# Patient Record
Sex: Female | Born: 1965
Health system: Southern US, Community
[De-identification: ages and names within clinical notes are randomized; demographics above are authoritative.]

## PROBLEM LIST (undated history)

## (undated) DIAGNOSIS — F419 Anxiety disorder, unspecified: Secondary | ICD-10-CM

## (undated) DIAGNOSIS — F329 Major depressive disorder, single episode, unspecified: Secondary | ICD-10-CM

## (undated) DIAGNOSIS — M1990 Unspecified osteoarthritis, unspecified site: Secondary | ICD-10-CM

## (undated) DIAGNOSIS — D649 Anemia, unspecified: Secondary | ICD-10-CM

## (undated) DIAGNOSIS — T4145XA Adverse effect of unspecified anesthetic, initial encounter: Secondary | ICD-10-CM

## (undated) DIAGNOSIS — J3089 Other allergic rhinitis: Secondary | ICD-10-CM

## (undated) DIAGNOSIS — T8859XA Other complications of anesthesia, initial encounter: Secondary | ICD-10-CM

## (undated) DIAGNOSIS — G43909 Migraine, unspecified, not intractable, without status migrainosus: Secondary | ICD-10-CM

## (undated) DIAGNOSIS — D259 Leiomyoma of uterus, unspecified: Secondary | ICD-10-CM

## (undated) DIAGNOSIS — I7300 Raynaud's syndrome without gangrene: Secondary | ICD-10-CM

## (undated) DIAGNOSIS — R112 Nausea with vomiting, unspecified: Secondary | ICD-10-CM

## (undated) DIAGNOSIS — I83223 Varicose veins of left lower extremity with both ulcer of ankle and inflammation: Secondary | ICD-10-CM

## (undated) DIAGNOSIS — R42 Dizziness and giddiness: Secondary | ICD-10-CM

## (undated) DIAGNOSIS — L97329 Non-pressure chronic ulcer of left ankle with unspecified severity: Secondary | ICD-10-CM

## (undated) DIAGNOSIS — L508 Other urticaria: Secondary | ICD-10-CM

## (undated) DIAGNOSIS — Z9889 Other specified postprocedural states: Secondary | ICD-10-CM

## (undated) DIAGNOSIS — M810 Age-related osteoporosis without current pathological fracture: Secondary | ICD-10-CM

## (undated) DIAGNOSIS — K219 Gastro-esophageal reflux disease without esophagitis: Secondary | ICD-10-CM

## (undated) HISTORY — DX: Varicose veins of left lower extremity with both ulcer of ankle and inflammation: I83.223

## (undated) HISTORY — DX: Anemia, unspecified: D64.9

## (undated) HISTORY — DX: Other urticaria: L50.8

## (undated) HISTORY — DX: Leiomyoma of uterus, unspecified: D25.9

## (undated) HISTORY — PX: DENTAL SURGERY: SHX609

## (undated) HISTORY — DX: Major depressive disorder, single episode, unspecified: F32.9

## (undated) HISTORY — DX: Non-pressure chronic ulcer of left ankle with unspecified severity: L97.329

## (undated) HISTORY — PX: TUBAL LIGATION: SHX77

## (undated) HISTORY — PX: TONSILLECTOMY: SUR1361

## (undated) HISTORY — DX: Age-related osteoporosis without current pathological fracture: M81.0

## (undated) HISTORY — PX: HYSTEROSCOPY: SHX211

## (undated) HISTORY — DX: Raynaud's syndrome without gangrene: I73.00

## (undated) HISTORY — DX: Migraine, unspecified, not intractable, without status migrainosus: G43.909

## (undated) HISTORY — DX: Anxiety disorder, unspecified: F41.9

## (undated) HISTORY — PX: ADENOIDECTOMY: SUR15

---
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
## 2010-11-01 ENCOUNTER — Ambulatory Visit: Payer: Self-pay | Admitting: Internal Medicine

## 2010-11-18 ENCOUNTER — Ambulatory Visit: Payer: Self-pay | Admitting: Family Medicine

## 2010-11-18 DIAGNOSIS — J069 Acute upper respiratory infection, unspecified: Secondary | ICD-10-CM | POA: Insufficient documentation

## 2010-12-02 ENCOUNTER — Encounter (INDEPENDENT_AMBULATORY_CARE_PROVIDER_SITE_OTHER): Payer: Self-pay | Admitting: *Deleted

## 2010-12-08 ENCOUNTER — Ambulatory Visit: Payer: Self-pay | Admitting: Gastroenterology

## 2010-12-30 ENCOUNTER — Other Ambulatory Visit: Payer: Self-pay | Admitting: Internal Medicine

## 2010-12-30 ENCOUNTER — Ambulatory Visit
Admission: RE | Admit: 2010-12-30 | Discharge: 2010-12-30 | Payer: Self-pay | Source: Home / Self Care | Attending: Internal Medicine | Admitting: Internal Medicine

## 2010-12-30 LAB — BASIC METABOLIC PANEL
BUN: 18 mg/dL (ref 6–23)
CO2: 21 mEq/L (ref 19–32)
Calcium: 9 mg/dL (ref 8.4–10.5)
Chloride: 108 mEq/L (ref 96–112)
Creatinine, Ser: 0.7 mg/dL (ref 0.4–1.2)
GFR: 92.35 mL/min (ref >60.00)
Glucose, Bld: 79 mg/dL (ref 70–99)
Potassium: 4.7 mEq/L (ref 3.5–5.1)
Sodium: 136 mEq/L (ref 135–145)

## 2010-12-30 LAB — CONVERTED CEMR LAB
Bilirubin Urine: NEGATIVE
Blood in Urine, dipstick: NEGATIVE
Glucose, Urine, Semiquant: NEGATIVE
Ketones, urine, test strip: NEGATIVE
Nitrite: NEGATIVE
Protein, U semiquant: NEGATIVE
Specific Gravity, Urine: 1.02
Urobilinogen, UA: 0.2
WBC Urine, dipstick: NEGATIVE
pH: 6

## 2010-12-30 LAB — LIPID PANEL
Cholesterol: 162 mg/dL (ref 0–200)
HDL: 53.7 mg/dL (ref >39.00)
LDL Cholesterol: 99 mg/dL (ref 0–99)
Total CHOL/HDL Ratio: 3
Triglycerides: 48 mg/dL (ref 0.0–149.0)
VLDL: 9.6 mg/dL (ref 0.0–40.0)

## 2010-12-30 LAB — HEPATIC FUNCTION PANEL
ALT: 15 U/L (ref 0–35)
AST: 16 U/L (ref 0–37)
Albumin: 4.1 g/dL (ref 3.5–5.2)
Alkaline Phosphatase: 82 U/L (ref 39–117)
Bilirubin, Direct: 0.1 mg/dL (ref 0.0–0.3)
Total Bilirubin: 0.5 mg/dL (ref 0.3–1.2)
Total Protein: 7.1 g/dL (ref 6.0–8.3)

## 2010-12-30 LAB — CBC WITH DIFFERENTIAL/PLATELET
Basophils Absolute: 0 10*3/uL (ref 0.0–0.1)
Basophils Relative: 0.6 % (ref 0.0–3.0)
Eosinophils Absolute: 0 10*3/uL (ref 0.0–0.7)
Eosinophils Relative: 0.6 % (ref 0.0–5.0)
HCT: 36.3 % (ref 36.0–46.0)
Hemoglobin: 12.7 g/dL (ref 12.0–15.0)
Lymphocytes Relative: 36.2 % (ref 12.0–46.0)
Lymphs Abs: 1.6 10*3/uL (ref 0.7–4.0)
MCHC: 34.9 g/dL (ref 30.0–36.0)
MCV: 89.1 fl (ref 78.0–100.0)
Monocytes Absolute: 0.3 10*3/uL (ref 0.1–1.0)
Monocytes Relative: 7.1 % (ref 3.0–12.0)
Neutro Abs: 2.4 10*3/uL (ref 1.4–7.7)
Neutrophils Relative %: 55.5 % (ref 43.0–77.0)
Platelets: 258 10*3/uL (ref 150.0–400.0)
RBC: 4.08 Mil/uL (ref 3.87–5.11)
RDW: 14.1 % (ref 11.5–14.6)
WBC: 4.3 10*3/uL — ABNORMAL LOW (ref 4.5–10.5)

## 2010-12-30 LAB — TSH: TSH: 1.48 u[IU]/mL (ref 0.35–5.50)

## 2011-01-06 ENCOUNTER — Encounter: Payer: Self-pay | Admitting: Internal Medicine

## 2011-01-06 ENCOUNTER — Ambulatory Visit
Admission: RE | Admit: 2011-01-06 | Discharge: 2011-01-06 | Payer: Self-pay | Source: Home / Self Care | Attending: Internal Medicine | Admitting: Internal Medicine

## 2011-01-06 DIAGNOSIS — I7300 Raynaud's syndrome without gangrene: Secondary | ICD-10-CM

## 2011-01-06 HISTORY — DX: Raynaud's syndrome without gangrene: I73.00

## 2011-01-18 NOTE — Assessment & Plan Note (Signed)
Summary: TO BE EST/OK PER DOC/NJR   Vital Signs:  Patient profile:   45 year old female Height:      61 inches Weight:      108 pounds BMI:     20.48 Temp:     98.7 degrees F oral Pulse rate:   52 / minute Pulse rhythm:   regular Resp:     12 per minute BP sitting:   106 / 70  Vitals Entered By: Lynann Beaver CMA AAMA (November 01, 2010 2:36 PM) CC: To be established .......main complaint is migraines, Headache Is Patient Diabetic? No Pain Assessment Patient in pain? no       Does patient need assistance? Functional Status Self care Ambulation Normal Comments Pt complains of episodes of anemia.   Primary Care Provider:  Darryll Capers  CC:  To be established .......main complaint is migraines and Headache.  History of Present Illness: to establish transfering from [facility] New pt Hx of migraines, hx of anemia ( in the past) last diagnosed three years ago The pt has a hysteroscopy ( Dr Deatra Canter GYN) last pap was three year ago wil plan a pap at [facility] migraine pattern is perimentrual    Headache HPI:      The patient comes in for chronic management of headaches which have been unstable.  Since the last visit, the frequency of headaches have increased, and the intensity of the headaches have increased.  She has approximately 3 headaches per month.        The location of the headaches are unilateral-right.  Headache quality is throbbing or pulsating.  Precipitating factors consist of periovulatory.        Additional history: The week before mentraul cycle.     Preventive Screening-Counseling & Management  Alcohol-Tobacco     Smoking Status: never     Tobacco Counseling: not indicated; no tobacco use  Caffeine-Diet-Exercise     Does Patient Exercise: no      Drug Use:  no.    Problems Prior to Update: 1)  Family History of Colon Ca 1st Degree Relative <60  (ICD-V16.0) 2)  Anemia-nos  (ICD-285.9) 3)  Headache  (ICD-784.0)  Medications Prior to  Update: 1)  None  Current Medications (verified): 1)  Zomig 5 Mg Soln (Zolmitriptan) .... As Directed 2)  Topamax 50 Mg Tabs (Topiramate) .... One By Mouth Bid 3)  Skelaxin 800 Mg Tabs (Metaxalone) .... Prn 4)  Fluoxetine Hcl 10 Mg Caps (Fluoxetine Hcl) .... One By Mouth  10 Days Pre Menstraul Cycle For 14 Days 5)  Relpax 40 Mg Tabs (Eletriptan Hydrobromide) .... One By Mouth As Needed Head Aches  Allergies (verified): No Known Drug Allergies  Past History:  Family History: Last updated: 11/01/2010 Family History of Colon CA 1st degree relative <60 Family History of Cardiovascular disorder Family History Hypertension Family History of Arthritis  Social History: Last updated: 11/01/2010 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 Smoked Alcohol use-no Drug use-no Regular exercise-no  Risk Factors: Exercise: no (11/01/2010)  Risk Factors: Smoking Status: never (11/01/2010)  Past medical, surgical, family and social histories (including risk factors) reviewed, and no changes noted (except as noted below).  Past Medical History: Headache Anemia-NOS family hx of colon cancer in Maternal Grandmother  Past Surgical History: Tonsillectomy Caesarean section x 2 Hysteroscopy Tubal ligation  Family History: Reviewed history and no changes required. Family History of Colon CA 1st degree relative <60 Family History of Cardiovascular disorder Family History Hypertension Family History of  Arthritis  Social History: Reviewed history and no changes required. 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 Smoked Alcohol use-no Drug use-no Regular exercise-no Smoking Status:  never Drug Use:  no Does Patient Exercise:  no  Review of Systems  The patient denies anorexia, fever, weight loss, weight gain, vision loss, decreased hearing, hoarseness, chest pain, syncope, dyspnea on exertion, peripheral edema, prolonged cough, headaches, hemoptysis, abdominal pain, melena,  hematochezia, severe indigestion/heartburn, hematuria, incontinence, genital sores, muscle weakness, suspicious skin lesions, transient blindness, difficulty walking, depression, unusual weight change, abnormal bleeding, enlarged lymph nodes, angioedema, and breast masses.    Physical Exam  General:  alert, well-developed, and well-nourished.   Head:  normocephalic and atraumatic.   Eyes:  pupils equal and pupils round.   Ears:  R ear normal and L ear normal.   Nose:  no external deformity and no nasal discharge.   Neck:  No deformities, masses, or tenderness noted. Lungs:  normal respiratory effort and no crackles.   Heart:  normal rate and regular rhythm.   Abdomen:  Bowel sounds positive,abdomen soft and non-tender without masses, organomegaly or hernias noted. Msk:  No deformity or scoliosis noted of thoracic or lumbar spine.   Extremities:  No clubbing, cyanosis, edema, or deformity noted with normal full range of motion of all joints.   Neurologic:  alert & oriented X3.     Impression & Recommendations:  Problem # 1:  HEADACHE (ICD-784.0) has failed imitrex in the  past classic migrane with possible periouvlatory migraines on topamax 50 my two times a day but as a prain fog with this Her updated medication list for this problem includes:    Zomig 5 Mg Soln (Zolmitriptan) .Marland Kitchen... As directed    Relpax 40 Mg Tabs (Eletriptan hydrobromide) ..... One by mouth as needed head aches  Headache diary reviewed.  Problem # 2:  ANEMIA-NOS (ICD-285.9) hx of anemia ( iron) and family hx of colon cancer in grandmother Orders: Gastroenterology Referral (GI)  Complete Medication List: 1)  Zomig 5 Mg Soln (Zolmitriptan) .... As directed 2)  Topamax 50 Mg Tabs (Topiramate) .... One by mouth bid 3)  Skelaxin 800 Mg Tabs (Metaxalone) .... Prn 4)  Fluoxetine Hcl 10 Mg Caps (Fluoxetine hcl) .... One by mouth  10 days pre menstraul cycle for 14 days 5)  Relpax 40 Mg Tabs (Eletriptan hydrobromide)  .... One by mouth as needed head aches  Patient Instructions: 1)  [facility] Prescriptions: RELPAX 40 MG TABS (ELETRIPTAN HYDROBROMIDE) one by mouth as needed Head aches  #9 x 6   Entered and Authorized by:   Stacie Glaze MD   Signed by:   Stacie Glaze MD on 11/01/2010   Method used:   Electronically to        [facility] [phone]* (retail)       [address]       [place], [place]  [address]       Ph: [phone]       Fax: [phone]   RxID:   [id number] FLUOXETINE HCL 10 MG CAPS (FLUOXETINE HCL) one by mouth  10 days pre menstraul cycle for 14 days  #14 x 11   Entered and Authorized by:   Stacie Glaze MD   Signed by:   Stacie Glaze MD on 11/01/2010   Method used:   Electronically to        [facility] [phone]* (retail)       [address]  [place], [place]  [address]       Ph: [phone]       Fax: [phone]   RxID:   [id number]    Orders Added: 1)  Gastroenterology Referral [GI] 2)  New Patient Level II [84132]

## 2011-01-18 NOTE — Assessment & Plan Note (Signed)
Summary: FOR SINUS--DR.JENKINS PT//PH   Vital Signs:  Patient profile:   45 year old female Weight:      108.2 pounds O2 Sat:      97 % on Room air Temp:     98.9 degrees F oral Pulse rate:   85 / minute Pulse rhythm:   regular BP sitting:   110 / 70  (right arm) Cuff size:   regular  Vitals Entered By: Almeta Monas CMA Duncan Dull) (November 18, 2010 11:55 AM)  O2 Flow:  Room air CC: Since yesterday pt c/o sneezing, runny nose, post nasal drip, sinus pressure, headache and dry cough, URI symptoms   History of Present Illness:       This is a 45 year old woman who presents with URI symptoms.  The symptoms began 2 weeks ago.  Pt c/o runny nose and sneezing for 2 weeks--progressively worsening and now has sinus headache and dry cough that is worse at night.  The patient complains of nasal congestion, clear nasal discharge, sore throat, dry cough, and sick contacts.  The patient denies fever, low-grade fever (<100.5 degrees), fever of 100.5-103 degrees, fever of 103.1-104 degrees, fever to >104 degrees, stiff neck, dyspnea, wheezing, rash, vomiting, diarrhea, use of an antipyretic, and response to antipyretic.  The patient also reports sneezing.  The patient denies itchy watery eyes, itchy throat, seasonal symptoms, response to antihistamine, headache, muscle aches, and severe fatigue.  The patient denies the following risk factors for Strep sinusitis: unilateral facial pain, unilateral nasal discharge, poor response to decongestant, double sickening, tooth pain, Strep exposure, tender adenopathy, and absence of cough.    Current Medications (verified): 1)  Zomig 5 Mg Soln (Zolmitriptan) .... As Directed 2)  Topamax 50 Mg Tabs (Topiramate) .... One By Mouth Bid 3)  Skelaxin 800 Mg Tabs (Metaxalone) .... Prn 4)  Fluoxetine Hcl 10 Mg Caps (Fluoxetine Hcl) .... One By Mouth  10 Days Pre Menstraul Cycle For 14 Days 5)  Relpax 40 Mg Tabs (Eletriptan Hydrobromide) .... One By Mouth As Needed Head  Aches 6)  Promethazine Hcl 25 Mg Tabs (Promethazine Hcl) .... By Mouth As Needed 7)  Advil Cold/sinus 30-200 Mg Tabs (Pseudoephedrine-Ibuprofen) .Marland Kitchen.. 1 By Mouth Q12hours As Needed 8)  Augmentin 875-125 Mg Tabs (Amoxicillin-Pot Clavulanate) .Marland Kitchen.. 1 By Mouth Two Times A Day 9)  Cheratussin Ac 100-10 Mg/49ml Syrp (Guaifenesin-Codeine) .Marland Kitchen.. 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 Family History of Colon CA 1st degree relative <60 Family History of Cardiovascular disorder Family History Hypertension Family History of Arthritis  Social History: Reviewed history from 11/01/2010 and no changes required. 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  Otoscopic examination reveals clear canals, tympanic membranes are intact bilaterally without bulging, retraction, inflammation or discharge. Hearing is  grossly normal bilaterally. Nose:  no external deformity, no nasal discharge, mucosal erythema, and mucosal edema.   Mouth:  pharyngeal erythema and postnasal drip.   Neck:  No deformities, masses, or tenderness noted. Lungs:  Normal respiratory effort, chest expands symmetrically. Lungs are clear to auscultation, no crackles or wheezes. Heart:  normal rate and no murmur.   Cervical Nodes:  No lymphadenopathy noted Psych:  Cognition and judgment appear intact. Alert and cooperative with normal attention span and concentration. No apparent delusions, illusions, hallucinations   Impression & Recommendations:  Problem # 1:  URI (ICD-465.9)  Her updated medication list for this problem includes:    Promethazine Hcl 25 Mg Tabs (Promethazine hcl) ..... By mouth as needed    Advil Cold/sinus 30-200 Mg Tabs (Pseudoephedrine-ibuprofen) .Marland Kitchen... 1 by mouth q12hours as needed    Cheratussin Ac 100-10 Mg/29ml Syrp (Guaifenesin-codeine) .Marland Kitchen... 1-2 tsp by mouth at bedtime as needed cough  Instructed on symptomatic treatment. Call if symptoms persist or worsen.   Complete Medication List: 1)  Zomig 5 Mg Soln (Zolmitriptan) .... As directed 2)  Topamax 50 Mg Tabs (Topiramate) .... One by mouth bid 3)  Skelaxin 800 Mg Tabs (Metaxalone) .... Prn 4)  Fluoxetine Hcl 10 Mg Caps (Fluoxetine hcl) .... One by mouth  10 days pre menstraul cycle for 14 days 5)  Relpax 40 Mg Tabs (Eletriptan hydrobromide) .... One by mouth as needed head aches 6)  Promethazine Hcl 25 Mg Tabs (Promethazine hcl) .... By mouth as needed 7)  Advil Cold/sinus 30-200 Mg Tabs (Pseudoephedrine-ibuprofen) .Marland Kitchen.. 1 by mouth q12hours as needed 8)  Augmentin 875-125 Mg Tabs (Amoxicillin-pot clavulanate) .Marland Kitchen.. 1 by mouth two times a day 9)  Cheratussin Ac 100-10 Mg/84ml Syrp (Guaifenesin-codeine) .Marland Kitchen.. 1-2 tsp by mouth at bedtime as needed cough  Patient Instructions: 1)  Get plenty of rest, drink lots of clear liquids, and use Tylenol or Ibuprofen  for fever and comfort. Return in 7-10 days if you're not better: sooner if you'er feeling worse. ------ use claritin or allegra etc for drainage and mucinex for cough Prescriptions: CHERATUSSIN AC 100-10 MG/5ML SYRP (GUAIFENESIN-CODEINE) 1-2 tsp by mouth at bedtime as needed cough  #6 oz x 0   Entered and Authorized by:   Loreen Freud DO   Signed by:   Loreen Freud DO on 11/18/2010   Method used:   Print then Give to Patient   RxID:   [id number] AUGMENTIN 875-125 MG TABS (AMOXICILLIN-POT CLAVULANATE) 1 by mouth two times a day  #20 x 0   Entered and Authorized by:   Loreen Freud DO   Signed by:   Loreen Freud DO on 11/18/2010   Method used:   Print then Give to Patient   RxID:   [id number]    Orders Added: 1)  Est. Patient Level III [47425]

## 2011-01-20 NOTE — Assessment & Plan Note (Signed)
Summary: [facility]-? PAP//CCM   Vital Signs:  Patient profile:   45 year old female Height:      61 inches Weight:      110 pounds BMI:     20.86 Temp:     98.3 degrees F oral Pulse rate:   59 / minute Resp:     14 per minute BP sitting:   120 / 70  (left arm)  Vitals Entered By: Willy Eddy, LPN (January 06, 2011 10:27 AM) CC: cpx-no pap or mammogram and several years- referral sent to dr Henderson Cloud-- Is Patient Diabetic? No   Primary Care Provider:  Darryll Capers  CC:  cpx-no pap or mammogram and several years- referral sent to dr Henderson Cloud--.  History of Present Illness: The pt was asked about all immunizations, health maint. services that are appropriate to their age and was given guidance on diet exercize  and weight management  cold feet with cramping in toes non smoker no worse wth use of relpax no wosening ( chronic)  Preventive Screening-Counseling & Management  Alcohol-Tobacco     Smoking Status: never     Tobacco Counseling: not indicated; no tobacco use  Problems Prior to Update: 1)  Health Maintenance Exam  (ICD-V70.0) 2)  Uri  (ICD-465.9) 3)  Family History of Colon Ca 1st Degree Relative <60  (ICD-V16.0) 4)  Anemia-nos  (ICD-285.9) 5)  Headache  (ICD-784.0)  Current Problems (verified): 1)  Uri  (ICD-465.9) 2)  Family History of Colon Ca 1st Degree Relative <60  (ICD-V16.0) 3)  Anemia-nos  (ICD-285.9) 4)  Headache  (ICD-784.0)  Medications Prior to Update: 1)  Zomig 5 Mg Soln (Zolmitriptan) .... As Directed 2)  Topamax 50 Mg Tabs (Topiramate) .... One By Mouth Bid 3)  Skelaxin 800 Mg Tabs (Metaxalone) .... Prn 4)  Fluoxetine Hcl 10 Mg Caps (Fluoxetine Hcl) .... One By Mouth  10 Days Pre Menstraul Cycle For 14 Days 5)  Relpax 40 Mg Tabs (Eletriptan Hydrobromide) .... One By Mouth As Needed Head Aches 6)  Promethazine Hcl 25 Mg Tabs (Promethazine Hcl) .... By Mouth As Needed 7)  Advil Cold/sinus 30-200 Mg Tabs (Pseudoephedrine-Ibuprofen) .Marland Kitchen.. 1 By Mouth  Q12hours As Needed 8)  Cheratussin Ac 100-10 Mg/83ml Syrp (Guaifenesin-Codeine) .Marland Kitchen.. 1-2 Tsp By Mouth At Bedtime As Needed Cough  Current Medications (verified): 1)  Topamax 50 Mg Tabs (Topiramate) .... One By Mouth Bid 2)  Fluoxetine Hcl 10 Mg Caps (Fluoxetine Hcl) .... One By Mouth  10 Days Pre Menstraul Cycle For 14 Days 3)  Relpax 40 Mg Tabs (Eletriptan Hydrobromide) .... One By Mouth As Needed Head Aches 4)  Promethazine Hcl 25 Mg Tabs (Promethazine Hcl) .... By Mouth As Needed 5)  Advil Cold/sinus 30-200 Mg Tabs (Pseudoephedrine-Ibuprofen) .Marland Kitchen.. 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  Past Medical History: Reviewed history from 11/01/2010 and no changes required. Headache Anemia-NOS family hx of colon cancer in Maternal Grandmother  Past Surgical History: Reviewed history from 11/01/2010 and no changes required.  Tonsillectomy Caesarean section x 2 Hysteroscopy Tubal ligation  Family History: Reviewed history from 11/01/2010 and no changes required. Family History of Colon CA 1st degree relative <60 Family History of Cardiovascular disorder Family History Hypertension Family History of Arthritis  Social History: Reviewed history from 11/01/2010 and no changes required. 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         needs referral to GYN  Physical Exam  General:  Well-developed,well-nourished,in no acute distress; alert,appropriate and cooperative throughout examination Head:  normocephalic and atraumatic.   Eyes:  pupils equal and pupils round.   Ears:  R ear normal and L ear normal.   Nose:  no external deformity, no nasal discharge, mucosal erythema, and mucosal edema.   Mouth:  pharyngeal erythema and postnasal drip.   Neck:  No deformities, masses, or tenderness noted. Lungs:  Normal respiratory effort, chest expands symmetrically. Lungs are clear to auscultation, no crackles or wheezes. Heart:  normal rate and no murmur.   Abdomen:  Bowel sounds positive,abdomen soft and non-tender without masses, organomegaly or hernias noted. Msk:  No deformity or scoliosis noted of thoracic or lumbar spine.   Pulses:  R and L carotid,radial,femoral,dorsalis pedis and posterior tibial pulses are full and equal bilaterally Extremities:  cool feet with normal cap refill Neurologic:  alert & oriented X3, gait normal, and DTRs symmetrical and normal.   Skin:  Intact without suspicious lesions or rashes Cervical Nodes:  No lymphadenopathy noted Axillary Nodes:  No palpable lymphadenopathy Psych:  Cognition and judgment appear intact. Alert and cooperative with normal attention span and concentration. No apparent delusions, illusions, hallucinations   Impression &  Recommendations:  Problem # 1:  HEALTH MAINTENANCE EXAM (ICD-V70.0) Assessment Improved  The pt was asked about all immunizations, health maint. services that are appropriate to their age and was given guidance on diet exercize  and weight management  Orders: Gynecologic Referral (Gyn) EKG w/ Interpretation (93000)  Td Booster: Historical (09/20/2005)   Chol: 162 (12/30/2010)   HDL: 53.70 (12/30/2010)   LDL: 99 (12/30/2010)   TG: 48.0 (12/30/2010) TSH: 1.48 (12/30/2010)    Discussed using sunscreen, use of alcohol, drug use, self breast exam, routine dental care, routine eye care, schedule for GYN exam, routine physical exam, seat belts, multiple vitamins, osteoporosis prevention, adequate calcium intake in diet, recommendations for immunizations, mammograms and Pap smears.  Discussed exercise and checking cholesterol.  Discussed gun safety, safe sex, and contraception.  Problem # 2:  RAYNAUDS SYNDROME (ICD-443.0) Assessment: New lower ext prominant add ASA 325 by mouth daily  Problem # 3:  HEADACHE (ICD-784.0) Assessment: Unchanged  The following medications were removed from the medication list:    Zomig 5 Mg Soln (Zolmitriptan) .Marland Kitchen... As directed Her updated medication list for this problem includes:    Relpax 40 Mg Tabs (Eletriptan hydrobromide) ..... One by mouth as needed head aches  Headache diary reviewed.  Complete Medication List: 1)  Topamax 50 Mg Tabs (Topiramate) .... One by mouth bid 2)  Fluoxetine Hcl 10 Mg Caps (Fluoxetine hcl) .... One by mouth  10 days pre menstraul  cycle for 14 days 3)  Relpax 40 Mg Tabs (Eletriptan hydrobromide) .... One by mouth as needed head aches 4)  Promethazine Hcl 25 Mg Tabs (Promethazine hcl) .... By mouth as needed 5)  Advil Cold/sinus 30-200 Mg Tabs (Pseudoephedrine-ibuprofen) .Marland Kitchen.. 1 by mouth q12hours as needed  Patient Instructions: 1)  Please schedule a follow-up appointment in 6 months. 2)  The condition in your feet is probably  Raynauds condition 3)  take a full coated aspirin daily 4)  Keep up with weight and exercized... you are doing well Prescriptions: TOPAMAX 50 MG TABS (TOPIRAMATE) one by mouth bid  #60 x 3   Entered by:   Willy Eddy, LPN   Authorized by:   Stacie Glaze MD   Signed by:   Willy Eddy, LPN on 16/09/9603   Method used:   Electronically to        [facility] [phone] (retail)       [address]       [place], [place]  [address]       Ph: [phone]       Fax: [phone]   RxID:   [id number]    Orders Added: 1)  Gynecologic Referral [Gyn] 2)  EKG w/ Interpretation [93000] 3)  Est. Patient 40-64 years [99396] 4)  Est. Patient Level II [25366]   Immunization History:  Tetanus/Td Immunization History:    Tetanus/Td:  historical (09/20/2005)   Immunization History:  Tetanus/Td Immunization History:    Tetanus/Td:  Historical (09/20/2005)

## 2011-01-20 NOTE — Letter (Signed)
Summary: Pre Visit Letter Revised  [facility]  [address] [place], [place] [address]   Phone: [phone]  Fax: [phone]        12/02/2010 MRN: [id number] [facility] [address] [place], [place]  [address]             Procedure Date:  12/22/2010   Welcome to the Gastroenterology Division at [facility].    You are scheduled to see a nurse for your pre-procedure visit on 12/08/2010 at 4:30 PM on the 3rd floor at [facility], [address]  We ask that you try to arrive at our office 15 minutes prior to your appointment time to allow for check-in.  Please take a minute to review the attached form.  If you answer "Yes" to one or more of the questions on the first page, we ask that you call the person listed at your earliest opportunity.  If you answer "No" to all of the questions, please complete the rest of the form and bring it to your appointment.    Your nurse visit will consist of discussing your medical and surgical history, your immediate family medical history, and your medications.   If you are unable to list all of your medications on the form, please bring the medication bottles to your appointment and we will list them.  We will need to be aware of both prescribed and over the counter drugs.  We will need to know exact dosage information as well.    Please be prepared to read and sign documents such as consent forms, a financial agreement, and acknowledgement forms.  If necessary, and with your consent, a friend or relative is welcome to sit-in on the nurse visit with you.  Please bring your insurance card so that we may make a copy of it.  If your insurance requires a referral to see a specialist, please bring your referral form from your primary care physician.  No co-pay is required for this nurse visit.     If you cannot keep your appointment, please call [phone] to cancel or reschedule prior to your appointment date.  This  allows [place] the opportunity to schedule an appointment for another patient in need of care.    Thank you for choosing [facility] for your medical needs.  We appreciate the opportunity to care for you.  Please visit [place] at our website  to learn more about our practice.  Sincerely, The Gastroenterology Division

## 2011-01-24 ENCOUNTER — Telehealth: Payer: Self-pay | Admitting: *Deleted

## 2011-01-24 NOTE — Telephone Encounter (Signed)
Concerned about allergic reaction to one of her meds?   LMTCB with more information.

## 2011-01-25 ENCOUNTER — Telehealth: Payer: Self-pay | Admitting: *Deleted

## 2011-01-25 NOTE — Telephone Encounter (Signed)
Left another message to call back with update on condition.

## 2011-01-26 ENCOUNTER — Telehealth: Payer: Self-pay | Admitting: *Deleted

## 2011-01-26 NOTE — Telephone Encounter (Signed)
Pt is complaining of rash and severe itching all over and has been on Paroxitine x one month.

## 2011-01-26 NOTE — Telephone Encounter (Signed)
Pt thinks she is having a reaction to Fluoxetine????  Her phone is not working and cannot hear her.

## 2011-01-26 NOTE — Telephone Encounter (Signed)
benadry tonight and add on in AM

## 2011-01-27 NOTE — Telephone Encounter (Signed)
Talked with pt and she is going to take benadryl and hold med she thinks is causing break and will let us know- refused ov

## 2011-03-21 ENCOUNTER — Other Ambulatory Visit: Payer: Self-pay | Admitting: *Deleted

## 2011-03-21 DIAGNOSIS — R51 Headache: Secondary | ICD-10-CM

## 2011-03-21 MED ORDER — ELETRIPTAN HYDROBROMIDE 40 MG PO TABS: ORAL_TABLET | ORAL | Status: DC

## 2011-03-21 MED ORDER — ELETRIPTAN HYDROBROMIDE 40 MG PO TABS: 40.0000 mg | ORAL_TABLET | ORAL | Status: DC | PRN

## 2011-04-12 ENCOUNTER — Ambulatory Visit (INDEPENDENT_AMBULATORY_CARE_PROVIDER_SITE_OTHER): Payer: 59 | Admitting: Internal Medicine

## 2011-04-12 ENCOUNTER — Encounter: Payer: Self-pay | Admitting: Internal Medicine

## 2011-04-12 DIAGNOSIS — R05 Cough: Secondary | ICD-10-CM

## 2011-04-12 DIAGNOSIS — D649 Anemia, unspecified: Secondary | ICD-10-CM

## 2011-04-12 DIAGNOSIS — R059 Cough, unspecified: Secondary | ICD-10-CM | POA: Insufficient documentation

## 2011-04-12 DIAGNOSIS — R51 Headache: Secondary | ICD-10-CM

## 2011-04-12 LAB — CBC WITH DIFFERENTIAL/PLATELET
Basophils Relative: 0.4 % (ref 0.0–3.0)
Eosinophils Relative: 0.9 % (ref 0.0–5.0)
Hemoglobin: 13.2 g/dL (ref 12.0–15.0)
Lymphocytes Relative: 44.9 % (ref 12.0–46.0)
MCV: 89.4 fl (ref 78.0–100.0)
Neutro Abs: 2.1 10*3/uL (ref 1.4–7.7)
Neutrophils Relative %: 47.2 % (ref 43.0–77.0)
RBC: 4.27 Mil/uL (ref 3.87–5.11)
WBC: 4.5 10*3/uL (ref 4.5–10.5)

## 2011-04-12 MED ORDER — BENZONATATE 100 MG PO CAPS: 100.0000 mg | ORAL_CAPSULE | Freq: Three times a day (TID) | ORAL | Status: AC | PRN

## 2011-04-12 MED ORDER — LEVOFLOXACIN 500 MG PO TABS: 500.0000 mg | ORAL_TABLET | Freq: Every day | ORAL | Status: AC

## 2011-04-12 NOTE — Progress Notes (Signed)
  Subjective:    Patient ID: Crystal Dillon, female    DOB: [birth year], 45 y.o.   MRN: [id number]  HPI Patient is a 45 year old white female presents for an acute visit with chronic cough she states has been going on for several weeks not associated with any production of sputum fever or chills she does have sinus congestion but no sore throat she has a history of migraine headaches she is using more than before a lighted Relpax a month but this has been her chronic pattern is a history of anemia and is due for CBC   Review of Systems     Objective:   Physical Exam        Assessment & Plan:

## 2011-04-12 NOTE — Assessment & Plan Note (Signed)
Headache pattern is still ovulatory or hormonally mediated they occur the week before what would be her period although her periods are intermittent and rare

## 2011-04-12 NOTE — Progress Notes (Signed)
  Subjective:    Patient ID: Crystal Dillon, female    DOB: November 24, 1966, 45 y.o.   MRN: [id number]  HPI    Review of Systems  Constitutional: Negative for activity change, appetite change and fatigue.  HENT: Positive for congestion, rhinorrhea and postnasal drip. Negative for ear pain, neck pain and sinus pressure.   Eyes: Negative for redness and visual disturbance.  Respiratory: Negative for cough, choking, chest tightness, shortness of breath and wheezing.   Gastrointestinal: Negative for abdominal pain and abdominal distention.  Genitourinary: Negative for dysuria, frequency and menstrual problem.  Musculoskeletal: Negative for myalgias, joint swelling and arthralgias.  Skin: Negative for rash and wound.  Neurological: Negative for dizziness, weakness and headaches.  Hematological: Negative for adenopathy. Does not bruise/bleed easily.  Psychiatric/Behavioral: Negative for sleep disturbance and decreased concentration.       Past Medical History  Diagnosis Date  . Headache   . Anemia    Past Surgical History  Procedure Date  . Tonsillectomy   . Cesarean section   . Abdominal hysterectomy   . Tubal ligation     reports that she has never smoked. She does not have any smokeless tobacco history on file. She reports that she does not drink alcohol or use illicit drugs. family history includes COPD in her mother; Cancer in her father; Colon cancer in an unspecified family member; Diabetes in her father and mother; and Hyperlipidemia in her mother. Allergies  Allergen Reactions  . Prozac (Fluoxetine Hcl) Rash    Objective:   Physical Exam  Constitutional: She is oriented to person, place, and time. She appears well-developed and well-nourished. No distress.  HENT:  Head: Normocephalic and atraumatic.  Right Ear: External ear normal.  Left Ear: External ear normal.  Nose: Nose normal.  Mouth/Throat: Oropharynx is clear and moist.  Eyes: Conjunctivae and EOM are normal.  Pupils are equal, round, and reactive to light.  Neck: Normal range of motion. Neck supple. No JVD present. No tracheal deviation present. No thyromegaly present.  Cardiovascular: Normal rate, regular rhythm, normal heart sounds and intact distal pulses.   No murmur heard. Pulmonary/Chest: Effort normal and breath sounds normal. She has no wheezes. She has no rales. She exhibits no tenderness.  Abdominal: Soft. Bowel sounds are normal.  Musculoskeletal: Normal range of motion. She exhibits no edema and no tenderness.  Lymphadenopathy:    She has no cervical adenopathy.  Neurological: She is alert and oriented to person, place, and time. She has normal reflexes. No cranial nerve deficit.  Skin: Skin is warm and dry. She is not diaphoretic.  Psychiatric: She has a normal mood and affect. Her behavior is normal.          Assessment & Plan:

## 2011-04-12 NOTE — Assessment & Plan Note (Signed)
The patient has a history of anemia we will obtain a white count today do to her chronic cough we will also obtain a hemoglobin and hematocrit to monitor her anemia

## 2011-04-12 NOTE — Assessment & Plan Note (Signed)
On physical examination she has symptoms of right maxillary frontal sinusitis with eustachian tube dysfunction on the right and marked posterior nasal drip we will treat her for acute sinusitis and gave her cough medicine to control cough

## 2011-07-05 ENCOUNTER — Encounter: Payer: Self-pay | Admitting: Internal Medicine

## 2011-07-07 ENCOUNTER — Encounter: Payer: Self-pay | Admitting: Internal Medicine

## 2011-07-07 ENCOUNTER — Ambulatory Visit (INDEPENDENT_AMBULATORY_CARE_PROVIDER_SITE_OTHER): Payer: 59 | Admitting: Internal Medicine

## 2011-07-07 VITALS — BP 100/60 | HR 72 | Temp 98.2°F | Resp 16 | Ht 62.0 in | Wt 108.0 lb

## 2011-07-07 DIAGNOSIS — L97329 Non-pressure chronic ulcer of left ankle with unspecified severity: Secondary | ICD-10-CM

## 2011-07-07 DIAGNOSIS — I83223 Varicose veins of left lower extremity with both ulcer of ankle and inflammation: Secondary | ICD-10-CM

## 2011-07-07 DIAGNOSIS — I83219 Varicose veins of right lower extremity with both ulcer of unspecified site and inflammation: Secondary | ICD-10-CM

## 2011-07-07 DIAGNOSIS — I7300 Raynaud's syndrome without gangrene: Secondary | ICD-10-CM

## 2011-07-07 DIAGNOSIS — R51 Headache: Secondary | ICD-10-CM

## 2011-07-07 DIAGNOSIS — L97929 Non-pressure chronic ulcer of unspecified part of left lower leg with unspecified severity: Secondary | ICD-10-CM

## 2011-07-07 DIAGNOSIS — D649 Anemia, unspecified: Secondary | ICD-10-CM

## 2011-07-07 HISTORY — DX: Varicose veins of left lower extremity with both ulcer of ankle and inflammation: I83.223

## 2011-07-07 HISTORY — DX: Varicose veins of left lower extremity with both ulcer of ankle and inflammation: L97.329

## 2011-07-07 LAB — CBC WITH DIFFERENTIAL/PLATELET
Basophils Absolute: 0 10*3/uL (ref 0.0–0.1)
Basophils Relative: 0.5 % (ref 0.0–3.0)
Eosinophils Absolute: 0 10*3/uL (ref 0.0–0.7)
Hemoglobin: 12 g/dL (ref 12.0–15.0)
Lymphs Abs: 1.5 10*3/uL (ref 0.7–4.0)
MCHC: 34.1 g/dL (ref 30.0–36.0)
MCV: 91.5 fl (ref 78.0–100.0)
Monocytes Absolute: 0.3 10*3/uL (ref 0.1–1.0)
Neutro Abs: 1.9 10*3/uL (ref 1.4–7.7)
RBC: 3.86 Mil/uL — ABNORMAL LOW (ref 3.87–5.11)
RDW: 14.5 % (ref 11.5–14.6)

## 2011-07-07 MED ORDER — ASPIRIN EC 81 MG PO TBEC: 81.0000 mg | DELAYED_RELEASE_TABLET | Freq: Every day | ORAL | Status: AC

## 2011-07-07 NOTE — Assessment & Plan Note (Signed)
Monitor CBC and Fe

## 2011-07-07 NOTE — Assessment & Plan Note (Signed)
Encouraged to use a low-dose aspirin daily

## 2011-07-07 NOTE — Progress Notes (Signed)
  Subjective:    Patient ID: Crystal Dillon, female    DOB: 1966-10-20, 45 y.o.   MRN: [id number]  HPI  Patient is a 45 year old white female who presents for followup of chronic migraine headaches some increased fatigue with a history of anemia in a painful area on her left ankle.  She has a history of iron deficiency anemia and she has some progressive weakness we'll monitor her iron levels and CBC today shows a history of migraine headaches but stable pattern of headaches these are primarily related to menstrual cycle.  Otherwise she is doing well blood pressure is stable range and is improved with the summer months  Review of Systems  Constitutional: Negative for activity change, appetite change and fatigue.  HENT: Negative for ear pain, congestion, neck pain, postnasal drip and sinus pressure.   Eyes: Negative for redness and visual disturbance.  Respiratory: Negative for cough, shortness of breath and wheezing.   Gastrointestinal: Negative for abdominal pain and abdominal distention.  Genitourinary: Negative for dysuria, frequency and menstrual problem.  Musculoskeletal: Negative for myalgias, joint swelling and arthralgias.  Skin: Negative for rash and wound.  Neurological: Positive for headaches. Negative for dizziness and weakness.  Hematological: Negative for adenopathy. Does not bruise/bleed easily.  Psychiatric/Behavioral: Negative for sleep disturbance and decreased concentration.       Objective:   Physical Exam  Constitutional: She is oriented to person, place, and time. She appears well-developed and well-nourished. No distress.  HENT:  Head: Normocephalic and atraumatic.  Right Ear: External ear normal.  Left Ear: External ear normal.  Nose: Nose normal.  Mouth/Throat: Oropharynx is clear and moist.  Eyes: Conjunctivae and EOM are normal. Pupils are equal, round, and reactive to light.  Neck: Normal range of motion. Neck supple. No JVD present. No tracheal  deviation present. No thyromegaly present.  Cardiovascular: Normal rate, regular rhythm, normal heart sounds and intact distal pulses.   No murmur heard. Pulmonary/Chest: Effort normal and breath sounds normal. She has no wheezes. She exhibits no tenderness.  Abdominal: Soft. Bowel sounds are normal.  Musculoskeletal: Normal range of motion. She exhibits no edema and no tenderness.  Lymphadenopathy:    She has no cervical adenopathy.  Neurological: She is alert and oriented to person, place, and time. She has normal reflexes. No cranial nerve deficit.  Skin: Skin is warm and dry. She is not diaphoretic.       Thrombophlebitis over the left ankle with soft tissue swelling varicosity palpable of the lower branch the saphenous vein  Psychiatric: She has a normal mood and affect. Her behavior is normal.          Assessment & Plan:  Thrombophlebitis with varicosity referred to interventional radiology for potential intervention stable migraine pattern history of iron deficiency anemia with weakness monitor CBC and iron levels treat as appropriate.  Return for complete physical examination January

## 2011-07-07 NOTE — Assessment & Plan Note (Signed)
Referral to interventional radiology for vascular studies. Once the heat of summer is over would encourage 3 months of compression stockings and consider peripheral intervention for the varicosity

## 2011-07-07 NOTE — Assessment & Plan Note (Signed)
The pt has a  a stable frequency of headaches they're between 7 headaches per month they still resolve with the use of her migraine medication. The pattern appears to be worse during ovulation prior to menses.  Samples of her medication given her chin updated as needed

## 2011-07-08 ENCOUNTER — Telehealth: Payer: Self-pay | Admitting: Internal Medicine

## 2011-07-08 DIAGNOSIS — I83219 Varicose veins of right lower extremity with both ulcer of unspecified site and inflammation: Secondary | ICD-10-CM

## 2011-07-08 NOTE — Telephone Encounter (Signed)
Order has been entered

## 2011-07-08 NOTE — Telephone Encounter (Signed)
Per Tammy @  [facility] this referral needs to be an order

## 2011-07-13 ENCOUNTER — Other Ambulatory Visit: Payer: Self-pay | Admitting: Internal Medicine

## 2011-07-13 ENCOUNTER — Ambulatory Visit
Admission: RE | Admit: 2011-07-13 | Discharge: 2011-07-13 | Disposition: A | Discharge status: Home / Self Care | Payer: 59 | Source: Ambulatory Visit | Attending: Internal Medicine | Admitting: Internal Medicine

## 2011-07-13 VITALS — BP 126/67 | HR 70 | Temp 98.2°F | Resp 14 | Ht 62.0 in | Wt 106.0 lb

## 2011-07-13 DIAGNOSIS — L97919 Non-pressure chronic ulcer of unspecified part of right lower leg with unspecified severity: Secondary | ICD-10-CM

## 2011-07-13 DIAGNOSIS — I83219 Varicose veins of right lower extremity with both ulcer of unspecified site and inflammation: Secondary | ICD-10-CM

## 2011-07-13 DIAGNOSIS — L97929 Non-pressure chronic ulcer of unspecified part of left lower leg with unspecified severity: Secondary | ICD-10-CM

## 2011-07-13 DIAGNOSIS — I83229 Varicose veins of left lower extremity with both ulcer of unspecified site and inflammation: Secondary | ICD-10-CM

## 2011-09-22 ENCOUNTER — Other Ambulatory Visit: Payer: Self-pay | Admitting: Internal Medicine

## 2012-03-26 ENCOUNTER — Other Ambulatory Visit: Payer: Self-pay | Admitting: Internal Medicine

## 2012-04-23 ENCOUNTER — Other Ambulatory Visit: Payer: Self-pay | Admitting: *Deleted

## 2012-04-23 ENCOUNTER — Ambulatory Visit (INDEPENDENT_AMBULATORY_CARE_PROVIDER_SITE_OTHER): Payer: 59 | Admitting: Internal Medicine

## 2012-04-23 ENCOUNTER — Encounter: Payer: Self-pay | Admitting: Internal Medicine

## 2012-04-23 VITALS — BP 110/70 | HR 72 | Temp 98.2°F | Resp 16 | Ht 64.0 in | Wt 108.0 lb

## 2012-04-23 DIAGNOSIS — D649 Anemia, unspecified: Secondary | ICD-10-CM

## 2012-04-23 DIAGNOSIS — R51 Headache: Secondary | ICD-10-CM

## 2012-04-23 DIAGNOSIS — F419 Anxiety disorder, unspecified: Secondary | ICD-10-CM

## 2012-04-23 DIAGNOSIS — F411 Generalized anxiety disorder: Secondary | ICD-10-CM

## 2012-04-23 MED ORDER — ELETRIPTAN HYDROBROMIDE 40 MG PO TABS: 40.0000 mg | ORAL_TABLET | ORAL | Status: DC | PRN

## 2012-04-23 MED ORDER — ALPRAZOLAM 0.25 MG PO TABS: 0.2500 mg | ORAL_TABLET | Freq: Two times a day (BID) | ORAL | Status: AC | PRN

## 2012-04-23 NOTE — Progress Notes (Signed)
Subjective:    Patient ID: Crystal Dillon, female    DOB: Jun 19, 1966, 46 y.o.   MRN: [id number]  Headache  Pertinent negatives include no abdominal pain, coughing, dizziness, ear pain, eye redness, neck pain, sinus pressure or weakness.    Patient is a 46 year old white female who is followed for history of migraine headaches history of anemia and history of Raynaud's.  She also has history of varicose veins and a history of an ulcer on her ankle.  Patient was on prophylaxis with Topamax and she carefully weaned herself from this medication has noticed no change in the frequency of her headaches.  Her weight and blood pressure stable off this medication and she continues to have a stable migraine pattern for which she uses Relpax is her drug of choice.  She uses approximately 8 tablets a month She has noticed some irregularity of her menstrual cycle and has an appointment with a gynecologist. .  Review of Systems  Constitutional: Negative for activity change, appetite change and fatigue.  HENT: Negative for ear pain, congestion, neck pain, postnasal drip and sinus pressure.   Eyes: Negative for redness and visual disturbance.  Respiratory: Negative for cough, shortness of breath and wheezing.   Gastrointestinal: Negative for abdominal pain and abdominal distention.  Genitourinary: Negative for dysuria, frequency and menstrual problem.  Musculoskeletal: Negative for myalgias, joint swelling and arthralgias.  Skin: Negative for rash and wound.  Neurological: Positive for headaches. Negative for dizziness and weakness.  Hematological: Negative for adenopathy. Does not bruise/bleed easily.  Psychiatric/Behavioral: Negative for sleep disturbance and decreased concentration.   Past Medical History  Diagnosis Date  . Headache   . Anemia   . Headache   . Depression     History   Social History  . Marital Status: Married    Spouse Name: N/A    Number of Children: N/A  . Years of  Education: N/A   Occupational History  . [employer]   . Training and development officer    Social History Main Topics  . Smoking status: Never Smoker   . Smokeless tobacco: Not on file  . Alcohol Use: No  . Drug Use: No  . Sexually Active: Not on file   Other Topics Concern  . Not on file   Social History Narrative  . No narrative on file    Past Surgical History  Procedure Date  . Tonsillectomy   . Cesarean section   . Abdominal hysterectomy   . Tubal ligation   . Tonsillectomy     Family History  Problem Relation Age of Onset  . Colon cancer    . COPD Mother   . Hyperlipidemia Mother   . Diabetes Mother   . Cancer Father     lung  . Diabetes Father     Allergies  Allergen Reactions  . Prozac (Fluoxetine Hcl) Rash    Current Outpatient Prescriptions on File Prior to Visit  Medication Sig Dispense Refill  . promethazine (PHENERGAN) 25 MG tablet Take 25 mg by mouth every 6 (six) hours as needed.          BP 110/70  Pulse 72  Temp 98.2 F (36.8 C)  Resp 16  Ht 5\' 4"  (1.626 m)  Wt 108 lb (48.988 kg)  BMI 18.54 kg/m2       Objective:   Physical Exam  Nursing note and vitals reviewed. Constitutional: She is oriented to person, place, and time. She appears well-developed and well-nourished. No distress.  HENT:  Head:  Normocephalic and atraumatic.  Right Ear: External ear normal.  Left Ear: External ear normal.  Nose: Nose normal.  Mouth/Throat: Oropharynx is clear and moist.  Eyes: Conjunctivae and EOM are normal. Pupils are equal, round, and reactive to light.  Neck: Normal range of motion. Neck supple. No JVD present. No tracheal deviation present. No thyromegaly present.  Cardiovascular: Normal rate, regular rhythm, normal heart sounds and intact distal pulses.   No murmur heard. Pulmonary/Chest: Effort normal and breath sounds normal. She has no wheezes. She exhibits no tenderness.  Abdominal: Soft. Bowel sounds are normal.  Musculoskeletal: Normal range of  motion. She exhibits no edema and no tenderness.  Lymphadenopathy:    She has no cervical adenopathy.  Neurological: She is alert and oriented to person, place, and time. She has normal reflexes. No cranial nerve deficit.  Skin: Skin is warm and dry. She is not diaphoretic.  Psychiatric: She has a normal mood and affect. Her behavior is normal.          Assessment & Plan:     I would recommend we stop that medication at this time she is a perimenopausal. In discussing with her gynecologist with her hormone replacement is appropriate for her irregular menstrual cycles and patient is followed for stable pattern patient   Patient has followed for stable migraine pattern after she's been herself off of the prophylactic medication.  She's had no problems during the weaning process and is now only on the Relpax as needed for migraines she takes approximately 8 month.  I believe she stable and this will not change the protocol with heparin filled the prescription she is in a perimenopausal. Now with irregular and extended menses and is going to talk with her pathologist about appropriate therapy for this if she starts hormone therapy I am recommending that she take a daily hormone replacement rather than a cyclic hormone replacement given her history of migraines

## 2012-04-23 NOTE — Patient Instructions (Signed)
The patient is instructed to continue all medications as prescribed. Schedule followup with check out clerk upon leaving the clinic  

## 2012-10-18 ENCOUNTER — Other Ambulatory Visit (INDEPENDENT_AMBULATORY_CARE_PROVIDER_SITE_OTHER): Payer: 59

## 2012-10-18 DIAGNOSIS — Z0000 Encounter for general adult medical examination without abnormal findings: Secondary | ICD-10-CM

## 2012-10-18 LAB — CBC WITH DIFFERENTIAL/PLATELET
Eosinophils Relative: 1.4 % (ref 0.0–5.0)
MCV: 92.2 fl (ref 78.0–100.0)
Monocytes Absolute: 0.3 10*3/uL (ref 0.1–1.0)
Neutrophils Relative %: 55.2 % (ref 43.0–77.0)
Platelets: 249 10*3/uL (ref 150.0–400.0)
WBC: 4 10*3/uL — ABNORMAL LOW (ref 4.5–10.5)

## 2012-10-18 LAB — LIPID PANEL
Cholesterol: 162 mg/dL (ref 0–200)
LDL Cholesterol: 94 mg/dL (ref 0–99)
Total CHOL/HDL Ratio: 3
Triglycerides: 55 mg/dL (ref 0.0–149.0)

## 2012-10-18 LAB — BASIC METABOLIC PANEL
BUN: 15 mg/dL (ref 6–23)
Creatinine, Ser: 0.7 mg/dL (ref 0.4–1.2)
GFR: 94.67 mL/min (ref >60.00)

## 2012-10-18 LAB — POCT URINALYSIS DIPSTICK
Glucose, UA: NEGATIVE
Nitrite, UA: NEGATIVE
Protein, UA: NEGATIVE
Urobilinogen, UA: 0.2

## 2012-10-18 LAB — HEPATIC FUNCTION PANEL
ALT: 19 U/L (ref 0–35)
Bilirubin, Direct: 0 mg/dL (ref 0.0–0.3)
Total Bilirubin: 0.5 mg/dL (ref 0.3–1.2)

## 2012-10-24 ENCOUNTER — Ambulatory Visit (INDEPENDENT_AMBULATORY_CARE_PROVIDER_SITE_OTHER): Payer: 59 | Admitting: Internal Medicine

## 2012-10-24 ENCOUNTER — Encounter: Payer: Self-pay | Admitting: Internal Medicine

## 2012-10-24 VITALS — BP 112/76 | HR 72 | Temp 98.0°F | Resp 16 | Ht 64.0 in | Wt 108.0 lb

## 2012-10-24 DIAGNOSIS — Z0000 Encounter for general adult medical examination without abnormal findings: Secondary | ICD-10-CM

## 2012-10-24 NOTE — Progress Notes (Signed)
Subjective:    Patient ID: Crystal Dillon, female    DOB: 11-06-1966, 46 y.o.   MRN: [id number]  HPI  Slight change in headache pattern with episodic cluster headaches.  She did have an episode of a daily headache for approximately a week.   Review of Systems  Constitutional: Negative for activity change, appetite change and fatigue.  HENT: Negative for ear pain, congestion, neck pain, postnasal drip and sinus pressure.   Eyes: Negative for redness and visual disturbance.  Respiratory: Negative for cough, shortness of breath and wheezing.   Gastrointestinal: Negative for abdominal pain and abdominal distention.  Genitourinary: Negative for dysuria, frequency and menstrual problem.  Musculoskeletal: Negative for myalgias, joint swelling and arthralgias.  Skin: Negative for rash and wound.  Neurological: Negative for dizziness, weakness and headaches.  Hematological: Negative for adenopathy. Does not bruise/bleed easily.  Psychiatric/Behavioral: Negative for sleep disturbance and decreased concentration.   Past Medical History  Diagnosis Date  . Headache   . Anemia   . Headache   . Depression     History   Social History  . Marital Status: Married    Spouse Name: N/A    Number of Children: N/A  . Years of Education: N/A   Occupational History  . [employer]   . Training and development officer    Social History Main Topics  . Smoking status: Never Smoker   . Smokeless tobacco: Not on file  . Alcohol Use: No  . Drug Use: No  . Sexually Active: Not on file   Other Topics Concern  . Not on file   Social History Narrative  . No narrative on file    Past Surgical History  Procedure Date  . Tonsillectomy   . Cesarean section   . Abdominal hysterectomy   . Tubal ligation   . Tonsillectomy     Family History  Problem Relation Age of Onset  . Colon cancer    . COPD Mother   . Hyperlipidemia Mother   . Diabetes Mother   . Cancer Father     lung  . Diabetes Father      Allergies  Allergen Reactions  . Prozac (Fluoxetine Hcl) Rash    Current Outpatient Prescriptions on File Prior to Visit  Medication Sig Dispense Refill  . eletriptan (RELPAX) 40 MG tablet One tablet by mouth at onset of headache. May repeat in 2 hours if headache persists or recurs. may repeat in 2 hours if necessary  11 tablet  5    BP 112/76  Pulse 72  Temp 98 F (36.7 C)  Resp 16  Ht 5\' 4"  (1.626 m)  Wt 108 lb (48.988 kg)  BMI 18.54 kg/m2       Objective:   Physical Exam  Nursing note and vitals reviewed. Constitutional: She is oriented to person, place, and time. She appears well-developed and well-nourished. No distress.  HENT:  Head: Normocephalic and atraumatic.  Right Ear: External ear normal.  Left Ear: External ear normal.  Nose: Nose normal.  Mouth/Throat: Oropharynx is clear and moist.  Eyes: Conjunctivae normal and EOM are normal. Pupils are equal, round, and reactive to light.  Neck: Normal range of motion. Neck supple. No JVD present. No tracheal deviation present. No thyromegaly present.  Cardiovascular: Normal rate, regular rhythm, normal heart sounds and intact distal pulses.   No murmur heard. Pulmonary/Chest: Effort normal and breath sounds normal. She has no wheezes. She exhibits no tenderness.  Abdominal: Soft. Bowel sounds are normal.  Musculoskeletal: Normal  range of motion. She exhibits no edema and no tenderness.  Lymphadenopathy:    She has no cervical adenopathy.  Neurological: She is alert and oriented to person, place, and time. She has normal reflexes. No cranial nerve deficit.  Skin: Skin is warm and dry. She is not diaphoretic.  Psychiatric: She has a normal mood and affect. Her behavior is normal.          Assessment & Plan:   This is a routine physical examination for this healthy  Female. Reviewed all health maintenance protocols including mammography colonoscopy bone density and reviewed appropriate screening labs. Her  immunization history was reviewed as well as her current medications and allergies refills of her chronic medications were given and the plan for yearly health maintenance was discussed all orders and referrals were made as appropriate.   Patient apparently is perimenopausal with increased headache frequency around menstrual cycles.  If these become unbearable in severity we would consider hormone manipulation.  She does have minimal risks for this in that she is not hypertensive has no cholesterol issues does not smoke nor never has smoked

## 2012-11-08 ENCOUNTER — Encounter: Payer: Self-pay | Admitting: Internal Medicine

## 2013-02-14 ENCOUNTER — Telehealth: Payer: Self-pay | Admitting: Internal Medicine

## 2013-02-14 NOTE — Telephone Encounter (Signed)
Caller: Crystal Dillon/Patient; Phone: [phone]; Reason for Call: She has appt coming up to see about being put on some medication.  She was thinking since she has been on Prozac before if she could just be put back on without an appt.  She had taken herself off d/t rash but now she thinks may have been her eczema I told her she would need to keep her appt because Prozac is actually listed now as a drug allergy and they won't call that in.

## 2013-02-19 ENCOUNTER — Encounter: Payer: Self-pay | Admitting: Family

## 2013-02-19 ENCOUNTER — Ambulatory Visit (INDEPENDENT_AMBULATORY_CARE_PROVIDER_SITE_OTHER): Payer: 59 | Admitting: Family

## 2013-02-19 VITALS — BP 110/80 | HR 88 | Wt 111.0 lb

## 2013-02-19 DIAGNOSIS — F411 Generalized anxiety disorder: Secondary | ICD-10-CM

## 2013-02-19 DIAGNOSIS — M1710 Unilateral primary osteoarthritis, unspecified knee: Secondary | ICD-10-CM

## 2013-02-19 DIAGNOSIS — IMO0002 Reserved for concepts with insufficient information to code with codable children: Secondary | ICD-10-CM

## 2013-02-19 MED ORDER — MELOXICAM 7.5 MG PO TABS: 7.5000 mg | ORAL_TABLET | Freq: Every day | ORAL | Status: DC

## 2013-02-19 MED ORDER — FLUOXETINE HCL 10 MG PO CAPS: 10.0000 mg | ORAL_CAPSULE | Freq: Every day | ORAL | Status: DC

## 2013-02-19 NOTE — Patient Instructions (Addendum)
Osteoarthritis Osteoarthritis is the most common form of arthritis. It is redness, soreness, and swelling (inflammation) affecting the cartilage. Cartilage acts as a cushion, covering the ends of bones where they meet to form a joint. CAUSES  Over time, the cartilage begins to wear away. This causes bone to rub on bone. This produces pain and stiffness in the affected joints. Factors that contribute to this problem are:  Excessive body weight.  Age.  Overuse of joints. SYMPTOMS   People with osteoarthritis usually experience joint pain, swelling, or stiffness.  Over time, the joint may lose its normal shape.  Small deposits of bone (osteophytes) may grow on the edges of the joint.  Bits of bone or cartilage can break off and float inside the joint space. This may cause more pain and damage.  Osteoarthritis can lead to depression, anxiety, feelings of helplessness, and limitations on daily activities. The most commonly affected joints are in the:  Ends of the fingers.  Thumbs.  Neck.  Lower back.  Knees.  Hips. DIAGNOSIS  Diagnosis is mostly based on your symptoms and exam. Tests may be helpful, including:  X-rays of the affected joint.  A computerized magnetic scan (MRI).  Blood tests to rule out other types of arthritis.  Joint fluid tests. This involves using a needle to draw fluid from the joint and examining the fluid under a microscope. TREATMENT  Goals of treatment are to control pain, improve joint function, maintain a normal body weight, and maintain a healthy lifestyle. Treatment approaches may include:  A prescribed exercise program with rest and joint relief.  Weight control with nutritional education.  Pain relief techniques such as:  Properly applied heat and cold.  Electric pulses delivered to nerve endings under the skin (transcutaneous electrical nerve stimulation, TENS).  Massage.  Certain supplements. Ask your caregiver before using any  supplements, especially in combination with prescribed drugs.  Medicines to control pain, such as:  Acetaminophen.  Nonsteroidal anti-inflammatory drugs (NSAIDs), such as naproxen.  Narcotic or central-acting agents, such as tramadol. This drug carries a risk of addiction and is generally prescribed for short-term use.  Corticosteroids. These can be given orally or as injection. This is a short-term treatment, not recommended for routine use.  Surgery to reposition the bones and relieve pain (osteotomy) or to remove loose pieces of bone and cartilage. Joint replacement may be needed in advanced states of osteoarthritis. HOME CARE INSTRUCTIONS  Your caregiver can recommend specific types of exercise. These may include:  Strengthening exercises. These are done to strengthen the muscles that support joints affected by arthritis. They can be performed with weights or with exercise bands to add resistance.  Aerobic activities. These are exercises, such as brisk walking or low-impact aerobics, that get your heart pumping. They can help keep your lungs and circulatory system in shape.  Range-of-motion activities. These keep your joints limber.  Balance and agility exercises. These help you maintain daily living skills. Learning about your condition and being actively involved in your care will help improve the course of your osteoarthritis. SEEK MEDICAL CARE IF:   You feel hot or your skin turns red.  You develop a rash in addition to your joint pain.  You have an oral temperature above 102 F (38.9 C). FOR MORE INFORMATION  National Institute of Arthritis and Musculoskeletal and Skin Diseases: www.niams.nih.gov National Institute on Aging: www.nia.nih.gov American College of Rheumatology: www.rheumatology.org Document Released: 12/05/2005 Document Revised: 02/27/2012 Document Reviewed: 03/18/2010 ExitCare Patient Information 2013 [employer].  

## 2013-02-19 NOTE — Progress Notes (Signed)
Subjective:    Patient ID: Crystal Dillon, female    DOB: [birth year], 47 y.o.   MRN: [id number]  HPI 47 year old white female, nonsmoker, patient of Dr. Lovell Sheehan is in today requesting to resume Prozac 10 mg once daily. She originally DC'd the medication because she felt it was causing a rash. She now notes that the rash was not caused by the medication. She would like to resume Prozac 10 mg once daily. Continues to have symptoms of anxiety and panic. Denies any feelings of helplessness, hopelessness, thoughts of death or dying.  Also has complaints of right shoulder pain, neck pain, and knee pain that tends to be worse first thing in the morning and better as the day progresses. She's been taken Advil but does help. Is concerned that the frequency is a common more often. She rates the pain a 8-9 out of 10.   Review of Systems  Constitutional: Negative.   HENT: Negative.   Respiratory: Negative.   Cardiovascular: Negative.   Gastrointestinal: Negative.   Musculoskeletal: Positive for arthralgias.  Skin: Negative.   Neurological: Negative.   Hematological: Negative.   Psychiatric/Behavioral: Negative for sleep disturbance and self-injury. The patient is nervous/anxious.    Past Medical History  Diagnosis Date  . Headache   . Anemia   . Headache   . Depression     History   Social History  . Marital Status: Married    Spouse Name: N/A    Number of Children: N/A  . Years of Education: N/A   Occupational History  . [employer]   . Training and development officer    Social History Main Topics  . Smoking status: Never Smoker   . Smokeless tobacco: Not on file  . Alcohol Use: No  . Drug Use: No  . Sexually Active: Not on file   Other Topics Concern  . Not on file   Social History Narrative  . No narrative on file    Past Surgical History  Procedure Laterality Date  . Tonsillectomy    . Cesarean section    . Abdominal hysterectomy    . Tubal ligation    . Tonsillectomy      Family  History  Problem Relation Age of Onset  . Colon cancer    . COPD Mother   . Hyperlipidemia Mother   . Diabetes Mother   . Cancer Father     lung  . Diabetes Father     No Active Allergies  Current Outpatient Prescriptions on File Prior to Visit  Medication Sig Dispense Refill  . eletriptan (RELPAX) 40 MG tablet One tablet by mouth at onset of headache. May repeat in 2 hours if headache persists or recurs. may repeat in 2 hours if necessary  11 tablet  5   No current facility-administered medications on file prior to visit.    BP 110/80  Pulse 88  Wt 111 lb (50.349 kg)  BMI 19.04 kg/m2  SpO2 98%chart    Objective:   Physical Exam  Constitutional: She is oriented to person, place, and time. She appears well-developed and well-nourished.  Neck: Normal range of motion.  Cardiovascular: Normal rate, regular rhythm and normal heart sounds.   Pulmonary/Chest: Effort normal and breath sounds normal.  Abdominal: Soft. Bowel sounds are normal.  Musculoskeletal: Normal range of motion. She exhibits no edema and no tenderness.  Neurological: She is alert and oriented to person, place, and time.  Skin: Skin is warm and dry.  Psychiatric: She has a normal mood  and affect.          Assessment & Plan:  Assessment:  1. Anxiety 2. Osteoarthritis  Plan: Resume Prozac 10 mg once daily. For arthritis, Mobic 7-1/2 mg once daily with food. Recheck patient in 3 weeks and sooner as needed.

## 2013-02-21 ENCOUNTER — Telehealth: Payer: Self-pay | Admitting: Internal Medicine

## 2013-02-21 NOTE — Telephone Encounter (Signed)
Prior auth in progress with [facility]. Encounter closed.

## 2013-02-21 NOTE — Telephone Encounter (Signed)
Pt has [employer] id : [id number]  Phone no. on card [phone]  or     [phone]   Pharm claim no. [phone] Pharm: [facility]/ [address]/ [address], [place] [facility] bin no. [id number] [facility]   [facility] - Need 8 for 30 days    eletriptan (RELPAX) 40 MG tablet -Pt only has one pill left.

## 2013-02-26 ENCOUNTER — Telehealth: Payer: Self-pay | Admitting: Internal Medicine

## 2013-02-26 MED ORDER — ELETRIPTAN HYDROBROMIDE 40 MG PO TABS: 40.0000 mg | ORAL_TABLET | ORAL | Status: DC | PRN

## 2013-02-26 NOTE — Telephone Encounter (Signed)
Pt informed and he states he doesn't mind paying for the extra

## 2013-02-26 NOTE — Telephone Encounter (Signed)
Patient's prior auth on Relpax denied. The med does not requires prior auth - she can get the med. However, insurance only covers 4 tabs/month - they will not cover more, regardless of any authorization. If pt requires more (she stated she needs 8/month) - she will have to pay cash, per insurance. Thanks.

## 2013-04-26 ENCOUNTER — Encounter: Payer: Self-pay | Admitting: Internal Medicine

## 2013-04-26 ENCOUNTER — Ambulatory Visit (INDEPENDENT_AMBULATORY_CARE_PROVIDER_SITE_OTHER): Payer: 59 | Admitting: Internal Medicine

## 2013-04-26 VITALS — BP 110/78 | HR 72 | Temp 98.2°F | Resp 16 | Ht <= 58 in | Wt 110.0 lb

## 2013-04-26 DIAGNOSIS — R21 Rash and other nonspecific skin eruption: Secondary | ICD-10-CM

## 2013-04-26 DIAGNOSIS — I83219 Varicose veins of right lower extremity with both ulcer of unspecified site and inflammation: Secondary | ICD-10-CM

## 2013-04-26 DIAGNOSIS — F411 Generalized anxiety disorder: Secondary | ICD-10-CM

## 2013-04-26 DIAGNOSIS — I7300 Raynaud's syndrome without gangrene: Secondary | ICD-10-CM

## 2013-04-26 DIAGNOSIS — L97929 Non-pressure chronic ulcer of unspecified part of left lower leg with unspecified severity: Secondary | ICD-10-CM

## 2013-04-26 DIAGNOSIS — I83223 Varicose veins of left lower extremity with both ulcer of ankle and inflammation: Secondary | ICD-10-CM

## 2013-04-26 DIAGNOSIS — R51 Headache: Secondary | ICD-10-CM

## 2013-04-26 LAB — SEDIMENTATION RATE: Sed Rate: 19 mm/hr (ref 0–22)

## 2013-04-26 LAB — BASIC METABOLIC PANEL
BUN: 11 mg/dL (ref 6–23)
CO2: 28 mEq/L (ref 19–32)
Chloride: 102 mEq/L (ref 96–112)
Creatinine, Ser: 0.7 mg/dL (ref 0.4–1.2)
Potassium: 3.8 mEq/L (ref 3.5–5.1)

## 2013-04-26 LAB — TSH: TSH: 0.99 u[IU]/mL (ref 0.35–5.50)

## 2013-04-26 MED ORDER — FLUOXETINE HCL 20 MG PO CAPS: 20.0000 mg | ORAL_CAPSULE | Freq: Every day | ORAL | Status: DC

## 2013-04-26 MED ORDER — VALACYCLOVIR HCL 1 G PO TABS: 1000.0000 mg | ORAL_TABLET | Freq: Two times a day (BID) | ORAL | Status: DC

## 2013-04-26 NOTE — Progress Notes (Signed)
  Subjective:    Patient ID: Crystal Dillon, female    DOB: [birth year], 47 y.o.   MRN: [id number]  Headache  Pertinent negatives include no abdominal pain, coughing, dizziness, ear pain, eye redness, neck pain, sinus pressure or weakness.   Old female who presents for followup of migraine headaches and some increased stress.  She started Prozac 10 mg by mouth day and has noticed some difference in her overall anxiety level intention.  She still has the cluster migraines on a weekly basis.  No other associated symptoms such as unplanned weight loss, fever chills or symptoms of infection Primary change in her migraine pattern has been intensity and frequency   Review of Systems  Constitutional: Negative for activity change, appetite change and fatigue.  HENT: Negative for ear pain, congestion, neck pain, postnasal drip and sinus pressure.   Eyes: Negative for redness and visual disturbance.  Respiratory: Negative for cough, shortness of breath and wheezing.   Gastrointestinal: Negative for abdominal pain and abdominal distention.  Genitourinary: Negative for dysuria, frequency and menstrual problem.  Musculoskeletal: Negative for myalgias, joint swelling and arthralgias.  Skin: Negative for rash and wound.  Neurological: Positive for headaches. Negative for dizziness and weakness.  Hematological: Negative for adenopathy. Does not bruise/bleed easily.  Psychiatric/Behavioral: Negative for sleep disturbance and decreased concentration.  All other systems reviewed and are negative.       Objective:   Physical Exam  Nursing note and vitals reviewed. Constitutional: She is oriented to person, place, and time. She appears well-developed and well-nourished. No distress.  HENT:  Head: Normocephalic and atraumatic.  Right Ear: External ear normal.  Left Ear: External ear normal.  Nose: Nose normal.  Mouth/Throat: Oropharynx is clear and moist.  Eyes: Conjunctivae and EOM are normal.  Pupils are equal, round, and reactive to light.  Neck: Normal range of motion. Neck supple. No JVD present. No tracheal deviation present. No thyromegaly present.  Cardiovascular: Normal rate, regular rhythm, normal heart sounds and intact distal pulses.   No murmur heard. Pulmonary/Chest: Effort normal and breath sounds normal. She has no wheezes. She exhibits no tenderness.  Abdominal: Soft. Bowel sounds are normal.  Musculoskeletal: Normal range of motion. She exhibits no edema and no tenderness.  Lymphadenopathy:    She has no cervical adenopathy.  Neurological: She is alert and oriented to person, place, and time. She has normal reflexes. No cranial nerve deficit.  Skin: Skin is warm and dry. She is not diaphoretic.  Psychiatric: She has a normal mood and affect. Her behavior is normal.          Assessment & Plan:  We'll increase the Prozac to 20 mg by mouth daily and monitor her for the next 3-4 weeks if the headache pattern does not change the next 2 steps would be one an MRI to to add a different medication for headache prophylaxis if the MRI is normal

## 2013-04-26 NOTE — Addendum Note (Signed)
Addended by: Stacie Glaze on: 04/26/2013 03:46 PM   Modules accepted: Orders

## 2013-04-26 NOTE — Assessment & Plan Note (Signed)
Migraine headaches have taken a new pattern she gets them and cluster patterns now that may last several days.  The pattern weekly  Occasionally has a prodrome Respond to the Relpax but the frequency of need exceeds the availability of the prescription

## 2013-04-26 NOTE — Patient Instructions (Signed)
Please call overuse my chart to contact me in 2-3 weeks to tell me if the increased dose of the Prozac has affected the intensity and frequency of her migraines if there has been no change and we will order an MRI

## 2013-05-08 ENCOUNTER — Telehealth: Payer: Self-pay | Admitting: Internal Medicine

## 2013-05-08 NOTE — Telephone Encounter (Signed)
Pt informed - cbg slightly low

## 2013-05-08 NOTE — Telephone Encounter (Signed)
PT calling to inquire about her lab results from 04/26/13. Please assist.

## 2013-12-06 LAB — HM PAP SMEAR: HM PAP: NORMAL

## 2014-01-10 ENCOUNTER — Encounter: Payer: Self-pay | Admitting: Internal Medicine

## 2014-03-05 ENCOUNTER — Other Ambulatory Visit: Payer: Self-pay | Admitting: Internal Medicine

## 2014-03-21 ENCOUNTER — Other Ambulatory Visit: Payer: Self-pay | Admitting: Internal Medicine

## 2014-04-22 ENCOUNTER — Other Ambulatory Visit: Payer: Self-pay | Admitting: Internal Medicine

## 2014-04-22 ENCOUNTER — Telehealth: Payer: Self-pay | Admitting: Internal Medicine

## 2014-04-22 NOTE — Telephone Encounter (Signed)
yes

## 2014-04-22 NOTE — Telephone Encounter (Signed)
Pt is a long time patient of dr Arnoldo Morale. Has seen you before and would like to know if you will accept her as a patient?

## 2014-04-22 NOTE — Telephone Encounter (Signed)
Pt aware and will call and schedule own appt. Thanks!

## 2014-05-17 ENCOUNTER — Other Ambulatory Visit: Payer: Self-pay | Admitting: Internal Medicine

## 2014-06-02 ENCOUNTER — Ambulatory Visit: Payer: 59 | Admitting: Family Medicine

## 2014-06-09 ENCOUNTER — Telehealth: Payer: Self-pay

## 2014-06-09 NOTE — Telephone Encounter (Signed)
New patient Previous PCP:  Dr. Mignon Pine   Medication and allergies:  Reviewed and updated  90 day supply/mail order: n/a Local pharmacy:  [id number] - [place], [place] - [address]   Immunizations due: UTD   A/P: No changes to personal, family history or past surgical hx PAP- 11/2013-normal--per pt--[facility]- has never had one before- DUE [facility]- 12/06/13-negative--[facility]--marked as DUE Tdap- 09/20/05   To Discuss with Kosisochukwu Burningham: Nasal congestion and nonproductive cough x 2 months.  Pt was prescribed antibiotics for 2 weeks--Amoxicillin 875 mg PO BID, Flonase, and Tessalon 100 capsules PO TID and symptoms are not better.

## 2014-06-10 ENCOUNTER — Ambulatory Visit (HOSPITAL_BASED_OUTPATIENT_CLINIC_OR_DEPARTMENT_OTHER)
Admission: RE | Admit: 2014-06-10 | Discharge: 2014-06-10 | Disposition: A | Discharge status: Home / Self Care | Payer: 59 | Source: Ambulatory Visit | Attending: Family Medicine | Admitting: Family Medicine

## 2014-06-10 ENCOUNTER — Ambulatory Visit (INDEPENDENT_AMBULATORY_CARE_PROVIDER_SITE_OTHER): Payer: 59 | Admitting: Family Medicine

## 2014-06-10 ENCOUNTER — Encounter: Payer: Self-pay | Admitting: Family Medicine

## 2014-06-10 VITALS — BP 110/70 | HR 71 | Temp 98.2°F | Ht 62.0 in | Wt 112.4 lb

## 2014-06-10 DIAGNOSIS — F411 Generalized anxiety disorder: Secondary | ICD-10-CM

## 2014-06-10 DIAGNOSIS — R059 Cough, unspecified: Secondary | ICD-10-CM

## 2014-06-10 DIAGNOSIS — R05 Cough: Secondary | ICD-10-CM

## 2014-06-10 MED ORDER — FLUOXETINE HCL 20 MG PO CAPS: ORAL_CAPSULE | ORAL | Status: DC

## 2014-06-10 MED ORDER — PREDNISONE 10 MG PO TABS: ORAL_TABLET | ORAL | Status: DC

## 2014-06-10 MED ORDER — ELETRIPTAN HYDROBROMIDE 40 MG PO TABS: ORAL_TABLET | ORAL | Status: DC

## 2014-06-10 MED ORDER — GUAIFENESIN-CODEINE 100-10 MG/5ML PO SYRP: ORAL_SOLUTION | ORAL | Status: DC

## 2014-06-10 NOTE — Progress Notes (Signed)
Pre visit review using our clinic review tool, if applicable. No additional management support is needed unless otherwise documented below in the visit note. 

## 2014-06-10 NOTE — Patient Instructions (Signed)
Cough, Adult  A cough is a reflex that helps clear your throat and airways. It can help heal the body or may be a reaction to an irritated airway. A cough may only last 2 or 3 weeks (acute) or may last more than 8 weeks (chronic).  CAUSES Acute cough:  Viral or bacterial infections. Chronic cough:  Infections.  Allergies.  Asthma.  Post-nasal drip.  Smoking.  Heartburn or acid reflux.  Some medicines.  Chronic lung problems (COPD).  Cancer. SYMPTOMS   Cough.  Fever.  Chest pain.  Increased breathing rate.  High-pitched whistling sound when breathing (wheezing).  Colored mucus that you cough up (sputum). TREATMENT   A bacterial cough may be treated with antibiotic medicine.  A viral cough must run its course and will not respond to antibiotics.  Your caregiver may recommend other treatments if you have a chronic cough. HOME CARE INSTRUCTIONS   Only take over-the-counter or prescription medicines for pain, discomfort, or fever as directed by your caregiver. Use cough suppressants only as directed by your caregiver.  Use a cold steam vaporizer or humidifier in your bedroom or home to help loosen secretions.  Sleep in a semi-upright position if your cough is worse at night.  Rest as needed.  Stop smoking if you smoke. SEEK IMMEDIATE MEDICAL CARE IF:   You have pus in your sputum.  Your cough starts to worsen.  You cannot control your cough with suppressants and are losing sleep.  You begin coughing up blood.  You have difficulty breathing.  You develop pain which is getting worse or is uncontrolled with medicine.  You have a fever. MAKE SURE YOU:   Understand these instructions.  Will watch your condition.  Will get help right away if you are not doing well or get worse. Document Released: 06/03/2011 Document Revised: 02/27/2012 Document Reviewed: 06/03/2011 ExitCare Patient Information 2015 [employer]. This information is not intended  to replace advice given to you by your health care provider. Make sure you discuss any questions you have with your health care provider.  

## 2014-06-10 NOTE — Progress Notes (Signed)
  Subjective:     Crystal Dillon is a 48 y.o. female here for evaluation of a cough. Onset of symptoms was 8 weeks ago. Symptoms have been unchanged since that time. The cough is dry and raspy and is aggravated by  laying down and in humid weather. Associated symptoms include: chills and wheezing. Patient does not have a history of asthma. Patient does have a history of environmental allergens. Patient has not traveled recently. Patient does not have a history of smoking. Patient has not had a previous chest x-ray. Patient has not had a PPD done.  Pt also c/o pain between shoulder blades from coughing and working .  Pt is a cpa.  The following portions of the patient's history were reviewed and updated as appropriate: allergies, current medications, past family history, past medical history, past social history, past surgical history and problem list.  Review of Systems Pertinent items are noted in HPI.    Objective:    Oxygen saturation 97% on room air BP 110/70  Pulse 71  Temp(Src) 98.2 F (36.8 C) (Oral)  Ht 5\' 2"  (1.575 m)  Wt 112 lb 6.4 oz (50.984 kg)  BMI 20.55 kg/m2  SpO2 97% General appearance: alert, cooperative, appears stated age and no distress Ears: normal TM's and external ear canals both ears Nose: Nares normal. Septum midline. Mucosa normal. No drainage or sinus tenderness. Throat: lips, mucosa, and tongue normal; teeth and gums normal Neck: mild anterior cervical adenopathy, supple, symmetrical, trachea midline and thyroid not enlarged, symmetric, no tenderness/mass/nodules Lungs: clear to auscultation bilaterally Heart: S1, S2 normal   + muscle spasm T4-7 L > R-- HLVA used with success.  Pt was feeling much better after adjustment. Assessment:    Acute Bronchitis    Plan:    Antibiotics per medication orders. Antitussives per medication orders. Avoid exposure to tobacco smoke and fumes. Call if shortness of breath worsens, blood in sputum, change in character  of cough, development of fever or chills, inability to maintain nutrition and hydration. Avoid exposure to tobacco smoke and fumes. Chest x-ray. Trial of antihistamines. Trial of steroid nasal spray.   1. Cough  - DG Chest 2 View; Future - guaiFENesin-codeine (ROBITUSSIN AC) 100-10 MG/5ML syrup; 1-2 tsp po qhs prn cough  Dispense: 120 mL; Refill: 0 - predniSONE (DELTASONE) 10 MG tablet; 3 po qd for 3 days then 2 po qd for 3 days the 1 po qd for 3 days  Dispense: 18 tablet; Refill: 0  2. Generalized anxiety disorder   - FLUoxetine (PROZAC) 20 MG capsule; TAKE ONE CAPSULE BY MOUTH ONCE DAILY  Dispense: 180 capsule; Refill: 3 3. Migraines-- refill relpax

## 2014-06-12 ENCOUNTER — Other Ambulatory Visit: Payer: Self-pay

## 2014-06-12 DIAGNOSIS — F411 Generalized anxiety disorder: Secondary | ICD-10-CM

## 2014-06-12 MED ORDER — FLUOXETINE HCL 20 MG PO CAPS: ORAL_CAPSULE | ORAL | Status: DC

## 2014-06-21 ENCOUNTER — Emergency Department (HOSPITAL_BASED_OUTPATIENT_CLINIC_OR_DEPARTMENT_OTHER)
Admission: EM | Admit: 2014-06-21 | Discharge: 2014-06-21 | Disposition: A | Discharge status: Home / Self Care | Payer: 59 | Attending: Emergency Medicine | Admitting: Emergency Medicine

## 2014-06-21 ENCOUNTER — Emergency Department (HOSPITAL_BASED_OUTPATIENT_CLINIC_OR_DEPARTMENT_OTHER): Payer: 59

## 2014-06-21 ENCOUNTER — Encounter (HOSPITAL_BASED_OUTPATIENT_CLINIC_OR_DEPARTMENT_OTHER): Payer: Self-pay | Admitting: Emergency Medicine

## 2014-06-21 DIAGNOSIS — Z79899 Other long term (current) drug therapy: Secondary | ICD-10-CM | POA: Insufficient documentation

## 2014-06-21 DIAGNOSIS — J3489 Other specified disorders of nose and nasal sinuses: Secondary | ICD-10-CM | POA: Insufficient documentation

## 2014-06-21 DIAGNOSIS — R0789 Other chest pain: Secondary | ICD-10-CM

## 2014-06-21 DIAGNOSIS — R0602 Shortness of breath: Secondary | ICD-10-CM | POA: Insufficient documentation

## 2014-06-21 DIAGNOSIS — R05 Cough: Secondary | ICD-10-CM | POA: Insufficient documentation

## 2014-06-21 DIAGNOSIS — Z8659 Personal history of other mental and behavioral disorders: Secondary | ICD-10-CM | POA: Insufficient documentation

## 2014-06-21 DIAGNOSIS — R059 Cough, unspecified: Secondary | ICD-10-CM

## 2014-06-21 DIAGNOSIS — Z862 Personal history of diseases of the blood and blood-forming organs and certain disorders involving the immune mechanism: Secondary | ICD-10-CM | POA: Insufficient documentation

## 2014-06-21 DIAGNOSIS — G43909 Migraine, unspecified, not intractable, without status migrainosus: Secondary | ICD-10-CM | POA: Insufficient documentation

## 2014-06-21 DIAGNOSIS — R072 Precordial pain: Secondary | ICD-10-CM | POA: Insufficient documentation

## 2014-06-21 DIAGNOSIS — R51 Headache: Secondary | ICD-10-CM | POA: Insufficient documentation

## 2014-06-21 MED ORDER — GUAIFENESIN-CODEINE 100-10 MG/5ML PO SYRP: ORAL_SOLUTION | ORAL | Status: DC

## 2014-06-21 NOTE — Discharge Instructions (Signed)

## 2014-06-21 NOTE — ED Provider Notes (Signed)
CSN: [id number]     Arrival date & time 06/21/14  1415 History  This chart was scribed for Crystal Hamburger, MD by Jeanell Sparrow, ED Scribe. This patient was seen in room MH01/MH01 and the patient's care was started at 4:10 PM.  Chief Complaint  Patient presents with  . Cough    The history is provided by the patient and the spouse. No language interpreter was used.   HPI Comments: Crystal Dillon is a 48 y.o. female with a hx of headaches who presents to the Emergency Department complaining of a cough that started three months ago. She states that her cough has worsened in the last few days along with pain that radiates from her chest down to her upper extremities and fingers while she coughs. She describes the pain as an intense, achy, and sharp. She reports that her cough is dry and she has SOB while coughing. She states that her coughing has been keeping her up at night. She reports rhinorrhea and headache. She states that she has been taking Tylenol and IBU for the pain with some relief. She also states that she has been taking prednisone and codeine. She denies any history of smoking. She denies any fever, sore throat, stuffy nose, leg swelling, emesis, diaphoresis, or chest pain while walking. No weight loss.  Past Medical History  Diagnosis Date  . Headache(784.0)   . Anemia   . Depression   . Migraine    Past Surgical History  Procedure Laterality Date  . Tonsillectomy    . Cesarean section    . Tubal ligation    . Tonsillectomy    . Hysteroscopy     Family History  Problem Relation Age of Onset  . Colon cancer    . COPD Mother   . Hyperlipidemia Mother   . Diabetes Mother   . Cancer Father     lung  . Diabetes Father    History  Substance Use Topics  . Smoking status: Never Smoker   . Smokeless tobacco: Not on file  . Alcohol Use: No   OB History   Grav Para Term Preterm Abortions TAB SAB Ect Mult Living                 Review of Systems  Constitutional:  Negative for fever and diaphoresis.  HENT: Positive for rhinorrhea. Negative for congestion and sore throat.   Respiratory: Positive for cough and shortness of breath (when coughing).   Cardiovascular: Positive for chest pain (during coughing). Negative for leg swelling.  Gastrointestinal: Negative for vomiting.  Neurological: Positive for headaches.  All other systems reviewed and are negative.     Allergies  Review of patient's allergies indicates no known allergies.  Home Medications   Prior to Admission medications   Medication Sig Start Date End Date Taking? Authorizing Provider  benzonatate (TESSALON) 100 MG capsule Take 100 mg by mouth 3 (three) times daily as needed for cough.   Yes Historical Provider, MD  eletriptan (RELPAX) 40 MG tablet TAKE 1 TABLET BY MOUTH AT ONSET OF HEADACHE MAY REPEAT IN 2 HOURS IF PERSISTS OR REOCCURS 06/10/14   Rosalita Chessman, DO  FLUoxetine (PROZAC) 20 MG capsule TAKE ONE CAPSULE BY MOUTH ONCE DAILY 06/12/14   Rosalita Chessman, DO  guaiFENesin-codeine [facility]) 100-10 MG/5ML syrup 1-2 tsp po qhs prn cough 06/10/14   Alferd Apa Lowne, DO  predniSONE (DELTASONE) 10 MG tablet 3 po qd for 3 days then 2 po qd  for 3 days the 1 po qd for 3 days 06/10/14   Rosalita Chessman, DO  promethazine (PHENERGAN) 25 MG tablet Take 25 mg by mouth every 6 (six) hours as needed for nausea or vomiting.    Historical Provider, MD   BP 131/75  Pulse 64  Temp(Src) 98.4 F (36.9 C) (Oral)  Resp 18  Ht 5\' 1"  (1.549 m)  Wt 105 lb (47.628 kg)  BMI 19.85 kg/m2  SpO2 100%  LMP 07/06/2011 Physical Exam  Nursing note and vitals reviewed. Constitutional: She is oriented to person, place, and time. She appears well-developed and well-nourished. No distress.  HENT:  Head: Normocephalic and atraumatic.  Right Ear: External ear normal.  Left Ear: External ear normal.  Nose: Nose normal.  Eyes: Right eye exhibits no discharge. Left eye exhibits no discharge.  Neck: Neck supple.  No tracheal deviation present.  Cardiovascular: Normal rate, regular rhythm and normal heart sounds.   Pulmonary/Chest: Effort normal and breath sounds normal. No respiratory distress. She has no wheezes. She has no rales.  Abdominal: She exhibits no distension.  Neurological: She is alert and oriented to person, place, and time.  Skin: Skin is warm and dry.  Psychiatric: She has a normal mood and affect. Her behavior is normal.    ED Course  Procedures (including critical care time) DIAGNOSTIC STUDIES: Oxygen Saturation is 100% on RA, normal by my interpretation.    COORDINATION OF CARE: 4:14 PM- Pt advised of plan for treatment which includes radiology and pt agrees.  Labs Review Labs Reviewed - No data to display  Imaging Review Dg Chest 2 View  06/21/2014   CLINICAL DATA:  Cough.  EXAM: CHEST  2 VIEW  COMPARISON:  06/10/2014.  FINDINGS: Knee cardiac silhouette, mediastinal and hilar contours are normal. The lungs demonstrate mild hyperinflation but no infiltrates, edema or effusions. The bony thorax is intact.  IMPRESSION: No acute cardiopulmonary findings.   Electronically Signed   By: Kalman Jewels M.D.   On: 06/21/2014 15:11     EKG Interpretation   Date/Time:  Saturday June 21 2014 16:25:50 EDT Ventricular Rate:  60 PR Interval:  194 QRS Duration: 72 QT Interval:  424 QTC Calculation: 424 R Axis:   65 Text Interpretation:  Normal sinus rhythm with sinus arrhythmia Normal ECG  No old tracing to compare Confirmed by Jerret Mcbane  MD, Terryn Redner ([id number]) on  06/21/2014 4:30:17 PM      MDM   Final diagnoses:  Cough  Chest wall pain    Lung exam is normal, no hypoxia, distress or focal lung abnormalities. No PNA on CXR. Not c/w PE. Chest pain appears musculoskeletal as it only happens after coughing. Benign EKG. She is almost out of her guaifenesin-codeine, will refill and recommend increasing dose slightly. She has been on multiple other meds and home remedies before this.  Will refer back to PCP, may need pulmonology f/u as outpatient.   I personally performed the services described in this documentation, which was scribed in my presence. The recorded information has been reviewed and is accurate.    Crystal Hamburger, MD 06/21/14 2053

## 2014-06-21 NOTE — ED Notes (Signed)
Patient states she has had a chronic cough for several months, but the last few days the cough has worsened. Now when she coughs she has pain across her chest and down both arms.

## 2014-06-22 ENCOUNTER — Encounter: Payer: Self-pay | Admitting: Family Medicine

## 2014-06-24 ENCOUNTER — Telehealth: Payer: Self-pay | Admitting: *Deleted

## 2014-06-24 NOTE — Telephone Encounter (Signed)
[facility] Triage Call Report Triage Record Num: [id number] Operator: Janit Pagan Patient Name: Crystal Dillon Call Date & Time: 06/21/2014 1:25:08PM Patient Phone: [phone] PCP: Patient Gender: Female PCP Fax : Patient DOB: 01-23-1966 [facility] Name[facility] Reason for Call: Caller: [name]/Patient; PCP: Other; CB#: [phone]; Call regarding Cough/Congestion; Has had this cough for 3 months, seen in office twice - has been on antibiotic, Flonase, Prednisone, Tessalon pills, cough Rx with codeine. Cough worse since Thursday 7/2, coughing spasms, coughing unitl can't catch breath. Coughing results in chest pain that radiates down both arms but only when coughing. Now back and chest sore. Triaged in Cough Adult Guideline - See ED Immediately due to continuoius cough causing difficulty breathing. Protocol(s) Used: Cough - Adult Recommended Outcome per Protocol: See ED Immediately Reason for Outcome: Continuous cough causing difficulty breathing Care Advice: ~ Another adult should drive. ~ Do not give the patient anything to eat or drink. Call EMS 911 if loss of consciousness, struggling to breathe, experiences new confusion or extreme drowsiness, change in skin color, or has chest pain or discomfort lasting 5 minutes or more. ~ ~ Place person in a position of comfort and loosen tight clothing. 07/

## 2014-10-03 ENCOUNTER — Other Ambulatory Visit: Payer: Self-pay | Admitting: Family Medicine

## 2015-06-14 ENCOUNTER — Other Ambulatory Visit: Payer: Self-pay | Admitting: Family Medicine

## 2015-06-25 ENCOUNTER — Ambulatory Visit: Payer: Self-pay | Admitting: Family Medicine

## 2015-06-26 ENCOUNTER — Telehealth: Payer: Self-pay | Admitting: Family Medicine

## 2015-06-26 MED ORDER — FLUOXETINE HCL 20 MG PO CAPS
20.0000 mg | ORAL_CAPSULE | Freq: Every day | ORAL | Status: DC
Start: 2015-06-26 — End: 2016-09-20

## 2015-06-26 NOTE — Telephone Encounter (Signed)
Caller name: Aziya Arena  Relation to pt: self Call back number: [phone] Pharmacy: [id number] - [place], [place]  Reason for call:  Pt requesting a refill FLUoxetine (PROZAC) 20 MG capsule. Pt scheduled physical for 07/27/2015

## 2015-06-26 NOTE — Telephone Encounter (Signed)
Rx faxed.    KP 

## 2015-07-07 ENCOUNTER — Telehealth: Payer: Self-pay | Admitting: Family Medicine

## 2015-07-07 NOTE — Telephone Encounter (Signed)
pre visit letter mailed 07/06/15

## 2015-07-21 ENCOUNTER — Ambulatory Visit: Payer: Self-pay | Admitting: Family Medicine

## 2015-07-24 ENCOUNTER — Telehealth: Payer: Self-pay | Admitting: *Deleted

## 2015-07-24 ENCOUNTER — Encounter: Payer: Self-pay | Admitting: *Deleted

## 2015-07-24 NOTE — Telephone Encounter (Signed)
Pre-Visit Call completed with patient and chart updated.   Pre-Visit Info documented in Specialty Comments under [employer].    

## 2015-07-27 ENCOUNTER — Encounter: Payer: Self-pay | Admitting: Family Medicine

## 2016-09-20 ENCOUNTER — Encounter: Payer: Self-pay | Admitting: Family Medicine

## 2016-09-20 ENCOUNTER — Ambulatory Visit (INDEPENDENT_AMBULATORY_CARE_PROVIDER_SITE_OTHER): Payer: Commercial Managed Care - HMO | Admitting: Family Medicine

## 2016-09-20 VITALS — BP 100/72 | HR 64 | Temp 98.2°F | Ht 61.0 in | Wt 119.8 lb

## 2016-09-20 DIAGNOSIS — G8929 Other chronic pain: Secondary | ICD-10-CM

## 2016-09-20 DIAGNOSIS — M25561 Pain in right knee: Secondary | ICD-10-CM

## 2016-09-20 DIAGNOSIS — D259 Leiomyoma of uterus, unspecified: Secondary | ICD-10-CM

## 2016-09-20 DIAGNOSIS — Z7689 Persons encountering health services in other specified circumstances: Secondary | ICD-10-CM

## 2016-09-20 DIAGNOSIS — R739 Hyperglycemia, unspecified: Secondary | ICD-10-CM

## 2016-09-20 DIAGNOSIS — F411 Generalized anxiety disorder: Secondary | ICD-10-CM | POA: Insufficient documentation

## 2016-09-20 DIAGNOSIS — Z23 Encounter for immunization: Secondary | ICD-10-CM | POA: Diagnosis not present

## 2016-09-20 DIAGNOSIS — F419 Anxiety disorder, unspecified: Secondary | ICD-10-CM

## 2016-09-20 DIAGNOSIS — M51369 Other intervertebral disc degeneration, lumbar region without mention of lumbar back pain or lower extremity pain: Secondary | ICD-10-CM | POA: Insufficient documentation

## 2016-09-20 DIAGNOSIS — M25552 Pain in left hip: Secondary | ICD-10-CM

## 2016-09-20 DIAGNOSIS — M5136 Other intervertebral disc degeneration, lumbar region: Secondary | ICD-10-CM

## 2016-09-20 DIAGNOSIS — M25562 Pain in left knee: Secondary | ICD-10-CM

## 2016-09-20 DIAGNOSIS — G43909 Migraine, unspecified, not intractable, without status migrainosus: Secondary | ICD-10-CM | POA: Insufficient documentation

## 2016-09-20 DIAGNOSIS — D219 Benign neoplasm of connective and other soft tissue, unspecified: Secondary | ICD-10-CM | POA: Insufficient documentation

## 2016-09-20 DIAGNOSIS — G43809 Other migraine, not intractable, without status migrainosus: Secondary | ICD-10-CM | POA: Diagnosis not present

## 2016-09-20 MED ORDER — FLUOXETINE HCL 20 MG PO CAPS: 20.0000 mg | ORAL_CAPSULE | Freq: Every day | ORAL | 3 refills | Status: DC

## 2016-09-20 MED ORDER — ELETRIPTAN HYDROBROMIDE 40 MG PO TABS: ORAL_TABLET | ORAL | 5 refills | Status: DC

## 2016-09-20 NOTE — Progress Notes (Signed)
Pre visit review using our clinic review tool, if applicable. No additional management support is needed unless otherwise documented below in the visit note. 

## 2016-09-20 NOTE — Patient Instructions (Signed)
BEFORE YOU LEAVE: -follow up: physical in 3 months, come fasting  Call to set up evaluation with a gynecologist.  We recommend the following healthy lifestyle for LIFE: 1) Small portions.   Tip: eat off of a salad plate instead of a dinner plate.  Tip: It is ok to feel hungry after a meal - that likely means you ate an appropriate portion.  Tip: if you need more or a snack choose fruits, veggies and/or a handful of nuts or seeds.  2) Eat a healthy clean diet.  * Tip: Avoid (less then 1 serving per week): processed foods, sweets, sweetened drinks, white starches (rice, flour, bread, potatoes, pasta, etc), red meat, fast foods, butter  *Tip: CHOOSE instead   * 5-9 servings per day of fresh or frozen fruits and vegetables (but not corn, potatoes, bananas, canned or dried fruit)   *nuts and seeds, beans   *olives and olive oil   *small portions of lean meats such as fish and white chicken    *small portions of whole grains  3)Get at least 150 minutes of sweaty aerobic exercise per week.  4)Reduce stress - consider counseling, meditation and relaxation to balance other aspects of your life.

## 2016-09-20 NOTE — Progress Notes (Signed)
HPI:  Crystal Dillon is a pleasant 50 yo F here to establish care. She showed up 20 minutes late for this visit, unfortunately because of a road closure. Used to see Dr. Etter Sjogren and then had PCP in [place], now back in [place]. Saw PCP in [place] for physical about 1 year ago.   Has the following chronic problems that require follow up and concerns today:  Migraines: -her whole life -relpax prn and ibuprfen -has tried imitrex, maxalt, topamax and amitryptyline and these do not work or have side effects -unchanged -requests refill on relpax  Reporte hx OA/DDD: -back, knees, L hip -occ flares of pain in these areas -not getting any exercise recently  Hx anxiety: -prozac 20mg  daily -stable for a long time, also helps the headaches, would like to continue this medication - requests refill -no hx depression or SI  Fibroids/pain: -postmenopausal -see Dr. Lanny Cramp in [place] and placed on camila for the fibroid related pain per her report -she wants to see a gynecologist in [place] -she had declined hysterectomy, wants to get off OCPs -anemia prior to menopause  Hx borderline hyperglycemia: -borderline prediabetes -eating healthier and trying to exercise - no regular exercise now  ROS negative for unless reported above: fevers, unintentional weight loss, hearing or vision loss, chest pain, palpitations, struggling to breath, hemoptysis, melena, hematochezia, hematuria, falls, loc, si, thoughts of self harm  Past Medical History:  Diagnosis Date  . Anemia   . Anxiety   . Depression    -she denies hx depression, but she had stress during mother's illness  . Migraine   . RAYNAUDS SYNDROME 01/06/2011   Qualifier: Diagnosis of  By: Arnoldo Morale MD, Balinda Quails   . Uterine fibroid    diagnosed by OB/GYN Dr Mayra Neer  . Varicose veins of left lower extremity with both ulcer of ankle and inflammation ([place]) 07/07/2011   Referral to interventional radiology for vascular study and potential  intervention     Past Surgical History:  Procedure Laterality Date  . CESAREAN SECTION    . HYSTEROSCOPY    . TONSILLECTOMY    . TONSILLECTOMY    . TUBAL LIGATION      Family History  Problem Relation Age of Onset  . COPD Mother   . Hyperlipidemia Mother   . Diabetes Mother   . Cancer Father     lung, smoker  . Diabetes Father   . Stroke Father   . Colon cancer Maternal Grandmother     Social History   Social History  . Marital status: Married    Spouse name: N/A  . Number of children: N/A  . Years of education: N/A   Occupational History  . [employer]  . Occupational psychologist    Social History Main Topics  . Smoking status: Never Smoker  . Smokeless tobacco: None  . Alcohol use No  . Drug use: No  . Sexual activity: Not Asked   Other Topics Concern  . None   Social History Narrative   Work or School: acounting       Home Situation: lives with husband and son 29 yo, and niece whom is 35 yo      Spiritual Beliefs: Christian      Lifestyle:trying to eat healthy; no regular exercise        Current Outpatient Prescriptions:  .  eletriptan (RELPAX) 40 MG tablet, TAKE 1 TABLET BY MOUTH AT ONSET OF HEADACHE MAY REPEAT IN 2 HOURS IF PERSISTS OR REOCCURS, Disp:  9 tablet, Rfl: 5 .  FLUoxetine (PROZAC) 20 MG capsule, Take 1 capsule (20 mg total) by mouth daily., Disp: 90 capsule, Rfl: 3 .  norethindrone ([name]) 0.35 MG tablet, Take 1 tablet by mouth daily., Disp: , Rfl:  .  ondansetron (ZOFRAN) 8 MG tablet, Take by mouth as needed for nausea or vomiting (migraines)., Disp: , Rfl:   EXAM:  Vitals:   09/20/16 1144  BP: 100/72  Pulse: 64  Temp: 98.2 F (36.8 C)    Body mass index is 22.64 kg/m.  GENERAL: vitals reviewed and listed above, alert, oriented, appears well hydrated and in no acute distress  HEENT: atraumatic, conjunttiva clear, no obvious abnormalities on inspection of external nose and ears  NECK: no obvious masses on  inspection  LUNGS: clear to auscultation bilaterally, no wheezes, rales or rhonchi, good air movement  CV: HRRR, no peripheral edema  MS: moves all extremities without noticeable abnormality  PSYCH: pleasant and cooperative, no obvious depression or anxiety  ASSESSMENT AND PLAN:  Discussed the following assessment and plan: More than 50% of 40 minutes spent in total in caring for this patient was spent face-to-face with the patient, counseling and/or coordinating care.   Other migraine without status migrainosus, not intractable - -most of her life-reports failed or did not tolerate many meds in the past including imitrex, maxalt, topamax, elavil in the past -refilled med, discussed options for potential cheaper rx  DDD (degenerative disc disease), lumbar Left hip pain Chronic pain of both knees -discussed implications, normal progression, treatment options -advised regular jt gentle exercise -prn anagesics after discussion risks, top capsaicin -f/u if worsening or persists  Anxiety -refilled medication after assessment status and need to continue  Uterine leiomyoma, unspecified location -discussed tx options - she apparently was having pain prior to OCPs -she plans to establish with gyn here, various options provided  Hyperglycemia -will plan labs at physical -lifestyle recs for reversal/tx prediabetes discussed  Establishing care with new doctor, encounter for -We reviewed the PMH, PSH, FH, SH, Meds and Allergies. -We provided refills for any medications we will prescribe as needed. -We addressed current concerns per orders and patient instructions. -We have asked for records for pertinent exams, studies, vaccines and notes from previous providers. -We have advised patient to follow up per instructions below.   Flu shot given  -Patient advised to return or notify a doctor immediately if symptoms worsen or persist or new concerns arise.  Patient Instructions  BEFORE  YOU LEAVE: -follow up: physical in 3 months, come fasting  Call to set up evaluation with a gynecologist.  We recommend the following healthy lifestyle for LIFE: 1) Small portions.   Tip: eat off of a salad plate instead of a dinner plate.  Tip: It is ok to feel hungry after a meal - that likely means you ate an appropriate portion.  Tip: if you need more or a snack choose fruits, veggies and/or a handful of nuts or seeds.  2) Eat a healthy clean diet.  * Tip: Avoid (less then 1 serving per week): processed foods, sweets, sweetened drinks, white starches (rice, flour, bread, potatoes, pasta, etc), red meat, fast foods, butter  *Tip: CHOOSE instead   * 5-9 servings per day of fresh or frozen fruits and vegetables (but not corn, potatoes, bananas, canned or dried fruit)   *nuts and seeds, beans   *olives and olive oil   *small portions of lean meats such as fish and white chicken    *  small portions of whole grains  3)Get at least 150 minutes of sweaty aerobic exercise per week.  4)Reduce stress - consider counseling, meditation and relaxation to balance other aspects of your life.      Colin Benton R.

## 2016-10-14 ENCOUNTER — Encounter: Payer: Self-pay | Admitting: Family Medicine

## 2016-12-14 NOTE — Progress Notes (Signed)
HPI:  Here for CPE:  -Concerns and/or follow up today:  REcently establish care here. PMH significant for migraines, OA, anxiety, fibroids - sees gyn (Dr.Law in [place]) and prediabetes. She has new new issues today. First of all she has had an itchy skin rash on her left hand and a patch on her right arm for a few weeks. This started out as one itchy papule, then she developed some other itchy papules. She has not been able to stop scratching. She does have several new pad, 2 puppies. She has wondered if something has been biting her. Also a coworker had an infestation of fleas. She feels like she hasn't had any new papules appear in the last week or so. She has some left lateral hip discomfort. This started a month or 2 ago. This is tall usually and sometimes sharp. Patient only has some clicking here. This is most painful when she lays on his side. No weakness, numbness, radiation of pain or known injury. Due for hep c and hiv screen, CCC, Tdap, ,mammo.  -Diet: variety of foods, balance and well rounded, larger portion sizes  -Exercise: no regular exercise  -Taking folic acid, vitamin D or calcium: no  -Diabetes and Dyslipidemia Screening: Fasting for labs today  -Hx of HTN: no  -Vaccines: UTD on tetanus booster per her report  -pap history: She will be seeing Dr. Julien Girt for her gynecology exam - already scheduled  -sexual activity: yes, female partner, no new partners  -wants STI testing (Hep C if born [birth year]): no did agree hepatitis C screening   -FH breast, colon or ovarian ca: see FH Last mammogram: will be seeing gynecology for breast exam, women's health and mammogram  Last colon cancer screening:After discussion of various options, she would like to do the Cologuard test for colon cancer screening   -Alcohol, Tobacco, drug use: see social history  Review of Systems - no fevers, unintentional weight loss, vision loss, hearing loss, chest pain, sob, hemoptysis, melena,  hematochezia, hematuria, genital discharge, changing or concerning skin lesions, bleeding, bruising, loc, thoughts of self harm or SI  Past Medical History:  Diagnosis Date  . Anemia   . Anxiety   . Depression    -she denies hx depression, but she had stress during mother's illness  . Migraine   . RAYNAUDS SYNDROME 01/06/2011   Qualifier: Diagnosis of  By: Arnoldo Morale MD, Balinda Quails   . Uterine fibroid    diagnosed by OB/GYN Dr Mayra Neer  . Varicose veins of left lower extremity with both ulcer of ankle and inflammation ([place]) 07/07/2011   Referral to interventional radiology for vascular study and potential intervention     Past Surgical History:  Procedure Laterality Date  . CESAREAN SECTION    . HYSTEROSCOPY    . TONSILLECTOMY    . TONSILLECTOMY    . TUBAL LIGATION      Family History  Problem Relation Age of Onset  . COPD Mother   . Hyperlipidemia Mother   . Diabetes Mother   . Cancer Father     lung, smoker  . Diabetes Father   . Stroke Father   . Colon cancer Maternal Grandmother     Social History   Social History  . Marital status: Married    Spouse name: N/A  . Number of children: N/A  . Years of education: N/A   Occupational History  . [employer]  . Occupational psychologist    Social History Main Topics  .  Smoking status: Never Smoker  . Smokeless tobacco: None  . Alcohol use No  . Drug use: No  . Sexual activity: Not Asked   Other Topics Concern  . None   Social History Narrative   Work or School: acounting       Home Situation: lives with husband and son 54 yo, and niece whom is 73 yo      Spiritual Beliefs: Christian      Lifestyle:trying to eat healthy; no regular exercise        Current Outpatient Prescriptions:  .  eletriptan (RELPAX) 40 MG tablet, TAKE 1 TABLET BY MOUTH AT ONSET OF HEADACHE MAY REPEAT IN 2 HOURS IF PERSISTS OR REOCCURS, Disp: 9 tablet, Rfl: 5 .  FLUoxetine (PROZAC) 20 MG capsule, Take 1 capsule (20 mg total) by  mouth daily., Disp: 90 capsule, Rfl: 3 .  norethindrone ([name]) 0.35 MG tablet, Take 1 tablet by mouth daily., Disp: , Rfl:  .  ondansetron (ZOFRAN) 8 MG tablet, Take by mouth as needed for nausea or vomiting (migraines)., Disp: , Rfl:   EXAM:  Vitals:   12/15/16 0827  Pulse: 70  Temp: 97.9 F (36.6 C)    GENERAL: vitals reviewed and listed below, alert, oriented, appears well hydrated and in no acute distress  HEENT: head atraumatic, PERRLA, normal appearance of eyes, ears, nose and mouth. moist mucus membranes.  NECK: supple, no masses or lymphadenopathy  LUNGS: clear to auscultation bilaterally, no rales, rhonchi or wheeze  CV: HRRR, no peripheral edema or cyanosis, normal pedal pulses  ABDOMEN: bowel sounds normal, soft, non tender to palpation, no masses, no rebound or guarding  GU/BREAST: Declined, seeing gynecologist  SKIN: no rash or abnormal lesions; several erythematous excoriated papules in the healing stage on the left hand and forearm; a few such papules on the right upper arm - she declined full skin exam    MS: normal gait, moves all extremities normally, gait is normal, normal inspection of the hips and low back and lower extremities, she has some tenderness to palpation over the greater trochanteric bursa on the left, normal hip exam otherwise with negative compression test normal external and internal rotation of the hip and normal functioning of the lower extremities bilaterally   NEURO: normal gait, speech and thought processing grossly intact, muscle tone grossly intact throughout  PSYCH: normal affect, pleasant and cooperative  ASSESSMENT AND PLAN:  Discussed the following assessment and plan:  Visit for preventive health examination - Plan: Lipid Panel, Hepatitis C antibody -Discussed and advised all [place] preventive services health task force level A and B recommendations for age, sex and risks. -Advised at least 150 minutes of exercise per week and a  healthy diet with avoidance of (less then 1 serving per week) processed foods, white starches, red meat, fast foods and sweets and consisting of: * 5-9 servings of fresh fruits and vegetables (not corn or potatoes) *nuts and seeds, beans *olives and olive oil *lean meats such as fish and white chicken  *whole grains -seeing gyn for women's health exam  -labs, studies and vaccines per orders this encounter  Left hip pain -suspect tight IT band with mild bursitis -we discussed possible serious and likely etiologies, workup and treatment, treatment risks and return precautions -after this discussion, Adelai opted for HEP, ice -follow up advised 1 month -of course, we advised Debbera  to return or notify a doctor immediately if symptoms worsen or persist or new concerns arise.  Rash and nonspecific  skin eruption -suspect insect bites -seems not recent new lesions, she is having exterminator come to home -topical hydrocortisone cream -follow up 1 month  Elevated blood pressure reading without diagnosis of hypertension - Plan: Basic metabolic panel -recheck in 1 month -lifestyle recs  Hyperglycemia - Plan: Hemoglobin A1c -lifestyle recs  Orders Placed This Encounter  Procedures  . Lipid Panel  . Hemoglobin A1c  . Basic metabolic panel  . Hepatitis C antibody    Patient advised to return to clinic immediately if symptoms worsen or persist or new concerns.  Patient Instructions  BEFORE YOU LEAVE: -ID band exercises/greater trochanteric bursitis -labs -order cologuard -follow up: 1 month for HTN, hip pain, rash  We ordered the Cologuard test for colon cancer screening. Please complete this test promptly once the kit arrives. Please contact us if you have not received your kit in the next few weeks.  Vit D3 [phone] IU daily  Do the exercises provided 4 days per week for the hip. Avoid sleeping on side. Ice as needed if sore.  Hydrocortisone cream (available over the  counter) for itch and do not scratch rash.   Please let us know the date of your last tetanus shot.  We have ordered labs or studies at this visit. It can take up to 1-2 weeks for results and processing. IF results require follow up or explanation, we will call you with instructions. Clinically stable results will be released to your [facility]. If you have not heard from [place] or cannot find your results in [facility] in 2 weeks please contact our office at [phone].  If you are not yet signed up for [facility], please consider signing up.   We recommend the following healthy lifestyle for LIFE: 1) Small portions.   Tip: eat off of a salad plate instead of a dinner plate.  Tip: It is ok to feel hungry after a meal - that likely means you ate an appropriate portion.  Tip: if you need more or a snack choose fruits, veggies and/or a handful of nuts or seeds.  2) Eat a healthy clean diet.  * Tip: Avoid (less then 1 serving per week): processed foods, sweets, sweetened drinks, white starches (rice, flour, bread, potatoes, pasta, etc), red meat, fast foods, butter  *Tip: CHOOSE instead   * 5-9 servings per day of fresh or frozen fruits and vegetables (but not corn, potatoes, bananas, canned or dried fruit)   *nuts and seeds, beans   *olives and olive oil   *small portions of lean meats such as fish and white chicken    *small portions of whole grains  3)Get at least 150 minutes of sweaty aerobic exercise per week.  4)Reduce stress - consider counseling, meditation and relaxation to balance other aspects of your life.            No Follow-up on file.  Colin Benton R., DO

## 2016-12-15 ENCOUNTER — Ambulatory Visit (INDEPENDENT_AMBULATORY_CARE_PROVIDER_SITE_OTHER): Payer: Commercial Managed Care - HMO | Admitting: Family Medicine

## 2016-12-15 ENCOUNTER — Encounter: Payer: Self-pay | Admitting: Family Medicine

## 2016-12-15 VITALS — HR 70 | Temp 97.9°F | Ht 61.75 in | Wt 124.2 lb

## 2016-12-15 DIAGNOSIS — R21 Rash and other nonspecific skin eruption: Secondary | ICD-10-CM | POA: Diagnosis not present

## 2016-12-15 DIAGNOSIS — Z0000 Encounter for general adult medical examination without abnormal findings: Secondary | ICD-10-CM | POA: Diagnosis not present

## 2016-12-15 DIAGNOSIS — R030 Elevated blood-pressure reading, without diagnosis of hypertension: Secondary | ICD-10-CM

## 2016-12-15 DIAGNOSIS — R739 Hyperglycemia, unspecified: Secondary | ICD-10-CM

## 2016-12-15 DIAGNOSIS — M25552 Pain in left hip: Secondary | ICD-10-CM | POA: Diagnosis not present

## 2016-12-15 LAB — BASIC METABOLIC PANEL
BUN: 14 mg/dL (ref 6–23)
CALCIUM: 9 mg/dL (ref 8.4–10.5)
CHLORIDE: 105 meq/L (ref 96–112)
CO2: 29 mEq/L (ref 19–32)
CREATININE: 0.65 mg/dL (ref 0.40–1.20)
GFR: 101.42 mL/min (ref >60.00)
Glucose, Bld: 84 mg/dL (ref 70–99)
Potassium: 4.1 mEq/L (ref 3.5–5.1)
Sodium: 139 mEq/L (ref 135–145)

## 2016-12-15 LAB — LIPID PANEL
Cholesterol: 152 mg/dL (ref 0–200)
HDL: 57.6 mg/dL (ref >39.00)
LDL CALC: 80 mg/dL (ref 0–99)
NONHDL: 93.98
Total CHOL/HDL Ratio: 3
Triglycerides: 70 mg/dL (ref 0.0–149.0)
VLDL: 14 mg/dL (ref 0.0–40.0)

## 2016-12-15 LAB — HEMOGLOBIN A1C: Hgb A1c MFr Bld: 5.4 % (ref 4.6–6.5)

## 2016-12-15 NOTE — Progress Notes (Signed)
Pre visit review using our clinic review tool, if applicable. No additional management support is needed unless otherwise documented below in the visit note. 

## 2016-12-15 NOTE — Patient Instructions (Signed)
BEFORE YOU LEAVE: -ID band exercises/greater trochanteric bursitis -labs -order cologuard -follow up: 1 month for HTN, hip pain, rash  We ordered the Cologuard test for colon cancer screening. Please complete this test promptly once the kit arrives. Please contact us if you have not received your kit in the next few weeks.  Vit D3 [phone] IU daily  Do the exercises provided 4 days per week for the hip. Avoid sleeping on side. Ice as needed if sore.  Hydrocortisone cream (available over the counter) for itch and do not scratch rash.   Please let us know the date of your last tetanus shot.  We have ordered labs or studies at this visit. It can take up to 1-2 weeks for results and processing. IF results require follow up or explanation, we will call you with instructions. Clinically stable results will be released to your [facility]. If you have not heard from [place] or cannot find your results in [facility] in 2 weeks please contact our office at [phone].  If you are not yet signed up for [facility], please consider signing up.   We recommend the following healthy lifestyle for LIFE: 1) Small portions.   Tip: eat off of a salad plate instead of a dinner plate.  Tip: It is ok to feel hungry after a meal - that likely means you ate an appropriate portion.  Tip: if you need more or a snack choose fruits, veggies and/or a handful of nuts or seeds.  2) Eat a healthy clean diet.  * Tip: Avoid (less then 1 serving per week): processed foods, sweets, sweetened drinks, white starches (rice, flour, bread, potatoes, pasta, etc), red meat, fast foods, butter  *Tip: CHOOSE instead   * 5-9 servings per day of fresh or frozen fruits and vegetables (but not corn, potatoes, bananas, canned or dried fruit)   *nuts and seeds, beans   *olives and olive oil   *small portions of lean meats such as fish and white chicken    *small portions of whole grains  3)Get at least 150 minutes of sweaty aerobic exercise per  week.  4)Reduce stress - consider counseling, meditation and relaxation to balance other aspects of your life.

## 2016-12-16 LAB — HEPATITIS C ANTIBODY: HCV Ab: NEGATIVE

## 2016-12-25 DIAGNOSIS — L309 Dermatitis, unspecified: Secondary | ICD-10-CM | POA: Diagnosis not present

## 2017-01-09 DIAGNOSIS — Z1211 Encounter for screening for malignant neoplasm of colon: Secondary | ICD-10-CM | POA: Diagnosis not present

## 2017-01-09 DIAGNOSIS — Z1212 Encounter for screening for malignant neoplasm of rectum: Secondary | ICD-10-CM | POA: Diagnosis not present

## 2017-01-09 LAB — COLOGUARD: COLOGUARD: NEGATIVE

## 2017-01-12 ENCOUNTER — Ambulatory Visit: Payer: Commercial Managed Care - HMO | Admitting: Family Medicine

## 2017-01-17 ENCOUNTER — Encounter: Payer: Self-pay | Admitting: Family Medicine

## 2017-02-28 ENCOUNTER — Encounter: Payer: Self-pay | Admitting: Family Medicine

## 2017-02-28 ENCOUNTER — Ambulatory Visit (INDEPENDENT_AMBULATORY_CARE_PROVIDER_SITE_OTHER): Payer: Commercial Managed Care - HMO | Admitting: Family Medicine

## 2017-02-28 VITALS — BP 100/70 | HR 62 | Temp 98.1°F | Resp 16 | Ht 61.0 in | Wt 123.0 lb

## 2017-02-28 DIAGNOSIS — J069 Acute upper respiratory infection, unspecified: Secondary | ICD-10-CM | POA: Diagnosis not present

## 2017-02-28 DIAGNOSIS — B9789 Other viral agents as the cause of diseases classified elsewhere: Secondary | ICD-10-CM | POA: Diagnosis not present

## 2017-02-28 MED ORDER — FLUTICASONE PROPIONATE 50 MCG/ACT NA SUSP: 2.0000 | Freq: Every day | NASAL | 1 refills | Status: DC

## 2017-02-28 NOTE — Progress Notes (Signed)
Pre visit review using our clinic review tool, if applicable. No additional management support is needed unless otherwise documented below in the visit note. 

## 2017-02-28 NOTE — Patient Instructions (Addendum)
Try Allegra over the counter and we will send in nose spray.  If not improving by Friday please let us know.   Upper Respiratory Infection, Adult Most upper respiratory infections (URIs) are a viral infection of the air passages leading to the lungs. A URI affects the nose, throat, and upper air passages. The most common type of URI is nasopharyngitis and is typically referred to as "the common cold." URIs run their course and usually go away on their own. Most of the time, a URI does not require medical attention, but sometimes a bacterial infection in the upper airways can follow a viral infection. This is called a secondary infection. Sinus and middle ear infections are common types of secondary upper respiratory infections. Bacterial pneumonia can also complicate a URI. A URI can worsen asthma and chronic obstructive pulmonary disease (COPD). Sometimes, these complications can require emergency medical care and may be life threatening. What are the causes? Almost all URIs are caused by viruses. A virus is a type of germ and can spread from one person to another. What increases the risk? You may be at risk for a URI if:  You smoke.  You have chronic heart or lung disease.  You have a weakened defense (immune) system.  You are very young or very old.  You have nasal allergies or asthma.  You work in crowded or poorly ventilated areas.  You work in health care facilities or schools. What are the signs or symptoms? Symptoms typically develop 2-3 days after you come in contact with a cold virus. Most viral URIs last 7-10 days. However, viral URIs from the influenza virus (flu virus) can last 14-18 days and are typically more severe. Symptoms may include:  Runny or stuffy (congested) nose.  Sneezing.  Cough.  Sore throat.  Headache.  Fatigue.  Fever.  Loss of appetite.  Pain in your forehead, behind your eyes, and over your cheekbones (sinus pain).  Muscle aches. How is  this diagnosed? Your health care provider may diagnose a URI by:  Physical exam.  Tests to check that your symptoms are not due to another condition such as:  Strep throat.  Sinusitis.  Pneumonia.  Asthma. How is this treated? A URI goes away on its own with time. It cannot be cured with medicines, but medicines may be prescribed or recommended to relieve symptoms. Medicines may help:  Reduce your fever.  Reduce your cough.  Relieve nasal congestion. Follow these instructions at home:  Take medicines only as directed by your health care provider.  Gargle warm saltwater or take cough drops to comfort your throat as directed by your health care provider.  Use a warm mist humidifier or inhale steam from a shower to increase air moisture. This may make it easier to breathe.  Drink enough fluid to keep your urine clear or pale yellow.  Eat soups and other clear broths and maintain good nutrition.  Rest as needed.  Return to work when your temperature has returned to normal or as your health care provider advises. You may need to stay home longer to avoid infecting others. You can also use a face mask and careful hand washing to prevent spread of the virus.  Increase the usage of your inhaler if you have asthma.  Do not use any tobacco products, including cigarettes, chewing tobacco, or electronic cigarettes. If you need help quitting, ask your health care provider. How is this prevented? The best way to protect yourself from getting a  cold is to practice good hygiene.  Avoid oral or hand contact with people with cold symptoms.  Wash your hands often if contact occurs. There is no clear evidence that vitamin C, vitamin E, echinacea, or exercise reduces the chance of developing a cold. However, it is always recommended to get plenty of rest, exercise, and practice good nutrition. Contact a health care provider if:  You are getting worse rather than better.  Your symptoms  are not controlled by medicine.  You have chills.  You have worsening shortness of breath.  You have brown or red mucus.  You have yellow or brown nasal discharge.  You have pain in your face, especially when you bend forward.  You have a fever.  You have swollen neck glands.  You have pain while swallowing.  You have white areas in the back of your throat. Get help right away if:  You have severe or persistent:  Headache.  Ear pain.  Sinus pain.  Chest pain.  You have chronic lung disease and any of the following:  Wheezing.  Prolonged cough.  Coughing up blood.  A change in your usual mucus.  You have a stiff neck.  You have changes in your:  Vision.  Hearing.  Thinking.  Mood. This information is not intended to replace advice given to you by your health care provider. Make sure you discuss any questions you have with your health care provider. Document Released: 05/31/2001 Document Revised: 08/07/2016 Document Reviewed: 03/12/2014 Elsevier Interactive Patient Education  2017 [employer].

## 2017-02-28 NOTE — Progress Notes (Signed)
Patient ID: Crystal Dillon, female   DOB: June 13, 1966, 51 y.o.   MRN: [id number]     Subjective:    Patient ID: Crystal Dillon, female    DOB: Mar 24, 1966, 51 y.o.   MRN: [id number]  Chief Complaint  Patient presents with  . Otalgia    right side    Otalgia   There is pain in the left ear. This is a new problem. The current episode started yesterday. There has been no fever. The pain is mild. Associated symptoms include a sore throat. Associated symptoms comments: Swelling . She has tried acetaminophen (mucinex.  alka seltzer cold and sinus) for the symptoms. The treatment provided mild relief.   the other side started hurting last week and then improved and the Left side started yesterday.   Patient is in today for ear pain on right side.  Patient Care Team: Ann Held, DO as PCP - General (Family Medicine)   Past Medical History:  Diagnosis Date  . Anemia   . Anxiety   . Depression    -she denies hx depression, but she had stress during mother's illness  . Migraine   . RAYNAUDS SYNDROME 01/06/2011   Qualifier: Diagnosis of  By: Arnoldo Morale MD, Balinda Quails   . Uterine fibroid    diagnosed by OB/GYN Dr Mayra Neer  . Varicose veins of left lower extremity with both ulcer of ankle and inflammation ([place]) 07/07/2011   Referral to interventional radiology for vascular study and potential intervention     Past Surgical History:  Procedure Laterality Date  . CESAREAN SECTION    . HYSTEROSCOPY    . TONSILLECTOMY    . TONSILLECTOMY    . TUBAL LIGATION      Family History  Problem Relation Age of Onset  . COPD Mother   . Hyperlipidemia Mother   . Diabetes Mother   . Cancer Father     lung, smoker  . Diabetes Father   . Stroke Father   . Colon cancer Maternal Grandmother     Social History   Social History  . Marital status: Married    Spouse name: N/A  . Number of children: N/A  . Years of education: N/A   Occupational History  . [employer]  .  Occupational psychologist    Social History Main Topics  . Smoking status: Never Smoker  . Smokeless tobacco: Never Used  . Alcohol use No  . Drug use: No  . Sexual activity: Not on file   Other Topics Concern  . Not on file   Social History Narrative   Work or School: acounting       Home Situation: lives with husband and son 12 yo, and niece whom is 85 yo      Spiritual Beliefs: Christian      Lifestyle:trying to eat healthy; no regular exercise       Outpatient Medications Prior to Visit  Medication Sig Dispense Refill  . eletriptan (RELPAX) 40 MG tablet TAKE 1 TABLET BY MOUTH AT ONSET OF HEADACHE MAY REPEAT IN 2 HOURS IF PERSISTS OR REOCCURS 9 tablet 5  . FLUoxetine (PROZAC) 20 MG capsule Take 1 capsule (20 mg total) by mouth daily. 90 capsule 3  . norethindrone ([name]) 0.35 MG tablet Take 1 tablet by mouth daily.    . ondansetron (ZOFRAN) 8 MG tablet Take by mouth as needed for nausea or vomiting (migraines).     No facility-administered medications prior to visit.  No Known Allergies  Review of Systems  HENT: Positive for ear pain and sore throat.        Objective:    Physical Exam  Constitutional: She is oriented to person, place, and time. She appears well-developed and well-nourished.  HENT:  Head: Normocephalic and atraumatic.  Right Ear: Hearing, external ear and ear canal normal. Tympanic membrane is not injected and not scarred. Tympanic membrane mobility is abnormal.  Left Ear: Hearing, external ear and ear canal normal. Tympanic membrane is not injected and not scarred. Tympanic membrane mobility is abnormal.  Ears:  Eyes: Conjunctivae and EOM are normal.  Neck: Normal range of motion. Neck supple. No JVD present. Carotid bruit is not present. No thyromegaly present.  Cardiovascular: Normal rate, regular rhythm and normal heart sounds.   No murmur heard. Pulmonary/Chest: Effort normal and breath sounds normal. No respiratory distress. She has no wheezes.  She has no rales. She exhibits no tenderness.  Musculoskeletal: She exhibits no edema.  Lymphadenopathy:    She has cervical adenopathy.  Neurological: She is alert and oriented to person, place, and time.  Psychiatric: She has a normal mood and affect.  Nursing note and vitals reviewed.   BP 100/70 (BP Location: Left Arm, Cuff Size: Normal)   Pulse 62   Temp 98.1 F (36.7 C) (Oral)   Resp 16   Ht 5\' 1"  (1.549 m)   Wt 123 lb (55.8 kg)   LMP 07/06/2011   SpO2 98%   BMI 23.24 kg/m  Wt Readings from Last 3 Encounters:  02/28/17 123 lb (55.8 kg)  12/15/16 124 lb 3.2 oz (56.3 kg)  09/20/16 119 lb 12.8 oz (54.3 kg)     Lab Results  Component Value Date   WBC 4.0 (L) 10/18/2012   HGB 12.3 10/18/2012   HCT 37.6 10/18/2012   PLT 249.0 10/18/2012   GLUCOSE 84 12/15/2016   CHOL 152 12/15/2016   TRIG 70.0 12/15/2016   HDL 57.60 12/15/2016   LDLCALC 80 12/15/2016   ALT 19 10/18/2012   AST 17 10/18/2012   NA 139 12/15/2016   K 4.1 12/15/2016   CL 105 12/15/2016   CREATININE 0.65 12/15/2016   BUN 14 12/15/2016   CO2 29 12/15/2016   TSH 0.99 04/26/2013   HGBA1C 5.4 12/15/2016    Lab Results  Component Value Date   TSH 0.99 04/26/2013   Lab Results  Component Value Date   WBC 4.0 (L) 10/18/2012   HGB 12.3 10/18/2012   HCT 37.6 10/18/2012   MCV 92.2 10/18/2012   PLT 249.0 10/18/2012   Lab Results  Component Value Date   NA 139 12/15/2016   K 4.1 12/15/2016   CO2 29 12/15/2016   GLUCOSE 84 12/15/2016   BUN 14 12/15/2016   CREATININE 0.65 12/15/2016   BILITOT 0.5 10/18/2012   ALKPHOS 75 10/18/2012   AST 17 10/18/2012   ALT 19 10/18/2012   PROT 7.2 10/18/2012   ALBUMIN 3.8 10/18/2012   CALCIUM 9.0 12/15/2016   GFR 101.42 12/15/2016   Lab Results  Component Value Date   CHOL 152 12/15/2016   Lab Results  Component Value Date   HDL 57.60 12/15/2016   Lab Results  Component Value Date   LDLCALC 80 12/15/2016   Lab Results  Component Value Date    TRIG 70.0 12/15/2016   Lab Results  Component Value Date   CHOLHDL 3 12/15/2016   Lab Results  Component Value Date   HGBA1C 5.4 12/15/2016  Assessment & Plan:   Problem List Items Addressed This Visit      Unprioritized   Upper respiratory infection - Primary    flonase and allegra Check cbcd Call if symptoms do not improve      Relevant Orders   CBC with Differential/Platelet      I am having Ms. Xin start on fluticasone. I am also having her maintain her ondansetron, norethindrone, FLUoxetine, eletriptan, and cyclobenzaprine.  Meds ordered this encounter  Medications  . cyclobenzaprine (FLEXERIL) 10 MG tablet  . fluticasone (FLONASE) 50 MCG/ACT nasal spray    Sig: Place 2 sprays into both nostrils daily.    Dispense:  16 g    Refill:  1    CMA served as scribe during this visit. History, Physical and Plan performed by medical provider. Documentation and orders reviewed and attested to.  Ann Held, DO

## 2017-02-28 NOTE — Assessment & Plan Note (Signed)
flonase and allegra Check cbcd Call if symptoms do not improve

## 2017-03-01 LAB — CBC WITH DIFFERENTIAL/PLATELET
BASOS PCT: 0.6 % (ref 0.0–3.0)
Basophils Absolute: 0 10*3/uL (ref 0.0–0.1)
EOS ABS: 0 10*3/uL (ref 0.0–0.7)
Eosinophils Relative: 0.6 % (ref 0.0–5.0)
HEMATOCRIT: 36.4 % (ref 36.0–46.0)
Hemoglobin: 12.3 g/dL (ref 12.0–15.0)
LYMPHS PCT: 33.5 % (ref 12.0–46.0)
Lymphs Abs: 2.2 10*3/uL (ref 0.7–4.0)
MCHC: 33.8 g/dL (ref 30.0–36.0)
MCV: 89.5 fl (ref 78.0–100.0)
Monocytes Absolute: 0.4 10*3/uL (ref 0.1–1.0)
Monocytes Relative: 6.2 % (ref 3.0–12.0)
Neutro Abs: 3.8 10*3/uL (ref 1.4–7.7)
Neutrophils Relative %: 59.1 % (ref 43.0–77.0)
PLATELETS: 282 10*3/uL (ref 150.0–400.0)
RBC: 4.07 Mil/uL (ref 3.87–5.11)
RDW: 14 % (ref 11.5–15.5)
WBC: 6.4 10*3/uL (ref 4.0–10.5)

## 2017-06-27 ENCOUNTER — Inpatient Hospital Stay: Admit: 2017-06-27 | Payer: Commercial Managed Care - HMO | Admitting: Obstetrics and Gynecology

## 2017-06-27 SURGERY — HYSTERECTOMY, TOTAL, ABDOMINAL, WITH SALPINGECTOMY
Anesthesia: Choice | Laterality: Bilateral

## 2017-09-11 ENCOUNTER — Other Ambulatory Visit: Payer: Self-pay | Admitting: Family Medicine

## 2017-09-22 ENCOUNTER — Other Ambulatory Visit: Payer: Self-pay | Admitting: Family Medicine

## 2017-09-22 NOTE — Telephone Encounter (Signed)
Chart says she established at [place]--- if that is true this should go to Dr Maudie Mercury

## 2017-09-22 NOTE — Telephone Encounter (Signed)
I left a detailed message at the pts cell number and asked that she call the office for an appt.  Rx denial sent to the pharmacy.

## 2017-09-22 NOTE — Telephone Encounter (Signed)
Sent to [facility]/thx dmf

## 2017-09-22 NOTE — Telephone Encounter (Signed)
Please call patient. We received her request regarding her migraine medication.Needs appointment.We need to recheck her blood pressure and have not seen her in quite some time.

## 2017-10-03 ENCOUNTER — Ambulatory Visit (INDEPENDENT_AMBULATORY_CARE_PROVIDER_SITE_OTHER): Payer: 59 | Admitting: Family Medicine

## 2017-10-03 ENCOUNTER — Encounter: Payer: Self-pay | Admitting: Family Medicine

## 2017-10-03 VITALS — BP 116/80 | HR 64 | Temp 98.4°F | Ht 61.0 in | Wt 127.0 lb

## 2017-10-03 DIAGNOSIS — G43809 Other migraine, not intractable, without status migrainosus: Secondary | ICD-10-CM | POA: Diagnosis not present

## 2017-10-03 DIAGNOSIS — R21 Rash and other nonspecific skin eruption: Secondary | ICD-10-CM | POA: Diagnosis not present

## 2017-10-03 DIAGNOSIS — Z23 Encounter for immunization: Secondary | ICD-10-CM | POA: Diagnosis not present

## 2017-10-03 DIAGNOSIS — F411 Generalized anxiety disorder: Secondary | ICD-10-CM | POA: Diagnosis not present

## 2017-10-03 DIAGNOSIS — Z7185 Encounter for immunization safety counseling: Secondary | ICD-10-CM

## 2017-10-03 DIAGNOSIS — Z7189 Other specified counseling: Secondary | ICD-10-CM

## 2017-10-03 MED ORDER — TRIAMCINOLONE ACETONIDE 0.1 % EX CREA
1.0000 | TOPICAL_CREAM | Freq: Two times a day (BID) | CUTANEOUS | 0 refills | Status: DC
Start: 2017-10-03 — End: 2019-01-22

## 2017-10-03 MED ORDER — ELETRIPTAN HYDROBROMIDE 40 MG PO TABS: ORAL_TABLET | ORAL | 5 refills | Status: DC

## 2017-10-03 NOTE — Patient Instructions (Addendum)
BEFORE YOU LEAVE: -flu shot -tetanus booster -follow up: 6 months  Call the number provided to set up appointment with dermatology about the rash. Can use the new cream that I sent to your pharmacy 1-2 times daily for 1-2 weeks if needed.  Call to ensure your mammogram is set up.   WE NOW OFFER   [facility] FAST TRACK!!!  SAME DAY Appointments for ACUTE CARE  Such as: Sprains, Injuries, cuts, abrasions, rashes, muscle pain, joint pain, back pain Colds, flu, sore throats, headache, allergies, cough, fever  Ear pain, sinus and eye infections Abdominal pain, nausea, vomiting, diarrhea, upset stomach Animal/insect bites  3 Easy Ways to Schedule: Walk-In Scheduling Call in scheduling Mychart Sign-up: [email or web address]

## 2017-10-03 NOTE — Progress Notes (Signed)
HPI:  Crystal Dillon is a pleasant 51 y.o. here for follow up. Chronic medical problems summarized below were reviewed for changes. Reports she is doing well. Reports mood and migraines are stable. She does need a refill on her Relpax. She has a new complaint of a rash on her arms and leg. She reports she gets this usually once a year in the fall. It starts out as itchy blisters, which then become punched out itchy ulcers. These eventually heal with the steroid cream. She reports she saw a dermatologist remotely about this and had a biopsy, but that no diagnosis was revealed. She currently has a flare of these lesions and wants a refill on triamcinolone cream. Denies CP, SOB, DOE, treatment intolerance or new symptoms. Due for flu shot, mammo, tdap  Migraines: -her whole life -relpax prn and ibuprfen -has tried imitrex, maxalt, topamax and amitryptyline and these do not work or have side effects -unchanged -requests refill on relpax  Reporte hx OA/DDD: -back, knees, L hip -occ flares of pain in these areas -not getting any exercise recently  Hx anxiety: -prozac 20mg  daily -stable for a long time, also helps the headaches, would like to continue this medication -no hx depression or SI  Fibroids/pain: -postmenopausal -see Dr. Lanny Cramp in [place] and placed on camila for the fibroid related pain per her report -seeing gyn  Hx borderline hyperglycemia: -borderline prediabetes -eating healthier and trying to exercise - no regular exercise now  ROS: See pertinent positives and negatives per HPI.  Past Medical History:  Diagnosis Date  . Anemia   . Anxiety   . Depression    -she denies hx depression, but she had stress during mother's illness  . Migraine   . RAYNAUDS SYNDROME 01/06/2011   Qualifier: Diagnosis of  By: Arnoldo Morale MD, Balinda Quails   . Uterine fibroid    diagnosed by OB/GYN Dr Mayra Neer  . Varicose veins of left lower extremity with both ulcer of ankle and inflammation  ([place]) 07/07/2011   Referral to interventional radiology for vascular study and potential intervention     Past Surgical History:  Procedure Laterality Date  . CESAREAN SECTION    . HYSTEROSCOPY    . TONSILLECTOMY    . TONSILLECTOMY    . TUBAL LIGATION      Family History  Problem Relation Age of Onset  . COPD Mother   . Hyperlipidemia Mother   . Diabetes Mother   . Cancer Father        lung, smoker  . Diabetes Father   . Stroke Father   . Colon cancer Maternal Grandmother     Social History   Social History  . Marital status: Married    Spouse name: N/A  . Number of children: N/A  . Years of education: N/A   Occupational History  . [employer]  . Occupational psychologist    Social History Main Topics  . Smoking status: Never Smoker  . Smokeless tobacco: Never Used  . Alcohol use No  . Drug use: No  . Sexual activity: Not Asked   Other Topics Concern  . None   Social History Narrative   Work or School: acounting       Home Situation: lives with husband and son 6 yo, and niece whom is 38 yo      Spiritual Beliefs: Christian      Lifestyle:trying to eat healthy; no regular exercise        Current Outpatient Prescriptions:  .  cyclobenzaprine (FLEXERIL) 10 MG tablet, , Disp: , Rfl:  .  eletriptan (RELPAX) 40 MG tablet, TAKE 1 TABLET BY MOUTH AT ONSET OF HEADACHE MAY REPEAT IN 2 HOURS IF PERSISTS OR REOCCURS, Disp: 9 tablet, Rfl: 5 .  FLUoxetine (PROZAC) 20 MG capsule, TAKE 1 CAPSULE EVERY DAY, Disp: 90 capsule, Rfl: 3 .  fluticasone (FLONASE) 50 MCG/ACT nasal spray, Place 2 sprays into both nostrils daily., Disp: 16 g, Rfl: 1 .  norethindrone ([name]) 0.35 MG tablet, Take 1 tablet by mouth daily., Disp: , Rfl:  .  ondansetron (ZOFRAN) 8 MG tablet, Take by mouth as needed for nausea or vomiting (migraines)., Disp: , Rfl:  .  triamcinolone cream (KENALOG) 0.1 %, Apply 1 application topically 2 (two) times daily., Disp: 30 g, Rfl: 0  EXAM:  Vitals:    10/03/17 0924  BP: 116/80  Pulse: 64  Temp: 98.4 F (36.9 C)    Body mass index is 24 kg/m.  GENERAL: vitals reviewed and listed above, alert, oriented, appears well hydrated and in no acute distress  HEENT: atraumatic, conjunttiva clear, no obvious abnormalities on inspection of external nose and ears  NECK: no obvious masses on inspection  LUNGS: clear to auscultation bilaterally, no wheezes, rales or rhonchi, good air movement  CV: HRRR, no peripheral edema  MS: moves all extremities without noticeable abnormality  SKIN: several small healing superficial ulcer like lesions on the hands and wrists and left lower ankle  PSYCH: pleasant and cooperative, no obvious depression or anxiety  ASSESSMENT AND PLAN:  Discussed the following assessment and plan:  Skin rash -a 7 on a., Given they started out as a blister and only occur at a certain time of the year and wonder if this is a form of contact dermatitis. I do think another evaluation with the dermatologist as warranted. Did refill her triamcinolone cream. She plans to call the dermatologist for appointment.  Vaccine counseling -She wishes to get her flu shot and tetanus booster today  Other migraine without status migrainosus, not intractable -Stable, medication refilled  Anxiety state -Stable  -Patient advised to return or notify a doctor immediately if symptoms worsen or persist or new concerns arise.  Patient Instructions  BEFORE YOU LEAVE: -flu shot -tetanus booster -follow up: 6 months  Call the number provided to set up appointment with dermatology about the rash. Can use the new cream that I sent to your pharmacy 1-2 times daily for 1-2 weeks if needed.  Call to ensure your mammogram is set up.   WE NOW OFFER   [facility] FAST TRACK!!!  SAME DAY Appointments for ACUTE CARE  Such as: Sprains, Injuries, cuts, abrasions, rashes, muscle pain, joint pain, back pain Colds, flu, sore  throats, headache, allergies, cough, fever  Ear pain, sinus and eye infections Abdominal pain, nausea, vomiting, diarrhea, upset stomach Animal/insect bites  3 Easy Ways to Schedule: Walk-In Scheduling Call in scheduling Mychart Sign-up: [email or web address]           Colin Benton R., DO

## 2017-10-03 NOTE — Addendum Note (Signed)
Addended by: Agnes Lawrence on: 10/03/2017 10:03 AM   Modules accepted: Orders

## 2017-12-28 ENCOUNTER — Encounter: Payer: Self-pay | Admitting: Family Medicine

## 2018-01-11 DIAGNOSIS — Z01419 Encounter for gynecological examination (general) (routine) without abnormal findings: Secondary | ICD-10-CM | POA: Diagnosis not present

## 2018-01-11 DIAGNOSIS — Z6823 Body mass index (BMI) 23.0-23.9, adult: Secondary | ICD-10-CM | POA: Diagnosis not present

## 2018-01-22 DIAGNOSIS — Z1231 Encounter for screening mammogram for malignant neoplasm of breast: Secondary | ICD-10-CM | POA: Diagnosis not present

## 2018-01-29 ENCOUNTER — Other Ambulatory Visit: Payer: Self-pay

## 2018-03-02 ENCOUNTER — Other Ambulatory Visit: Payer: Self-pay | Admitting: Family Medicine

## 2018-03-02 NOTE — Telephone Encounter (Signed)
Please advise 

## 2018-03-13 DIAGNOSIS — L309 Dermatitis, unspecified: Secondary | ICD-10-CM | POA: Diagnosis not present

## 2018-03-27 ENCOUNTER — Other Ambulatory Visit: Payer: Self-pay | Admitting: Family Medicine

## 2018-04-01 ENCOUNTER — Other Ambulatory Visit: Payer: Self-pay | Admitting: Family Medicine

## 2018-05-18 ENCOUNTER — Other Ambulatory Visit: Payer: Self-pay | Admitting: Family Medicine

## 2018-06-13 ENCOUNTER — Other Ambulatory Visit: Payer: Self-pay | Admitting: Family Medicine

## 2018-06-14 MED ORDER — ELETRIPTAN HYDROBROMIDE 40 MG PO TABS: ORAL_TABLET | ORAL | 0 refills | Status: DC

## 2018-06-16 ENCOUNTER — Other Ambulatory Visit: Payer: Self-pay | Admitting: Family Medicine

## 2018-09-10 DIAGNOSIS — Z23 Encounter for immunization: Secondary | ICD-10-CM | POA: Diagnosis not present

## 2018-09-19 ENCOUNTER — Other Ambulatory Visit: Payer: Self-pay | Admitting: Family Medicine

## 2018-09-21 ENCOUNTER — Encounter: Payer: Self-pay | Admitting: Family Medicine

## 2018-09-21 MED ORDER — FLUOXETINE HCL 20 MG PO CAPS: 20.0000 mg | ORAL_CAPSULE | Freq: Every day | ORAL | 0 refills | Status: DC

## 2018-10-02 ENCOUNTER — Ambulatory Visit (INDEPENDENT_AMBULATORY_CARE_PROVIDER_SITE_OTHER): Payer: 59 | Admitting: Family Medicine

## 2018-10-02 ENCOUNTER — Encounter: Payer: Self-pay | Admitting: Family Medicine

## 2018-10-02 VITALS — BP 104/60 | HR 65 | Temp 98.6°F | Ht 61.0 in | Wt 126.5 lb

## 2018-10-02 DIAGNOSIS — F411 Generalized anxiety disorder: Secondary | ICD-10-CM

## 2018-10-02 DIAGNOSIS — R739 Hyperglycemia, unspecified: Secondary | ICD-10-CM

## 2018-10-02 DIAGNOSIS — Z1331 Encounter for screening for depression: Secondary | ICD-10-CM | POA: Diagnosis not present

## 2018-10-02 DIAGNOSIS — G43809 Other migraine, not intractable, without status migrainosus: Secondary | ICD-10-CM | POA: Diagnosis not present

## 2018-10-02 DIAGNOSIS — Z0000 Encounter for general adult medical examination without abnormal findings: Secondary | ICD-10-CM | POA: Diagnosis not present

## 2018-10-02 MED ORDER — ELETRIPTAN HYDROBROMIDE 40 MG PO TABS
ORAL_TABLET | ORAL | 5 refills | Status: DC
Start: 2018-10-02 — End: 2019-08-12

## 2018-10-02 MED ORDER — ONDANSETRON HCL 4 MG PO TABS: 4.0000 mg | ORAL_TABLET | Freq: Three times a day (TID) | ORAL | 3 refills | Status: DC | PRN

## 2018-10-02 MED ORDER — FLUOXETINE HCL 20 MG PO CAPS: 20.0000 mg | ORAL_CAPSULE | Freq: Every day | ORAL | 1 refills | Status: DC

## 2018-10-02 NOTE — Progress Notes (Signed)
HPI:  Using dictation device. Unfortunately this device frequently misinterprets words/phrases.  Here for CPE: Due for mammo and labs  -Concerns and/or follow up today:  Chronic medical problems summarized below were reviewed for changes.  Doing well for the most part.  Trying to eat healthy.  Not as much exercise as she should get.  Has gained some weight.  Plans to get back on track with healthy lifestyle.  Her only complaint is some skin issues and her chronic migraines.  She saw a dermatologist about the skin, but it was not a helpful visit and steroids did not help.  She plans to get an appointment with a different dermatologist for reevaluation.  Could be sand fleas.  She gets itchy bumps on her legs, goes to the beach every other week and does see sand fleas in the sand. She has chronic migraines several per week to several per month.  She has tried several prophylactic medications that she did not tolerate including verapamil and Topamax.  Fortunately, Relpax works great for her.  Is a refill.  She also uses Zofran when she has nausea.  She needs a refill on this as well.  She has had migraines since she was a child.  They are unchanged.   Migraines: -her whole life -relpax prn and ibuprfen -has tried imitrex, maxalt, topamax, verapmil and amitryptyline and these do not work or have side effects -unchanged -requests refill on relpax  Reported hx OA/DDD: -back, knees, L hip -occ flares of pain in these areas -not getting any exercise recently  Hx anxiety: -prozac 105m daily -stable for a long time, also helpsthe headaches, would like to continue this medication -no hx depression or SI  Fibroids/pain: -post[place] the fibroid related pain per her report -seeing gyn  Hx borderline hyperglycemia: -borderline prediabetes -eating healthier and trying to exercise - no regular exercise now  -Diet: variety of foods,  balance and well rounded, larger portion sizes -Exercise: no regular exercise -Taking folic acid, vitamin D or calcium: no -Diabetes and Dyslipidemia Screening: due for labs -Vaccines: see vaccine section EPIC -pap history: [place]yn[place]-FDLMP: see nursing notes -sexual activity: yes, female partner, no new partners -wants STI testing (Hep C if born [birth year]: no -FH breast, colon or ovarian ca: see FH Last mammogram: in the past reported doing with gyn Last colon cancer screening: did cologuard 12/2016 Breast Ca Risk Assessment: see family history and pt history DEXA (>/= 65): n/a  -Alcohol, Tobacco, drug use: see social history  Review of Systems - no fevers, unintentional weight loss, vision loss, hearing loss, chest pain, sob, hemoptysis, melena, hematochezia, hematuria, genital discharge, changing or concerning skin lesions, bleeding, bruising, loc, thoughts of self harm or SI  Past Medical History:  Diagnosis Date  . Anemia   . Anxiety   . Depression    -she denies hx depression, but she had stress during mother's illness  . Migraine   . RAYNAUDS SYNDROME 01/06/2011   Qualifier: Diagnosis of  By: JArnoldo MoraleMD, JBalinda Quails  . Uterine fibroid    diagnosed by OB/GYN Dr LMayra Neer . Varicose veins of left lower extremity with both ulcer of ankle and inflammation ([place] 07/07/2011   Referral to interventional radiology for vascular study and potential intervention     Past Surgical History:  Procedure Laterality Date  . CESAREAN SECTION    . HYSTEROSCOPY    . TONSILLECTOMY    .  TONSILLECTOMY    . TUBAL LIGATION      Family History  Problem Relation Age of Onset  . COPD Mother   . Hyperlipidemia Mother   . Diabetes Mother   . Cancer Father        lung, smoker  . Diabetes Father   . Stroke Father   . Colon cancer Maternal Grandmother     Social History   Socioeconomic History  . Marital status: Married    Spouse name: Not on file  . Number of  children: Not on file  . Years of education: Not on file  . Highest education level: Not on file  Occupational History  . Occupation: [employer]    Employer: [employer]  . Occupation: Occupational psychologist  Social Needs  . Financial resource strain: Not on file  . Food insecurity:    Worry: Not on file    Inability: Not on file  . Transportation needs:    Medical: Not on file    Non-medical: Not on file  Tobacco Use  . Smoking status: Never Smoker  . Smokeless tobacco: Never Used  Substance and Sexual Activity  . Alcohol use: No  . Drug use: No  . Sexual activity: Not on file  Lifestyle  . Physical activity:    Days per week: Not on file    Minutes per session: Not on file  . Stress: Not on file  Relationships  . Social connections:    Talks on phone: Not on file    Gets together: Not on file    Attends religious service: Not on file    Active member of club or organization: Not on file    Attends meetings of clubs or organizations: Not on file    Relationship status: Not on file  Other Topics Concern  . Not on file  Social History Narrative   Work or School: acounting       Home Situation: lives with husband and son 54 yo, and niece whom is 63 yo      Spiritual Beliefs: Christian      Lifestyle:trying to eat healthy; no regular exercise     Current Outpatient Medications:  .  cyclobenzaprine (FLEXERIL) 10 MG tablet, , Disp: , Rfl:  .  eletriptan (RELPAX) 40 MG tablet, TAKE 1 TABLET BY MOUTH AT ONSET OF HEADACHE MAY REPEAT IN 2 HOURS IF PERSISTS OR REOCCURS, Disp: 9 tablet, Rfl: 5 .  eletriptan (RELPAX) 40 MG tablet, May repeat in 2 hours if headache persists or recurs., Disp: 9 tablet, Rfl: 0 .  FLUoxetine (PROZAC) 20 MG capsule, Take 1 capsule (20 mg total) by mouth daily., Disp: 30 capsule, Rfl: 0 .  fluticasone (FLONASE) 50 MCG/ACT nasal spray, SPRAY 2 SPRAYS INTO EACH NOSTRIL EVERY DAY, Disp: 16 g, Rfl: 0 .  norethindrone ([name]) 0.35 MG tablet, Take 1 tablet  by mouth daily., Disp: , Rfl:  .  ondansetron (ZOFRAN) 8 MG tablet, Take by mouth as needed for nausea or vomiting (migraines)., Disp: , Rfl:  .  triamcinolone cream (KENALOG) 0.1 %, Apply 1 application topically 2 (two) times daily., Disp: 30 g, Rfl: 0  EXAM:  Vitals:   10/02/18 1635  BP: 104/60  Pulse: 65  Temp: 98.6 F (37 C)    GENERAL: vitals reviewed and listed below, alert, oriented, appears well hydrated and in no acute distress  HEENT: head atraumatic, PERRLA, normal appearance of eyes, ears, nose and mouth. moist mucus membranes.  NECK: supple, no  masses or lymphadenopathy  LUNGS: clear to auscultation bilaterally, no rales, rhonchi or wheeze  CV: HRRR, no peripheral edema or cyanosis, normal pedal pulses  ABDOMEN: bowel sounds normal, soft, non tender to palpation, no masses, no rebound or guarding  GU/BREAST: sees gyn, declined  SKIN: few excoriated papules on the ankles, feet  MS: normal gait, moves all extremities normally  NEURO: normal gait, speech and thought processing grossly intact, muscle tone grossly intact throughout  PSYCH: normal affect, pleasant and cooperative  ASSESSMENT AND PLAN:  Discussed the following assessment and plan:  PREVENTIVE EXAM: -Discussed and advised all [place] preventive services health task force level A and B recommendations for age, sex and risks. -Advised at least 150 minutes of exercise per week and a healthy diet with avoidance of (less then 1 serving per week) processed foods, white starches, red meat, fast foods and sweets and consisting of: * 5-9 servings of fresh fruits and vegetables (not corn or potatoes) *nuts and seeds, beans *olives and olive oil *lean meats such as fish and white chicken  *whole grains -labs, studies and vaccines per orders this encounter - Plan: HDL cholesterol, Cholesterol, total  Other migraine without status migrainosus, not intractable -using prn med as has tried a number of prophylactic  medications -advised her of new migraine medication options, she may consider and see neurology if decides to try -refilled medications  Screening for depression - see phq9  Hyperglycemia - Plan: Hemoglobin A1c  GAD (generalized anxiety disorder) - stable, cont current treatment    Patient advised to return to clinic immediately if symptoms worsen or persist or new concerns.  Patient Instructions  BEFORE YOU LEAVE: -labs -follow up: 6 months  See dermatology about the skin.  Let me know if you want to see the neurologist about the Migraines.  We have ordered labs or studies at this visit. It can take up to 1-2 weeks for results and processing. IF results require follow up or explanation, we will call you with instructions. Clinically stable results will be released to your [facility]. If you have not heard from [place] or cannot find your results in [facility] in 2 weeks please contact our office at [phone].  If you are not yet signed up for [facility], please consider signing up.   We recommend the following healthy lifestyle for LIFE: 1) Small portions. But, make sure to get regular (at least 3 per day), healthy meals and small healthy snacks if needed.  2) Eat a healthy clean diet.   TRY TO EAT: -at least 5-7 servings of low sugar, colorful, and nutrient rich vegetables per day (not corn, potatoes or bananas.) -berries are the best choice if you wish to eat fruit (only eat small amounts if trying to reduce weight)  -lean meets (fish, white meat of chicken or [place]) -vegan proteins for some meals - beans or tofu, whole grains, nuts and seeds -Replace bad fats with good fats - good fats include: fish, nuts and seeds, canola oil, olive oil -small amounts of low fat or non fat dairy -small amounts of100 % whole grains - check the lables -drink plenty of water  AVOID: -SUGAR, sweets, anything with added sugar, corn syrup or sweeteners - must read labels as even foods advertised as  "healthy" often are loaded with sugar -if you must have a sweetener, small amounts of stevia may be best -sweetened beverages and artificially sweetened beverages -simple starches (rice, bread, potatoes, pasta, chips, etc - small amounts of 100% whole  grains are ok) -red meat, pork, butter -fried foods, fast food, processed food, excessive dairy, eggs and coconut.  3)Get at least 150 minutes of sweaty aerobic exercise per week.  4)Reduce stress - consider counseling, meditation and relaxation to balance other aspects of your life.   Preventive Care 40-64 Years, Female Preventive care refers to lifestyle choices and visits with your health care provider that can promote health and wellness. What does preventive care include?  A yearly physical exam. This is also called an annual well check.  Dental exams once or twice a year.  Routine eye exams. Ask your health care provider how often you should have your eyes checked.  Personal lifestyle choices, including: ? Daily care of your teeth and gums. ? Regular physical activity. ? Eating a healthy diet. ? Avoiding tobacco and drug use. ? Limiting alcohol use. ? Practicing safe sex. ? Taking vitamin and mineral supplements as recommended by your health care provider. What happens during an annual well check? The services and screenings done by your health care provider during your annual well check will depend on your age, overall health, lifestyle risk factors, and family history of disease. Counseling Your health care provider may ask you questions about your:  Alcohol use.  Tobacco use.  Drug use.  Emotional well-being.  Home and relationship well-being.  Sexual activity.  Eating habits.  Work and work Statistician.  Method of birth control.  Menstrual cycle.  Pregnancy history.  Screening You may have the following tests or measurements:  Height, weight, and BMI.  Blood pressure.  Lipid and cholesterol  levels. These may be checked every 5 years, or more frequently if you are over 57 years old.  Skin check.  Lung cancer screening. You may have this screening every year starting at age 67 if you have a 30-pack-year history of smoking and currently smoke or have quit within the past 15 years.  Fecal occult blood test (FOBT) of the stool. You may have this test every year starting at age 40.  Flexible sigmoidoscopy or colonoscopy. You may have a sigmoidoscopy every 5 years or a colonoscopy every 10 years starting at age 15.  Hepatitis C blood test.  Hepatitis B blood test.  Sexually transmitted disease (STD) testing.  Diabetes screening. This is done by checking your blood sugar (glucose) after you have not eaten for a while (fasting). You may have this done every 1-3 years.  Mammogram. This may be done every 1-2 years. Talk to your health care provider about when you should start having regular mammograms. This may depend on whether you have a family history of breast cancer.  BRCA-related cancer screening. This may be done if you have a family history of breast, ovarian, tubal, or peritoneal cancers.  Pelvic exam and Pap test. This may be done every 3 years starting at age 21. Starting at age 46, this may be done every 5 years if you have a Pap test in combination with an HPV test.  Bone density scan. This is done to screen for osteoporosis. You may have this scan if you are at high risk for osteoporosis.  Discuss your test results, treatment options, and if necessary, the need for more tests with your health care provider. Vaccines Your health care provider may recommend certain vaccines, such as:  Influenza vaccine. This is recommended every year.  Tetanus, diphtheria, and acellular pertussis (Tdap, Td) vaccine. You may need a Td booster every 10 years.  Varicella vaccine. You may  need this if you have not been vaccinated.  Zoster vaccine. You may need this after age  38.  Measles, mumps, and rubella (MMR) vaccine. You may need at least one dose of MMR if you were born in 1957 or later. You may also need a second dose.  Pneumococcal 13-valent conjugate (PCV13) vaccine. You may need this if you have certain conditions and were not previously vaccinated.  Pneumococcal polysaccharide (PPSV23) vaccine. You may need one or two doses if you smoke cigarettes or if you have certain conditions.  Meningococcal vaccine. You may need this if you have certain conditions.  Hepatitis A vaccine. You may need this if you have certain conditions or if you travel or work in places where you may be exposed to hepatitis A.  Hepatitis B vaccine. You may need this if you have certain conditions or if you travel or work in places where you may be exposed to hepatitis B.  Haemophilus influenzae type b (Hib) vaccine. You may need this if you have certain conditions.  Talk to your health care provider about which screenings and vaccines you need and how often you need them. This information is not intended to replace advice given to you by your health care provider. Make sure you discuss any questions you have with your health care provider. Document Released: 01/01/2016 Document Revised: 08/24/2016 Document Reviewed: 10/06/2015 Elsevier Interactive Patient Education  2018 [employer].           No follow-ups on file.  Crystal Kern, DO

## 2018-10-02 NOTE — Patient Instructions (Signed)
BEFORE YOU LEAVE: -labs -follow up: 6 months  See dermatology about the skin.  Let me know if you want to see the neurologist about the Migraines.  We have ordered labs or studies at this visit. It can take up to 1-2 weeks for results and processing. IF results require follow up or explanation, we will call you with instructions. Clinically stable results will be released to your [facility]. If you have not heard from [place] or cannot find your results in [facility] in 2 weeks please contact our office at [phone].  If you are not yet signed up for [facility], please consider signing up.   We recommend the following healthy lifestyle for LIFE: 1) Small portions. But, make sure to get regular (at least 3 per day), healthy meals and small healthy snacks if needed.  2) Eat a healthy clean diet.   TRY TO EAT: -at least 5-7 servings of low sugar, colorful, and nutrient rich vegetables per day (not corn, potatoes or bananas.) -berries are the best choice if you wish to eat fruit (only eat small amounts if trying to reduce weight)  -lean meets (fish, white meat of chicken or [place]) -vegan proteins for some meals - beans or tofu, whole grains, nuts and seeds -Replace bad fats with good fats - good fats include: fish, nuts and seeds, canola oil, olive oil -small amounts of low fat or non fat dairy -small amounts of100 % whole grains - check the lables -drink plenty of water  AVOID: -SUGAR, sweets, anything with added sugar, corn syrup or sweeteners - must read labels as even foods advertised as "healthy" often are loaded with sugar -if you must have a sweetener, small amounts of stevia may be best -sweetened beverages and artificially sweetened beverages -simple starches (rice, bread, potatoes, pasta, chips, etc - small amounts of 100% whole grains are ok) -red meat, pork, butter -fried foods, fast food, processed food, excessive dairy, eggs and coconut.  3)Get at least 150 minutes of sweaty  aerobic exercise per week.  4)Reduce stress - consider counseling, meditation and relaxation to balance other aspects of your life.   Preventive Care 40-64 Years, Female Preventive care refers to lifestyle choices and visits with your health care provider that can promote health and wellness. What does preventive care include?  A yearly physical exam. This is also called an annual well check.  Dental exams once or twice a year.  Routine eye exams. Ask your health care provider how often you should have your eyes checked.  Personal lifestyle choices, including: ? Daily care of your teeth and gums. ? Regular physical activity. ? Eating a healthy diet. ? Avoiding tobacco and drug use. ? Limiting alcohol use. ? Practicing safe sex. ? Taking vitamin and mineral supplements as recommended by your health care provider. What happens during an annual well check? The services and screenings done by your health care provider during your annual well check will depend on your age, overall health, lifestyle risk factors, and family history of disease. Counseling Your health care provider may ask you questions about your:  Alcohol use.  Tobacco use.  Drug use.  Emotional well-being.  Home and relationship well-being.  Sexual activity.  Eating habits.  Work and work Statistician.  Method of birth control.  Menstrual cycle.  Pregnancy history.  Screening You may have the following tests or measurements:  Height, weight, and BMI.  Blood pressure.  Lipid and cholesterol levels. These may be checked every 5 years, or more  frequently if you are over 66 years old.  Skin check.  Lung cancer screening. You may have this screening every year starting at age 21 if you have a 30-pack-year history of smoking and currently smoke or have quit within the past 15 years.  Fecal occult blood test (FOBT) of the stool. You may have this test every year starting at age 25.  Flexible  sigmoidoscopy or colonoscopy. You may have a sigmoidoscopy every 5 years or a colonoscopy every 10 years starting at age 32.  Hepatitis C blood test.  Hepatitis B blood test.  Sexually transmitted disease (STD) testing.  Diabetes screening. This is done by checking your blood sugar (glucose) after you have not eaten for a while (fasting). You may have this done every 1-3 years.  Mammogram. This may be done every 1-2 years. Talk to your health care provider about when you should start having regular mammograms. This may depend on whether you have a family history of breast cancer.  BRCA-related cancer screening. This may be done if you have a family history of breast, ovarian, tubal, or peritoneal cancers.  Pelvic exam and Pap test. This may be done every 3 years starting at age 100. Starting at age 36, this may be done every 5 years if you have a Pap test in combination with an HPV test.  Bone density scan. This is done to screen for osteoporosis. You may have this scan if you are at high risk for osteoporosis.  Discuss your test results, treatment options, and if necessary, the need for more tests with your health care provider. Vaccines Your health care provider may recommend certain vaccines, such as:  Influenza vaccine. This is recommended every year.  Tetanus, diphtheria, and acellular pertussis (Tdap, Td) vaccine. You may need a Td booster every 10 years.  Varicella vaccine. You may need this if you have not been vaccinated.  Zoster vaccine. You may need this after age 32.  Measles, mumps, and rubella (MMR) vaccine. You may need at least one dose of MMR if you were born in 1957 or later. You may also need a second dose.  Pneumococcal 13-valent conjugate (PCV13) vaccine. You may need this if you have certain conditions and were not previously vaccinated.  Pneumococcal polysaccharide (PPSV23) vaccine. You may need one or two doses if you smoke cigarettes or if you have certain  conditions.  Meningococcal vaccine. You may need this if you have certain conditions.  Hepatitis A vaccine. You may need this if you have certain conditions or if you travel or work in places where you may be exposed to hepatitis A.  Hepatitis B vaccine. You may need this if you have certain conditions or if you travel or work in places where you may be exposed to hepatitis B.  Haemophilus influenzae type b (Hib) vaccine. You may need this if you have certain conditions.  Talk to your health care provider about which screenings and vaccines you need and how often you need them. This information is not intended to replace advice given to you by your health care provider. Make sure you discuss any questions you have with your health care provider. Document Released: 01/01/2016 Document Revised: 08/24/2016 Document Reviewed: 10/06/2015 Elsevier Interactive Patient Education  [employer].

## 2018-10-03 LAB — CHOLESTEROL, TOTAL: CHOLESTEROL: 137 mg/dL (ref 0–200)

## 2018-10-03 LAB — HEMOGLOBIN A1C: Hgb A1c MFr Bld: 5.8 % (ref 4.6–6.5)

## 2018-10-03 LAB — HDL CHOLESTEROL: HDL: 50.1 mg/dL (ref >39.00)

## 2018-10-29 ENCOUNTER — Encounter: Payer: Self-pay | Admitting: Family Medicine

## 2018-10-29 NOTE — Telephone Encounter (Signed)
Please let her know about the [place] [facility].  They are separate from our practice, but they do offer allergy testing.

## 2018-11-07 ENCOUNTER — Encounter: Payer: Self-pay | Admitting: Allergy

## 2018-11-07 ENCOUNTER — Ambulatory Visit (INDEPENDENT_AMBULATORY_CARE_PROVIDER_SITE_OTHER): Payer: 59 | Admitting: Allergy

## 2018-11-07 VITALS — BP 110/70 | HR 73 | Temp 98.1°F | Resp 16 | Ht 60.5 in | Wt 126.8 lb

## 2018-11-07 DIAGNOSIS — L508 Other urticaria: Secondary | ICD-10-CM

## 2018-11-07 DIAGNOSIS — T783XXD Angioneurotic edema, subsequent encounter: Secondary | ICD-10-CM | POA: Diagnosis not present

## 2018-11-07 MED ORDER — MONTELUKAST SODIUM 10 MG PO TABS: 10.0000 mg | ORAL_TABLET | Freq: Every day | ORAL | 3 refills | Status: DC

## 2018-11-07 MED ORDER — FAMOTIDINE 20 MG PO TABS: 20.0000 mg | ORAL_TABLET | Freq: Two times a day (BID) | ORAL | 3 refills | Status: DC

## 2018-11-07 NOTE — Progress Notes (Signed)
New Patient Note  RE: Crystal Dillon MRN: [id number] DOB: May 17, 1966 Date of Office Visit: 11/07/2018  Referring provider: Lucretia Kern, DO Primary care provider: Lucretia Kern, DO  Chief Complaint: rash  History of present illness: Crystal Dillon is a 52 y.o. female presenting today for consultation for rash.  She states she has had her current rash off and on for years but it has gotten worse over the past year.   The rash starts out as little dots.  Rash is very itchy.  She then scratches and breaks the skin and she scars down.  The rash does leave marks behind due to the scratching.  She feels she may have some swelling around her ankles.  She does report joint aches/pains with this rash.  She denies any history of arthritis.  She states her hip and elbows mainly are involved.  She has tried triamcinolone cream but does not help.  She also has tried hydroxyzine but states it makes her extremely sleepy and in a grog the following day.    She states she has used benadryl occasionally but also makes her groggy.  She does not report any exacerbating factors.  No preceding illnesses, no new medications, no change in foods or foods that make itchier/rash worse, no change in body products/soaps/detergents.   No household members with similar symptoms.  She denies any fevers.  She does state she has had about a 20lb weight gain.  She denies any travel prior to onset of symptoms. She has seen dermatology about 8 months ago for this rash who recommended the triamcinolone but states her rash has not gotten any better.  She states they were not able to biopsy due to the degree of her scratching.    She does use flonase seasonally with nasal congestion/drainage.   No history of asthma, eczema or food allergy.    Review of systems: Review of Systems  Constitutional: Negative for chills, fever, malaise/fatigue and weight loss.  HENT: Negative for congestion, ear discharge, nosebleeds, sinus pain and  sore throat.   Eyes: Negative for pain, discharge and redness.  Respiratory: Negative for cough, shortness of breath and wheezing.   Cardiovascular: Negative for chest pain.  Gastrointestinal: Negative for abdominal pain, constipation, diarrhea, heartburn, nausea and vomiting.  Musculoskeletal: Positive for joint pain.  Skin: Positive for itching and rash.  Neurological: Negative for headaches.    All other systems negative unless noted above in HPI  Past medical history: Past Medical History:  Diagnosis Date  . Anemia   . Anxiety   . Depression    -she denies hx depression, but she had stress during mother's illness  . Migraine   . RAYNAUDS SYNDROME 01/06/2011   Qualifier: Diagnosis of  By: Arnoldo Morale MD, Balinda Quails   . Uterine fibroid    diagnosed by OB/GYN Dr Mayra Neer  . Varicose veins of left lower extremity with both ulcer of ankle and inflammation ([place]) 07/07/2011   Referral to interventional radiology for vascular study and potential intervention     Past surgical history: Past Surgical History:  Procedure Laterality Date  . ADENOIDECTOMY    . CESAREAN SECTION    . HYSTEROSCOPY    . TONSILLECTOMY    . TONSILLECTOMY    . TUBAL LIGATION      Family history:  Family History  Problem Relation Age of Onset  . COPD Mother   . Hyperlipidemia Mother   . Diabetes Mother   . Cancer Father  lung, smoker  . Diabetes Father   . Stroke Father   . Colon cancer Maternal Grandmother     Social history: Lives in a home with family with carpeting with gas heating and central cooling.  4 dogs in the home.  No concern for water damage, mildew or roaches in the home.  She works as a Holiday representative.  Denies a smoking history.   Medication List: Allergies as of 11/07/2018   No Known Allergies     Medication List        Accurate as of 11/07/18  2:58 PM. Always use your most recent med list.          [name] 0.35 MG tablet Generic drug:   norethindrone Take 1 tablet by mouth daily.   cyclobenzaprine 10 MG tablet Commonly known as:  FLEXERIL   eletriptan 40 MG tablet Commonly known as:  RELPAX TAKE 1 TABLET BY MOUTH AT ONSET OF HEADACHE MAY REPEAT IN 2 HOURS IF PERSISTS OR REOCCURS   FLUoxetine 20 MG capsule Commonly known as:  PROZAC Take 1 capsule (20 mg total) by mouth daily.   fluticasone 50 MCG/ACT nasal spray Commonly known as:  FLONASE SPRAY 2 SPRAYS INTO EACH NOSTRIL EVERY DAY   ondansetron 4 MG tablet Commonly known as:  ZOFRAN Take 1 tablet (4 mg total) by mouth every 8 (eight) hours as needed for nausea or vomiting (migraines).   triamcinolone cream 0.1 % Commonly known as:  KENALOG Apply 1 application topically 2 (two) times daily.       Known medication allergies: No Known Allergies   Physical examination: Blood pressure 110/70, pulse 73, temperature 98.1 F (36.7 C), temperature source Oral, resp. rate 16, height 5' 0.5" (1.537 m), weight 126 lb 12.8 oz (57.5 kg), last menstrual period 07/06/2011, SpO2 98 %.  General: Alert, interactive, in no acute distress. HEENT: PERRLA, TMs pearly gray, turbinates non-edematous without discharge, post-pharynx non erythematous. Neck: Supple without lymphadenopathy. Lungs: Clear to auscultation without wheezing, rhonchi or rales. {no increased work of breathing. CV: Normal S1, S2 without murmurs. Abdomen: Nondistended, nontender. Skin: Scattered erythematous urticarial type lesions primarily located shoulders, forearms and legs b/l , nonvesicular. Extremities:  No clubbing, cyanosis or edema. Neuro:   Grossly intact.  Diagnositics/Labs: Allergy testing: deferred due to ongoing rash   Assessment and plan:   Urticaria with angioedema, chronic  -  At this time etiology of hives and swelling is unknown.  Hives can be caused by a variety of different triggers including illness/infection, foods, medications, stings, exercise, pressure, vibrations,  extremes of temperature to name a few however majority of the time there is no identifiable trigger.  Your symptoms have been ongoing for >6 weeks making this chronic thus will obtain labwork to evaluate: CBC w diff, CMP, tryptase, hive panel, environmental panel, alpha-gal panel, inflammatory markers, ANA   - start the following medication regimen: Allegra 180mg  1 tablet twice a day, Pepcid 20mg  1 tablet twice a day and Singulair 10mg  daily   - we also discussed Xolair monthly injections if above medication regimen is not effective enough for you    - keep skin moisturized to help decrease itch  Follow-up 2-3 months or sooner if needed   I appreciate the opportunity to take part in Brayley's care. Please do not hesitate to contact me with questions.  Sincerely,   Prudy Feeler, MD Allergy/Immunology [facility]

## 2018-11-07 NOTE — Patient Instructions (Addendum)
Dermatitis   - appearance and description of the rash does closely resemble urticaria (hives) and you have had some swelling as well which can be seen with urticaria.  Urticaria can be extremely itchy.   At this time etiology of hives and swelling is unknown.  Hives can be caused by a variety of different triggers including illness/infection, foods, medications, stings, exercise, pressure, vibrations, extremes of temperature to name a few however majority of the time there is no identifiable trigger.  Your symptoms have been ongoing for >6 weeks making this chronic thus will obtain labwork to evaluate: CBC w diff, CMP, tryptase, hive panel, environmental panel, alpha-gal panel, inflammatory markers, ANA   - start the following medication regimen: Allegra 180mg  1 tablet twice a day, Pepcid 20mg  1 tablet twice a day and Singulair 10mg  daily   - we also discussed Xolair monthly injections if above medication regimen is not effective enough for you    - keep skin moisturized to help decrease itch  Follow-up 2-3 months or sooner if needed

## 2018-11-14 ENCOUNTER — Telehealth: Payer: Self-pay | Admitting: *Deleted

## 2018-11-14 LAB — IGE+ALLERGENS ZONE 2(30)
Alternaria Alternata IgE: <0.1 kU/L
Aspergillus Fumigatus IgE: <0.1 kU/L
Bahia Grass IgE: <0.1 kU/L
Cat Dander IgE: <0.1 kU/L
Cedar, Mountain IgE: <0.1 kU/L
Cladosporium Herbarum IgE: <0.1 kU/L
Cockroach, American IgE: <0.1 kU/L
Common Silver Birch IgE: <0.1 kU/L
Dog Dander IgE: <0.1 kU/L
Elm, American IgE: <0.1 kU/L
Hickory, White IgE: <0.1 kU/L
IGE (IMMUNOGLOBULIN E), SERUM: 16 [IU]/mL (ref 6–495)
Maple/Box Elder IgE: <0.1 kU/L
Mucor Racemosus IgE: <0.1 kU/L
Nettle IgE: <0.1 kU/L
Penicillium Chrysogen IgE: <0.1 kU/L
Pigweed, Rough IgE: <0.1 kU/L
Sheep Sorrel IgE Qn: <0.1 kU/L
Sweet gum IgE RAST Ql: <0.1 kU/L
Timothy Grass IgE: <0.1 kU/L
White Mulberry IgE: <0.1 kU/L

## 2018-11-14 LAB — ALPHA-GAL PANEL
Class Interpretation: 0
Class Interpretation: 0
LAMB CLASS INTERPRETATION: 0
Lamb/Mutton (Ovis spp) IgE: <0.1 kU/L (ref <0.35)
Pork (Sus spp) IgE: <0.1 kU/L (ref <0.35)

## 2018-11-14 LAB — COMPREHENSIVE METABOLIC PANEL
ALK PHOS: 173 IU/L — AB (ref 39–117)
ALT: 18 IU/L (ref 0–32)
AST: 17 IU/L (ref 0–40)
Albumin/Globulin Ratio: 2 (ref 1.2–2.2)
Albumin: 4.9 g/dL (ref 3.5–5.5)
BILIRUBIN TOTAL: 0.3 mg/dL (ref 0.0–1.2)
BUN/Creatinine Ratio: 20 (ref 9–23)
BUN: 14 mg/dL (ref 6–24)
CHLORIDE: 101 mmol/L (ref 96–106)
CO2: 22 mmol/L (ref 20–29)
Calcium: 9.7 mg/dL (ref 8.7–10.2)
Creatinine, Ser: 0.71 mg/dL (ref 0.57–1.00)
GFR calc Af Amer: 112 mL/min/{1.73_m2} (ref >59)
GFR calc non Af Amer: 97 mL/min/{1.73_m2} (ref >59)
Globulin, Total: 2.5 g/dL (ref 1.5–4.5)
Glucose: 114 mg/dL — ABNORMAL HIGH (ref 65–99)
Potassium: 4.4 mmol/L (ref 3.5–5.2)
Sodium: 138 mmol/L (ref 134–144)
TOTAL PROTEIN: 7.4 g/dL (ref 6.0–8.5)

## 2018-11-14 LAB — CBC WITH DIFFERENTIAL
Basophils Absolute: 0.1 10*3/uL (ref 0.0–0.2)
Basos: 1 %
EOS (ABSOLUTE): 0.1 10*3/uL (ref 0.0–0.4)
EOS: 1 %
HEMATOCRIT: 38.8 % (ref 34.0–46.6)
Hemoglobin: 12.8 g/dL (ref 11.1–15.9)
IMMATURE GRANS (ABS): 0 10*3/uL (ref 0.0–0.1)
Immature Granulocytes: 0 %
Lymphocytes Absolute: 1.9 10*3/uL (ref 0.7–3.1)
Lymphs: 39 %
MCH: 29.4 pg (ref 26.6–33.0)
MCHC: 33 g/dL (ref 31.5–35.7)
MCV: 89 fL (ref 79–97)
Monocytes Absolute: 0.4 10*3/uL (ref 0.1–0.9)
Monocytes: 7 %
Neutrophils Absolute: 2.6 10*3/uL (ref 1.4–7.0)
Neutrophils: 52 %
RBC: 4.36 x10E6/uL (ref 3.77–5.28)
RDW: 13 % (ref 12.3–15.4)
WBC: 5 10*3/uL (ref 3.4–10.8)

## 2018-11-14 LAB — ANA: Anti Nuclear Antibody(ANA): POSITIVE — AB

## 2018-11-14 LAB — THYROID ANTIBODIES: Thyroperoxidase Ab SerPl-aCnc: <6 IU/mL (ref 0–34)

## 2018-11-14 LAB — TRYPTASE: Tryptase: 2.8 ug/L (ref 2.2–13.2)

## 2018-11-14 LAB — CHRONIC URTICARIA: CU INDEX: 31.8 — AB (ref <10)

## 2018-11-14 LAB — SEDIMENTATION RATE: SED RATE: 15 mm/h (ref 0–40)

## 2018-11-14 NOTE — Telephone Encounter (Signed)
Patient called with questions regarding her mychart lab results. Please advise

## 2018-11-14 NOTE — Telephone Encounter (Signed)
Patient notified of labs.   

## 2018-11-19 ENCOUNTER — Telehealth: Payer: Self-pay

## 2018-11-19 NOTE — Telephone Encounter (Signed)
Patient is calling to ask who does she need to follow up with about her Alkaline Phosphatase levels being elevated. I have faxed [facility] that patients referral. Patient was given their information and will follow up in a few days.

## 2018-11-19 NOTE — Telephone Encounter (Signed)
-----   Message from [place], MD sent at 11/14/2018 10:48 AM EST ----- Please put in referral for Rheumatology for "pt with autoimmune urticaria associated with joint pain of large joints with positive ANA"  Thanks. ----- Message ----- From: Valere Dross, CMA Sent: 11/14/2018  10:06 AM EST To: Kennith Gain, MD  Pt notified of results, expressed understanding. She will f/u with PCP regarding her Glucose and Liver labs elevated. Patient would like a referral to Rheumatology?

## 2018-11-19 NOTE — Telephone Encounter (Signed)
Patient called back and would like to be seen at Dr Estanislado Pandy office. I have placed the referral in there work que instead. Patient is going to call [facility] and cx the appointment they made for 11-26-2018 to see the PA.

## 2018-11-20 NOTE — Telephone Encounter (Signed)
Spoke to pt and explained to her we can redraw her lab for alkaline phosphate at her next visit

## 2018-11-20 NOTE — Progress Notes (Signed)
Office Visit Note  Patient: Crystal Dillon             Date of Birth: Jun 26, 1966           MRN: [id number]             PCP: Lucretia Kern, DO Referring: Lucretia Kern, DO Visit Date: 11/23/2018 Occupation: Credit analyst  Subjective:  Pain in multiple joints.   History of Present Illness: Crystal Dillon is a 52 y.o. female seen in consultation per request of her PCP.  According to patient she has had history of lower back pain for at least 4 years and she was diagnosed with disc disease of lumbar spine.  She states over the last 2 years she has been experiencing pain and discomfort in multiple joints.  She states the joint pain has been progressively getting worse.  She describes pain in her cervical spine, both elbows, both wrist joints, both hands.  She has been also experiencing pain in her both hips left more than right, left knee, bilateral ankles and her feet.  She states she has noticed swelling in her both wrist joints, both hands and her ankles.  She also suffers from raynauds phenomenon for the last 3 to 4 years.  She has noticed discoloration in her hands and feet.  She has suffered from a rash for the last 2 years which is progressively getting worse.  She states she was seen by dermatologist who diagnosed it was urticaria.  She never had skin biopsy.  She saw an allergist about 2 weeks ago and was placed on Pepcid, Allegra and Singulair.  She has noticed improvement in the itching but not the rash.  Activities of Daily Living:  Patient reports morning stiffness for 1 hour.   Patient Reports nocturnal pain.  Difficulty dressing/grooming: Denies Difficulty climbing stairs: Denies Difficulty getting out of chair: Denies Difficulty using hands for taps, buttons, cutlery, and/or writing: Denies  Review of Systems  Constitutional: Positive for fatigue, night sweats and weight gain. Negative for weight loss.       18 pounds weight gain over the last 18 months.  HENT: Positive for  mouth dryness. Negative for mouth sores, trouble swallowing, trouble swallowing and nose dryness.   Eyes: Positive for dryness. Negative for pain, redness and visual disturbance.  Respiratory: Negative for cough, shortness of breath and difficulty breathing.   Cardiovascular: Negative for chest pain, palpitations, hypertension, irregular heartbeat and swelling in legs/feet.  Gastrointestinal: Positive for constipation. Negative for blood in stool and diarrhea.  Endocrine: Negative for increased urination.  Genitourinary: Negative for vaginal dryness.  Musculoskeletal: Positive for arthralgias, joint pain, joint swelling and morning stiffness. Negative for myalgias, muscle weakness, muscle tenderness and myalgias.  Skin: Positive for color change, rash and sensitivity to sunlight. Negative for hair loss, skin tightness and ulcers.  Allergic/Immunologic: Negative for susceptible to infections.  Neurological: Positive for night sweats. Negative for dizziness, memory loss and weakness.  Hematological: Negative for swollen glands.  Psychiatric/Behavioral: Positive for sleep disturbance. Negative for depressed mood. The patient is nervous/anxious.     PMFS History:  Patient Active Problem List   Diagnosis Date Noted  . Hx of migraines 11/23/2018  . Raynaud's syndrome without gangrene 11/23/2018  . Chronic urticaria 11/23/2018  . Family history of inflammatory bowel disease 11/23/2018  . Upper respiratory infection 02/28/2017  . Fibroids 09/20/2016  . Anxiety state 09/20/2016  . DDD (degenerative disc disease), lumbar 09/20/2016  . Migraine 09/20/2016  .  Left hip pain 09/20/2016  . Chronic pain of both knees 09/20/2016  . Hyperglycemia 09/20/2016    Past Medical History:  Diagnosis Date  . Anemia   . Anxiety   . Chronic urticaria 11/23/2018  . Depression    -she denies hx depression, but she had stress during mother's illness  . Migraine   . RAYNAUDS SYNDROME 01/06/2011   Qualifier:  Diagnosis of  By: Arnoldo Morale MD, Balinda Quails   . Uterine fibroid    diagnosed by OB/GYN Dr Mayra Neer  . Varicose veins of left lower extremity with both ulcer of ankle and inflammation ([place]) 07/07/2011   Referral to interventional radiology for vascular study and potential intervention     Family History  Problem Relation Age of Onset  . COPD Mother   . Hyperlipidemia Mother   . Diabetes Mother   . Cancer Father        lung, smoker  . Diabetes Father   . Stroke Father   . Colon cancer Maternal Grandmother   . Healthy Sister   . Heart Problems Brother   . Crohn's disease Son   . Ulcerative colitis Daughter    Past Surgical History:  Procedure Laterality Date  . ADENOIDECTOMY    . CESAREAN SECTION    . HYSTEROSCOPY    . TONSILLECTOMY    . TONSILLECTOMY    . TUBAL LIGATION     Social History   Social History Narrative   Work or School: acounting       Home Situation: lives with husband and son 58 yo, and niece whom is 68 yo      Spiritual Beliefs: Christian      Lifestyle:trying to eat healthy; no regular exercise    Objective: Vital Signs: BP (!) 131/91 (BP Location: Right Arm, Patient Position: Sitting, Cuff Size: Normal)   Pulse 64   Resp 12   Ht '5\' 2"'$  (1.575 m)   Wt 123 lb (55.8 kg)   LMP 07/06/2011   BMI 22.50 kg/m    Physical Exam  Constitutional: She is oriented to person, place, and time. She appears well-developed and well-nourished.  HENT:  Head: Normocephalic and atraumatic.  Eyes: Conjunctivae and EOM are normal.  Neck: Normal range of motion.  Cardiovascular: Normal rate, regular rhythm, normal heart sounds and intact distal pulses.  Pulmonary/Chest: Effort normal and breath sounds normal.  Abdominal: Soft. Bowel sounds are normal.  Lymphadenopathy:    She has no cervical adenopathy.  Neurological: She is alert and oriented to person, place, and time.  Skin: Skin is warm and dry. Capillary refill takes less than 2 seconds.  Psychiatric: She has a  normal mood and affect. Her behavior is normal.  Nursing note and vitals reviewed.    Musculoskeletal Exam: C-spine thoracic lumbar spine good range of motion.  She has tenderness on palpation over left SI joint.  Shoulder joints elbow joints wrist joints MCPs with good range of motion.  She has DIP and PIP thickening without any synovitis.  She had tenderness across her PIPs.  She had painful range of motion of her left hip joint and left knee joint.  She has discomfort across MTPs of her right foot.  She also has tenderness over bilateral plantar fascia.  CDAI Exam: CDAI Score: Not documented Patient Global Assessment: Not documented; Provider Global Assessment: Not documented Swollen: Not documented; Tender: Not documented Joint Exam   Not documented   There is currently no information documented on the homunculus. Go to the  Rheumatology activity and complete the homunculus joint exam.  Investigation: No additional findings.  Imaging: Xr Hip Unilat W Or W/o Pelvis 2-3 Views Left  Result Date: 11/23/2018 No hip joint narrowing was noted.  No chondrocalcinosis was noted. Impression: Unremarkable x-ray of the hip joint.  Xr Foot 2 Views Right  Result Date: 11/23/2018 Minimal PIP and DIP narrowing was noted.  No MTP, intertarsal or subtalar joint space narrowing was noted.  No erosive changes were noted. Impression: Foot x-ray reveals mild osteoarthritic changes.  Xr Hand 2 View Left  Result Date: 11/23/2018 PIP and DIP narrowing was noted.  No MCP, intercarpal radiocarpal joint space narrowing was noted.  No erosive changes were noted.  A bone cyst was noted in the third metacarpal. Impression: These findings are consistent with mild osteoarthritis of the hand.  Xr Hand 2 View Right  Result Date: 11/23/2018 PIP and DIP narrowing was noted.  No MCP, intercarpal radiocarpal joint space narrowing was noted.  No erosive changes were noted. Impression: These findings are consistent with  mild osteoarthritis of the hand.  Xr Knee 3 View Left  Result Date: 11/23/2018 Moderate medial compartment narrowing was noted.  Mild patellofemoral narrowing was noted.  No chondrocalcinosis was noted. Impression: These findings are consistent with moderate osteoarthritis and mild chondromalacia patella  Xr Pelvis 1-2 Views  Result Date: 11/23/2018 No SI joint to sclerosis was noted.  No erosive changes in SI joints were noted. Impression: Unremarkable x-ray of the SI joints.   Recent Labs: Lab Results  Component Value Date   WBC 5.0 11/07/2018   HGB 12.8 11/07/2018   PLT 282.0 02/28/2017   NA 138 11/07/2018   K 4.4 11/07/2018   CL 101 11/07/2018   CO2 22 11/07/2018   GLUCOSE 114 (H) 11/07/2018   BUN 14 11/07/2018   CREATININE 0.71 11/07/2018   BILITOT 0.3 11/07/2018   ALKPHOS 173 (H) 11/07/2018   AST 17 11/07/2018   ALT 18 11/07/2018   PROT 7.4 11/07/2018   ALBUMIN 4.9 11/07/2018   CALCIUM 9.7 11/07/2018   GFRAA 112 11/07/2018  November 15, 2018 TPO negative thyroglobulin antibody negative, ESR 15, ANA positive no titer given, October 02, 2018 hemoglobin A1c 5.8  Speciality Comments: No specialty comments available.  Procedures:  No procedures performed Allergies: Patient has no known allergies.   Assessment / Plan:     Visit Diagnoses: Positive ANA (antinuclear antibody)-no titer is available.  Patient gives history of fatigue, chronic urticaria and Raynaud's phenomenon.  I will obtain AVISE labs today.  Raynaud's syndrome without gangrene-I did not notice any obvious Raynolds.  She had good capillary refill.  No nailbed capillary changes were noted.  No sclerodactyly was noted.  Chronic urticaria-she currently does not have any rash but she has a lot of excoriation marks and scarring from scratching.  Polyarthralgia-she complains of pain in multiple joints and intermittent swelling.  I did not see any synovitis on examination.  Pain in both hands -she has DIP and  PIP thickening on examination without synovitis Plan: XR Hand 2 View Right, XR Hand 2 View Left.  X-rays revealed mild osteoarthritic changes.  Chronic left SI joint pain -she complains of left SI joint.  Plan: XR Pelvis 1-2 Views.  The x-rays were unremarkable.  Pain in left hip -she had painful range of motion of her left hip joint.  Plan: XR HIP UNILAT W OR W/O PELVIS 2-3 VIEWS LEFT.  The x-ray was within normal limits.  Chronic pain of  left knee -she has discomfort range of motion of her left knee joint.  Plan: XR KNEE 3 VIEW LEFT.  Moderate osteoarthritis and mild chondromalacia patella was noted.  Pain in right foot -she has tenderness across MTPs of her right foot.  Plan: XR Foot 2 Views Right.  X-ray was consistent with mild osteoarthritis.  Plantar fasciitis, bilateral-she has tenderness over bilateral plantar fascia.  DDD (degenerative disc disease), lumbar-she has chronic lower back pain.  According to patient the x-rays in the past were consistent with disc disease of lumbar spine.  Hx of migraines  Anxiety state  Family history of inflammatory bowel disease - Crohn's disease in her son, ulcerative colitis and her daughter   Orders: Orders Placed This Encounter  Procedures  . XR KNEE 3 VIEW LEFT  . XR HIP UNILAT W OR W/O PELVIS 2-3 VIEWS LEFT  . XR Pelvis 1-2 Views  . XR Foot 2 Views Right  . XR Hand 2 View Right  . XR Hand 2 View Left  . Urinalysis, Routine w reflex microscopic   No orders of the defined types were placed in this encounter.   Face-to-face time spent with patient was 50 minutes. Greater than 50% of time was spent in counseling and coordination of care.  Follow-Up Instructions: Return for Positive ANA, osteoarthritis.   Bo Merino, MD  Note - This record has been created using Editor, commissioning.  Chart creation errors have been sought, but may not always  have been located. Such creation errors do not reflect on  the standard of medical  care.

## 2018-11-20 NOTE — Telephone Encounter (Signed)
The alkaline phosphate can be rechecked in about a month from initial lab.   We can order and redraw if she would like to have done at our office.  If it remains elevated would recommend she f/u with PCP to determine if she might warrant GI referral.

## 2018-11-23 ENCOUNTER — Ambulatory Visit (INDEPENDENT_AMBULATORY_CARE_PROVIDER_SITE_OTHER): Payer: Self-pay

## 2018-11-23 ENCOUNTER — Ambulatory Visit (INDEPENDENT_AMBULATORY_CARE_PROVIDER_SITE_OTHER): Payer: 59 | Admitting: Rheumatology

## 2018-11-23 ENCOUNTER — Encounter: Payer: Self-pay | Admitting: Rheumatology

## 2018-11-23 VITALS — BP 131/91 | HR 64 | Resp 12 | Ht 62.0 in | Wt 123.0 lb

## 2018-11-23 DIAGNOSIS — L508 Other urticaria: Secondary | ICD-10-CM | POA: Diagnosis not present

## 2018-11-23 DIAGNOSIS — R768 Other specified abnormal immunological findings in serum: Secondary | ICD-10-CM | POA: Diagnosis not present

## 2018-11-23 DIAGNOSIS — I7300 Raynaud's syndrome without gangrene: Secondary | ICD-10-CM | POA: Insufficient documentation

## 2018-11-23 DIAGNOSIS — M25552 Pain in left hip: Secondary | ICD-10-CM

## 2018-11-23 DIAGNOSIS — M2550 Pain in unspecified joint: Secondary | ICD-10-CM | POA: Diagnosis not present

## 2018-11-23 DIAGNOSIS — F411 Generalized anxiety disorder: Secondary | ICD-10-CM

## 2018-11-23 DIAGNOSIS — M5136 Other intervertebral disc degeneration, lumbar region: Secondary | ICD-10-CM

## 2018-11-23 DIAGNOSIS — M533 Sacrococcygeal disorders, not elsewhere classified: Secondary | ICD-10-CM

## 2018-11-23 DIAGNOSIS — Z8669 Personal history of other diseases of the nervous system and sense organs: Secondary | ICD-10-CM

## 2018-11-23 DIAGNOSIS — G8929 Other chronic pain: Secondary | ICD-10-CM

## 2018-11-23 DIAGNOSIS — M25562 Pain in left knee: Secondary | ICD-10-CM

## 2018-11-23 DIAGNOSIS — M79671 Pain in right foot: Secondary | ICD-10-CM | POA: Diagnosis not present

## 2018-11-23 DIAGNOSIS — M79641 Pain in right hand: Secondary | ICD-10-CM

## 2018-11-23 DIAGNOSIS — M79642 Pain in left hand: Secondary | ICD-10-CM | POA: Diagnosis not present

## 2018-11-23 DIAGNOSIS — M722 Plantar fascial fibromatosis: Secondary | ICD-10-CM

## 2018-11-23 DIAGNOSIS — Z8379 Family history of other diseases of the digestive system: Secondary | ICD-10-CM

## 2018-11-23 HISTORY — DX: Other urticaria: L50.8

## 2018-11-24 LAB — URINALYSIS, ROUTINE W REFLEX MICROSCOPIC
BILIRUBIN URINE: NEGATIVE
Glucose, UA: NEGATIVE
Hgb urine dipstick: NEGATIVE
Ketones, ur: NEGATIVE
LEUKOCYTES UA: NEGATIVE
Nitrite: NEGATIVE
PROTEIN: NEGATIVE
Specific Gravity, Urine: 1.013 (ref 1.001–1.03)
pH: 6 (ref 5.0–8.0)

## 2018-11-26 NOTE — Progress Notes (Signed)
UA negative

## 2018-12-14 DIAGNOSIS — M170 Bilateral primary osteoarthritis of knee: Secondary | ICD-10-CM | POA: Insufficient documentation

## 2018-12-14 NOTE — Progress Notes (Signed)
Office Visit Note  Patient: Crystal Dillon             Date of Birth: August 13, 1966           MRN: [id number]             PCP: Lucretia Kern, DO Referring: Lucretia Kern, DO Visit Date: 12/26/2018 Occupation: @GUAROCC @  Subjective:  Rash.   History of Present Illness: Crystal Dillon is a 52 y.o. female with history of urticaria and Raynaud's phenomenon.  She states she continues to have mild Raynolds in her feet.  She continues to have rash all over which she describes on her face and her extremities.  She states very pruritic and she has difficulty sleeping at night.  She has mild discomfort in her knee joints which is tolerable.  None of the other joints are as painful.  She has recurrent plantar fasciitis which is doing better currently.  She does have some lower back discomfort.  Activities of Daily Living:  Patient reports morning stiffness for 0 minute.   Patient Denies nocturnal pain.  Difficulty dressing/grooming: Denies Difficulty climbing stairs: Reports Difficulty getting out of chair: Denies Difficulty using hands for taps, buttons, cutlery, and/or writing: Denies  Review of Systems  Constitutional: Negative for fatigue, night sweats, weight gain and weight loss.  HENT: Negative for mouth sores, trouble swallowing, trouble swallowing, mouth dryness and nose dryness.   Eyes: Negative for pain, redness, visual disturbance and dryness.  Respiratory: Negative for cough, shortness of breath and difficulty breathing.   Cardiovascular: Negative for chest pain, palpitations, hypertension, irregular heartbeat and swelling in legs/feet.  Gastrointestinal: Negative for blood in stool, constipation and diarrhea.  Endocrine: Negative for increased urination.  Genitourinary: Negative for vaginal dryness.  Musculoskeletal: Positive for arthralgias and joint pain. Negative for joint swelling, myalgias, muscle weakness, morning stiffness, muscle tenderness and myalgias.  Skin: Positive  for rash. Negative for color change, hair loss, skin tightness, ulcers and sensitivity to sunlight.  Allergic/Immunologic: Negative for susceptible to infections.  Neurological: Positive for headaches. Negative for dizziness, memory loss, night sweats and weakness.  Hematological: Negative for swollen glands.  Psychiatric/Behavioral: Negative for depressed mood and sleep disturbance. The patient is nervous/anxious.     PMFS History:  Patient Active Problem List   Diagnosis Date Noted  . Primary osteoarthritis of both knees 12/14/2018  . Hx of migraines 11/23/2018  . Raynaud's syndrome without gangrene 11/23/2018  . Chronic urticaria 11/23/2018  . Family history of inflammatory bowel disease 11/23/2018  . Upper respiratory infection 02/28/2017  . Fibroids 09/20/2016  . Anxiety state 09/20/2016  . DDD (degenerative disc disease), lumbar 09/20/2016  . Migraine 09/20/2016  . Left hip pain 09/20/2016  . Chronic pain of both knees 09/20/2016  . Hyperglycemia 09/20/2016    Past Medical History:  Diagnosis Date  . Anemia   . Anxiety   . Chronic urticaria 11/23/2018  . Depression    -she denies hx depression, but she had stress during mother's illness  . Migraine   . RAYNAUDS SYNDROME 01/06/2011   Qualifier: Diagnosis of  By: Arnoldo Morale MD, Balinda Quails   . Uterine fibroid    diagnosed by OB/GYN Dr Mayra Neer  . Varicose veins of left lower extremity with both ulcer of ankle and inflammation ([place]) 07/07/2011   Referral to interventional radiology for vascular study and potential intervention     Family History  Problem Relation Age of Onset  . COPD Mother   . Hyperlipidemia Mother   .  Diabetes Mother   . Cancer Father        lung, smoker  . Diabetes Father   . Stroke Father   . Colon cancer Maternal Grandmother   . Diabetes Maternal Grandmother   . Healthy Sister   . Heart Problems Brother   . Crohn's disease Son   . Ulcerative colitis Daughter    Past Surgical History:    Procedure Laterality Date  . ADENOIDECTOMY    . CESAREAN SECTION    . HYSTEROSCOPY    . TONSILLECTOMY    . TONSILLECTOMY    . TUBAL LIGATION     Social History   Social History Narrative   Work or School: acounting       Home Situation: lives with husband and son 24 yo, and niece whom is 21 yo      Spiritual Beliefs: Christian      Lifestyle:trying to eat healthy; no regular exercise    Objective: Vital Signs: BP 118/80 (BP Location: Left Arm, Patient Position: Sitting, Cuff Size: Normal)   Pulse 71   Resp 12   Ht 5\' 1"  (1.549 m)   Wt 126 lb 12.8 oz (57.5 kg)   LMP 07/06/2011   BMI 23.96 kg/m    Physical Exam Vitals signs and nursing note reviewed.  Constitutional:      Appearance: She is well-developed.  HENT:     Head: Normocephalic and atraumatic.  Eyes:     Conjunctiva/sclera: Conjunctivae normal.  Neck:     Musculoskeletal: Normal range of motion.  Cardiovascular:     Rate and Rhythm: Normal rate and regular rhythm.     Heart sounds: Normal heart sounds.  Pulmonary:     Effort: Pulmonary effort is normal.     Breath sounds: Normal breath sounds.  Abdominal:     General: Bowel sounds are normal.     Palpations: Abdomen is soft.  Lymphadenopathy:     Cervical: No cervical adenopathy.  Skin:    General: Skin is warm and dry.     Capillary Refill: Capillary refill takes less than 2 seconds.     Findings: Erythema present.     Comments: No erythema noted over her face.  Excoriation marks noted on her extremities.  Neurological:     Mental Status: She is alert and oriented to person, place, and time.  Psychiatric:        Behavior: Behavior normal.      Musculoskeletal Exam: C-spine thoracic lumbar spine good range of motion.  Shoulder joints elbow joints wrist joint MCPs PIPs DIPs been good range of motion with no synovitis.  Hip joints knee joints ankles MTPs PIPs with good range of motion with no synovitis.  CDAI Exam: CDAI Score: Not  documented Patient Global Assessment: Not documented; Provider Global Assessment: Not documented Swollen: Not documented; Tender: Not documented Joint Exam   Not documented   There is currently no information documented on the homunculus. Go to the Rheumatology activity and complete the homunculus joint exam.  Investigation: No additional findings.  Imaging: No results found.  Recent Labs: Lab Results  Component Value Date   WBC 5.0 11/07/2018   HGB 12.8 11/07/2018   PLT 282.0 02/28/2017   NA 138 11/07/2018   K 4.4 11/07/2018   CL 101 11/07/2018   CO2 22 11/07/2018   GLUCOSE 114 (H) 11/07/2018   BUN 14 11/07/2018   CREATININE 0.71 11/07/2018   BILITOT 0.3 11/07/2018   ALKPHOS 173 (H) 11/07/2018   AST  17 11/07/2018   ALT 18 11/07/2018   PROT 7.4 11/07/2018   ALBUMIN 4.9 11/07/2018   CALCIUM 9.7 11/07/2018   GFRAA 112 11/07/2018  November 23, 2018 UA negative November 23, 2018 AVISE index -1.9, ANA negative, ENA negative, CB CAP negative, Jo 1-, anticardiolipin negative, beta-2 negative, RF negative, anti-CCP negative, antithyroglobulin negative, antithyroid peroxidase negative  Speciality Comments: No specialty comments available.  Procedures:  No procedures performed Allergies: Patient has no known allergies.   Assessment / Plan:     Visit Diagnoses: Positive ANA (antinuclear antibody) - AVISE index -1.9.  Patient's repeat ANA was negative.  ENA panel was negative.  All other autoimmune work-up was negative.  All the labs were discussed at length.  Raynaud's disease without gangrene - Per patient.  Not observed during the office visit.  Warm clothing was discussed.  Chronic urticaria-shear the erythema over her face and excoriation marks over her extremities.  I have advised her to clip her nails short and use cotton gloves at night.  She will follow-up with the allergist.  Elevated alkaline phosphatase-we discussed that the alkaline phosphatase should be repeated.   Patient plans to repeat it with her allergist.  If she has persistent elevation of alkaline phosphatase then she will need a total body bone scan.  Primary osteoarthritis of both knees - Bilateral moderate with mild chondromalacia patella.  Knee joint muscle strengthening exercise were discussed.  A handout was given.  Polyarthralgia - X-rays obtained during the last visit showed mild osteoarthritis in the hands and right foot.  Plantar fasciitis - Bilateral.  A handout on plantar fasciitis exercises were discussed.  Proper fitting shoes were discussed.  DDD (degenerative disc disease), lumbar-she is currently not having much discomfort.  Anxiety state  History of migraine   Orders: No orders of the defined types were placed in this encounter.  No orders of the defined types were placed in this encounter.    Follow-Up Instructions: No follow-ups on file.   Bo Merino, MD  Note - This record has been created using Editor, commissioning.  Chart creation errors have been sought, but may not always  have been located. Such creation errors do not reflect on  the standard of medical care.

## 2018-12-26 ENCOUNTER — Encounter: Payer: Self-pay | Admitting: Rheumatology

## 2018-12-26 ENCOUNTER — Ambulatory Visit: Payer: BLUE CROSS/BLUE SHIELD | Admitting: Rheumatology

## 2018-12-26 ENCOUNTER — Encounter: Payer: Self-pay | Admitting: Family Medicine

## 2018-12-26 VITALS — BP 118/80 | HR 71 | Resp 12 | Ht 61.0 in | Wt 126.8 lb

## 2018-12-26 DIAGNOSIS — M2550 Pain in unspecified joint: Secondary | ICD-10-CM

## 2018-12-26 DIAGNOSIS — R748 Abnormal levels of other serum enzymes: Secondary | ICD-10-CM

## 2018-12-26 DIAGNOSIS — M722 Plantar fascial fibromatosis: Secondary | ICD-10-CM

## 2018-12-26 DIAGNOSIS — R768 Other specified abnormal immunological findings in serum: Secondary | ICD-10-CM

## 2018-12-26 DIAGNOSIS — M170 Bilateral primary osteoarthritis of knee: Secondary | ICD-10-CM

## 2018-12-26 DIAGNOSIS — L508 Other urticaria: Secondary | ICD-10-CM

## 2018-12-26 DIAGNOSIS — I7300 Raynaud's syndrome without gangrene: Secondary | ICD-10-CM

## 2018-12-26 DIAGNOSIS — F411 Generalized anxiety disorder: Secondary | ICD-10-CM

## 2018-12-26 DIAGNOSIS — M5136 Other intervertebral disc degeneration, lumbar region: Secondary | ICD-10-CM

## 2018-12-26 DIAGNOSIS — Z8669 Personal history of other diseases of the nervous system and sense organs: Secondary | ICD-10-CM

## 2018-12-26 NOTE — Patient Instructions (Addendum)
Plantar Fasciitis Rehab Ask your health care provider which exercises are safe for you. Do exercises exactly as told by your health care provider and adjust them as directed. It is normal to feel mild stretching, pulling, tightness, or discomfort as you do these exercises, but you should stop right away if you feel sudden pain or your pain gets worse. Do not begin these exercises until told by your health care provider. Stretching and range of motion exercises These exercises warm up your muscles and joints and improve the movement and flexibility of your foot. These exercises also help to relieve pain. Exercise A: Plantar fascia stretch  1. Sit with your left / right leg crossed over your opposite knee. 2. Hold your heel with one hand with that thumb near your arch. With your other hand, hold your toes and gently pull them back toward the top of your foot. You should feel a stretch on the bottom of your toes or your foot or both. 3. Hold this stretch for__________ seconds. 4. Slowly release your toes and return to the starting position. Repeat __________ times. Complete this exercise __________ times a day. Exercise B: Gastroc, standing  1. Stand with your hands against a wall. 2. Extend your left / right leg behind you, and bend your front knee slightly. 3. Keeping your heels on the floor and keeping your back knee straight, shift your weight toward the wall without arching your back. You should feel a gentle stretch in your left / right calf. 4. Hold this position for __________ seconds. Repeat __________ times. Complete this exercise __________ times a day. Exercise C: Soleus, standing 1. Stand with your hands against a wall. 2. Extend your left / right leg behind you, and bend your front knee slightly. 3. Keeping your heels on the floor, bend your back knee and slightly shift your weight over the back leg. You should feel a gentle stretch deep in your calf. 4. Hold this position for  __________ seconds. Repeat __________ times. Complete this exercise __________ times a day. Exercise D: Gastrocsoleus, standing 1. Stand with the ball of your left / right foot on a step. The ball of your foot is on the walking surface, right under your toes. 2. Keep your other foot firmly on the same step. 3. Hold onto the wall or a railing for balance. 4. Slowly lift your other foot, allowing your body weight to press your heel down over the edge of the step. You should feel a stretch in your left / right calf. 5. Hold this position for __________ seconds. 6. Return both feet to the step. 7. Repeat this exercise with a slight bend in your left / right knee. Repeat __________ times with your left / right knee straight and __________ times with your left / right knee bent. Complete this exercise __________ times a day. Balance exercise This exercise builds your balance and strength control of your arch to help take pressure off your plantar fascia. Exercise E: Single leg stand 1. Without shoes, stand near a railing or in a doorway. You may hold onto the railing or door frame as needed. 2. Stand on your left / right foot. Keep your big toe down on the floor and try to keep your arch lifted. Do not let your foot roll inward. 3. Hold this position for __________ seconds. 4. If this exercise is too easy, you can try it with your eyes closed or while standing on a pillow. Repeat __________ times. Complete  this exercise __________ times a day. This information is not intended to replace advice given to you by your health care provider. Make sure you discuss any questions you have with your health care provider. Document Released: 12/05/2005 Document Revised: 08/09/2016 Document Reviewed: 10/19/2015 Elsevier Interactive Patient Education  2019 [employer].     Knee Exercises              Ask your health care provider which exercises are safe for you. Do exercises exactly as told by  your health care provider and adjust them as directed. It is normal to feel mild stretching, pulling, tightness, or discomfort as you do these exercises, but you should stop right away if you feel sudden pain or your pain gets worse.Do not begin these exercises until told by your health care provider. STRETCHING AND RANGE OF MOTION EXERCISES These exercises warm up your muscles and joints and improve the movement and flexibility of your knee. These exercises also help to relieve pain, numbness, and tingling. Exercise A: Knee Extension, Prone 1. Lie on your abdomen on a bed. 2. Place your left / right knee just beyond the edge of the surface so your knee is not on the bed. You can put a towel under your left / right thigh just above your knee for comfort. 3. Relax your leg muscles and allow gravity to straighten your knee. You should feel a stretch behind your left / right knee. 4. Hold this position for __________ seconds. 5. Scoot up so your knee is supported between repetitions. Repeat __________ times. Complete this stretch __________ times a day. Exercise B: Knee Flexion, Active 1. Lie on your back with both knees straight. If this causes back discomfort, bend your left / right knee so your foot is flat on the floor. 2. Slowly slide your left / right heel back toward your buttocks until you feel a gentle stretch in the front of your knee or thigh. 3. Hold this position for __________ seconds. 4. Slowly slide your left / right heel back to the starting position. Repeat __________ times. Complete this exercise __________ times a day. Exercise C: Quadriceps, Prone 1. Lie on your abdomen on a firm surface, such as a bed or padded floor. 2. Bend your left / right knee and hold your ankle. If you cannot reach your ankle or pant leg, loop a belt around your foot and grab the belt instead. 3. Gently pull your heel toward your buttocks. Your knee should not slide out to the side. You should feel a  stretch in the front of your thigh and knee. 4. Hold this position for __________ seconds. Repeat __________ times. Complete this stretch __________ times a day. Exercise D: Hamstring, Supine 1. Lie on your back. 2. Loop a belt or towel over the ball of your left / right foot. The ball of your foot is on the walking surface, right under your toes. 3. Straighten your left / right knee and slowly pull on the belt to raise your leg until you feel a gentle stretch behind your knee. ? Do not let your left / right knee bend while you do this. ? Keep your other leg flat on the floor. 4. Hold this position for __________ seconds. Repeat __________ times. Complete this stretch __________ times a day. STRENGTHENING EXERCISES These exercises build strength and endurance in your knee. Endurance is the ability to use your muscles for a long time, even after they get tired. Exercise E: Quadriceps, Isometric  1. Lie on your back with your left / right leg extended and your other knee bent. Put a rolled towel or small pillow under your knee if told by your health care provider. 2. Slowly tense the muscles in the front of your left / right thigh. You should see your kneecap slide up toward your hip or see increased dimpling just above the knee. This motion will push the back of the knee toward the floor. 3. For __________ seconds, keep the muscle as tight as you can without increasing your pain. 4. Relax the muscles slowly and completely. Repeat __________ times. Complete this exercise __________ times a day. Exercise F: Straight Leg Raises - Quadriceps 1. Lie on your back with your left / right leg extended and your other knee bent. 2. Tense the muscles in the front of your left / right thigh. You should see your kneecap slide up or see increased dimpling just above the knee. Your thigh may even shake a bit. 3. Keep these muscles tight as you raise your leg 4-6 inches (10-15 cm) off the floor. Do not let your  knee bend. 4. Hold this position for __________ seconds. 5. Keep these muscles tense as you lower your leg. 6. Relax your muscles slowly and completely after each repetition. Repeat __________ times. Complete this exercise __________ times a day. Exercise G: Hamstring, Isometric 1. Lie on your back on a firm surface. 2. Bend your left / right knee approximately __________ degrees. 3. Dig your left / right heel into the surface as if you are trying to pull it toward your buttocks. Tighten the muscles in the back of your thighs to dig as hard as you can without increasing any pain. 4. Hold this position for __________ seconds. 5. Release the tension gradually and allow your muscles to relax completely for __________ seconds after each repetition. Repeat __________ times. Complete this exercise __________ times a day. Exercise H: Hamstring Curls If told by your health care provider, do this exercise while wearing ankle weights. Begin with __________ weights. Then increase the weight by 1 lb (0.5 kg) increments. Do not wear ankle weights that are more than __________. 1. Lie on your abdomen with your legs straight. 2. Bend your left / right knee as far as you can without feeling pain. Keep your hips flat against the floor. 3. Hold this position for __________ seconds. 4. Slowly lower your leg to the starting position. Repeat __________ times. Complete this exercise __________ times a day. Exercise I: Squats (Quadriceps) 1. Stand in front of a table, with your feet and knees pointing straight ahead. You may rest your hands on the table for balance but not for support. 2. Slowly bend your knees and lower your hips like you are going to sit in a chair. ? Keep your weight over your heels, not over your toes. ? Keep your lower legs upright so they are parallel with the table legs. ? Do not let your hips go lower than your knees. ? Do not bend lower than told by your health care provider. ? If your  knee pain increases, do not bend as low. 3. Hold the squat position for __________ seconds. 4. Slowly push with your legs to return to standing. Do not use your hands to pull yourself to standing. Repeat __________ times. Complete this exercise __________ times a day. Exercise J: Wall Slides (Quadriceps) 1. Lean your back against a smooth wall or door while you walk your feet out 18-24  inches (46-61 cm) from it. 2. Place your feet hip-width apart. 3. Slowly slide down the wall or door until your knees bend __________ degrees. Keep your knees over your heels, not over your toes. Keep your knees in line with your hips. 4. Hold for __________ seconds. Repeat __________ times. Complete this exercise __________ times a day. Exercise K: Straight Leg Raises - Hip Abductors 1. Lie on your side with your left / right leg in the top position. Lie so your head, shoulder, knee, and hip line up. You may bend your bottom knee to help you keep your balance. 2. Roll your hips slightly forward so your hips are stacked directly over each other and your left / right knee is facing forward. 3. Leading with your heel, lift your top leg 4-6 inches (10-15 cm). You should feel the muscles in your outer hip lifting. ? Do not let your foot drift forward. ? Do not let your knee roll toward the ceiling. 4. Hold this position for __________ seconds. 5. Slowly return your leg to the starting position. 6. Let your muscles relax completely after each repetition. Repeat __________ times. Complete this exercise __________ times a day. Exercise L: Straight Leg Raises - Hip Extensors 1. Lie on your abdomen on a firm surface. You can put a pillow under your hips if that is more comfortable. 2. Tense the muscles in your buttocks and lift your left / right leg about 4-6 inches (10-15 cm). Keep your knee straight as you lift your leg. 3. Hold this position for __________ seconds. 4. Slowly lower your leg to the starting  position. 5. Let your leg relax completely after each repetition. Repeat __________ times. Complete this exercise __________ times a day. This information is not intended to replace advice given to you by your health care provider. Make sure you discuss any questions you have with your health care provider. Document Released: 10/19/2005 Document Revised: 08/29/2016 Document Reviewed: 10/11/2015 Elsevier Interactive Patient Education  2019 [employer].

## 2018-12-27 NOTE — Telephone Encounter (Signed)
Copied from [place] [id number]. Topic: Quick Communication - See Telephone Encounter >> Dec 27, 2018 12:19 PM Rutherford Nail, NT wrote: CRM for notification. See Telephone encounter for: 12/27/18. Patient calling to check the status of the mychart message that was sent yesterday (12/26/2018). Please advise.

## 2019-01-01 ENCOUNTER — Ambulatory Visit (INDEPENDENT_AMBULATORY_CARE_PROVIDER_SITE_OTHER): Payer: BLUE CROSS/BLUE SHIELD | Admitting: Family Medicine

## 2019-01-01 ENCOUNTER — Encounter: Payer: Self-pay | Admitting: Family Medicine

## 2019-01-01 VITALS — BP 110/80 | HR 65 | Temp 98.1°F | Ht 61.0 in | Wt 124.0 lb

## 2019-01-01 DIAGNOSIS — R945 Abnormal results of liver function studies: Secondary | ICD-10-CM

## 2019-01-01 DIAGNOSIS — R7989 Other specified abnormal findings of blood chemistry: Secondary | ICD-10-CM

## 2019-01-01 LAB — HEPATIC FUNCTION PANEL
ALT: 14 U/L (ref 0–35)
AST: 16 U/L (ref 0–37)
Albumin: 4.3 g/dL (ref 3.5–5.2)
Alkaline Phosphatase: 126 U/L — ABNORMAL HIGH (ref 39–117)
Bilirubin, Direct: 0.1 mg/dL (ref 0.0–0.3)
Total Bilirubin: 0.5 mg/dL (ref 0.2–1.2)
Total Protein: 7 g/dL (ref 6.0–8.3)

## 2019-01-01 NOTE — Progress Notes (Signed)
HPI:  Using dictation device. Unfortunately this device frequently misinterprets words/phrases.  Crystal Dillon is a pleasant 53 yo with a PMH significant for anxiety, chronic urticaria (sees allergies and saw rheumatology for eval), raynauds and a hx of + ANA. She has had a thorough evaluation with rheumatology and is here today as Alk Phos was mildly elevated and she was to recheck. No reported fevers, abd pain, bowel changes, bleeding, unexplained weight loss, fatigue, bone pain, pain in joints, malaise. Does not take vit D. She had extensive labs and xrays of the foot, pelvis, hip, knee and hand with rheumatologist. These showed OA mild hands, foot, knee, otherwise fairly unremarkable per review of rheum labs.  ROS: See pertinent positives and negatives per HPI.  Past Medical History:  Diagnosis Date  . Anemia   . Anxiety   . Chronic urticaria 11/23/2018  . Depression    -she denies hx depression, but she had stress during mother's illness  . Migraine   . RAYNAUDS SYNDROME 01/06/2011   Qualifier: Diagnosis of  By: Arnoldo Morale MD, Balinda Quails   . Uterine fibroid    diagnosed by OB/GYN Dr Mayra Neer  . Varicose veins of left lower extremity with both ulcer of ankle and inflammation ([place]) 07/07/2011   Referral to interventional radiology for vascular study and potential intervention     Past Surgical History:  Procedure Laterality Date  . ADENOIDECTOMY    . CESAREAN SECTION    . HYSTEROSCOPY    . TONSILLECTOMY    . TONSILLECTOMY    . TUBAL LIGATION      Family History  Problem Relation Age of Onset  . COPD Mother   . Hyperlipidemia Mother   . Diabetes Mother   . Cancer Father        lung, smoker  . Diabetes Father   . Stroke Father   . Colon cancer Maternal Grandmother   . Diabetes Maternal Grandmother   . Healthy Sister   . Heart Problems Brother   . Crohn's disease Son   . Ulcerative colitis Daughter     SOCIAL HX: se ehpi   Current Outpatient Medications:  .   cyclobenzaprine (FLEXERIL) 10 MG tablet, as needed. , Disp: , Rfl:  .  eletriptan (RELPAX) 40 MG tablet, TAKE 1 TABLET BY MOUTH AT ONSET OF HEADACHE MAY REPEAT IN 2 HOURS IF PERSISTS OR REOCCURS, Disp: 9 tablet, Rfl: 5 .  famotidine (PEPCID) 20 MG tablet, Take 1 tablet (20 mg total) by mouth 2 (two) times daily., Disp: 60 tablet, Rfl: 3 .  FLUoxetine (PROZAC) 20 MG capsule, Take 1 capsule (20 mg total) by mouth daily., Disp: 90 capsule, Rfl: 1 .  montelukast (SINGULAIR) 10 MG tablet, Take 1 tablet (10 mg total) by mouth at bedtime., Disp: 30 tablet, Rfl: 3 .  norethindrone ([name]) 0.35 MG tablet, Take 1 tablet by mouth daily., Disp: , Rfl:  .  ondansetron (ZOFRAN) 4 MG tablet, Take 1 tablet (4 mg total) by mouth every 8 (eight) hours as needed for nausea or vomiting (migraines)., Disp: 20 tablet, Rfl: 3 .  triamcinolone cream (KENALOG) 0.1 %, Apply 1 application topically 2 (two) times daily., Disp: 30 g, Rfl: 0  EXAM:  Vitals:   01/01/19 1322  BP: 110/80  Pulse: 65  Temp: 98.1 F (36.7 C)    Body mass index is 23.43 kg/m.  GENERAL: vitals reviewed and listed above, alert, oriented, appears well hydrated and in no acute distress  HEENT: atraumatic, conjunttiva clear, no obvious  abnormalities on inspection of external nose and ears  NECK: no obvious masses on inspection  LUNGS: clear to auscultation bilaterally, no wheezes, rales or rhonchi, good air movement  CV: HRRR, no peripheral edema  MS: moves all extremities without noticeable abnormality  PSYCH: pleasant and cooperative, no obvious depression or anxiety  ASSESSMENT AND PLAN:  Discussed the following assessment and plan:  Abnormal LFTs - Plan: Hepatic function panel  -we discussed possible serious and likely etiologies, workup and treatment, treatment risks and return precautions -after this discussion, Crystal Dillon opted for repeat LFT as last done in 10/2018 and if remains elevated also check ggt and Vit D levels. If  neg referral to endo for evaluation for bony disorders.  If ggt elevated liver US and GI referral. If vit D low, replete and recheck.  -follow up advised for physical and pending labs -of course, we advised Crystal Dillon  to return or notify a doctor immediately if symptoms or new concerns arise.  Decline avs as system/computer down at time of visit.  There are no Patient Instructions on file for this visit.  Lucretia Kern, DO

## 2019-01-03 NOTE — Addendum Note (Signed)
Addended by: Agnes Lawrence on: 01/03/2019 09:56 AM   Modules accepted: Orders

## 2019-01-04 ENCOUNTER — Ambulatory Visit: Payer: BLUE CROSS/BLUE SHIELD | Admitting: Allergy

## 2019-01-09 ENCOUNTER — Ambulatory Visit: Payer: 59 | Admitting: Allergy

## 2019-01-11 DIAGNOSIS — D259 Leiomyoma of uterus, unspecified: Secondary | ICD-10-CM | POA: Diagnosis not present

## 2019-01-11 DIAGNOSIS — R3914 Feeling of incomplete bladder emptying: Secondary | ICD-10-CM | POA: Diagnosis not present

## 2019-01-15 DIAGNOSIS — N912 Amenorrhea, unspecified: Secondary | ICD-10-CM | POA: Diagnosis not present

## 2019-01-15 DIAGNOSIS — D259 Leiomyoma of uterus, unspecified: Secondary | ICD-10-CM | POA: Diagnosis not present

## 2019-01-22 ENCOUNTER — Other Ambulatory Visit: Payer: Self-pay | Admitting: Family Medicine

## 2019-01-22 ENCOUNTER — Encounter: Payer: Self-pay | Admitting: Family Medicine

## 2019-01-22 MED ORDER — TRIAMCINOLONE ACETONIDE 0.1 % EX CREA: 1.0000 "application " | TOPICAL_CREAM | Freq: Two times a day (BID) | CUTANEOUS | 0 refills | Status: DC

## 2019-01-23 ENCOUNTER — Other Ambulatory Visit: Payer: Self-pay | Admitting: Obstetrics and Gynecology

## 2019-01-23 DIAGNOSIS — D250 Submucous leiomyoma of uterus: Secondary | ICD-10-CM

## 2019-01-24 ENCOUNTER — Other Ambulatory Visit: Payer: BLUE CROSS/BLUE SHIELD

## 2019-01-24 MED ORDER — FAMOTIDINE 20 MG PO TABS: 20.0000 mg | ORAL_TABLET | Freq: Two times a day (BID) | ORAL | 1 refills | Status: DC

## 2019-01-24 MED ORDER — MONTELUKAST SODIUM 10 MG PO TABS: 10.0000 mg | ORAL_TABLET | Freq: Every day | ORAL | 1 refills | Status: DC

## 2019-01-30 DIAGNOSIS — N920 Excessive and frequent menstruation with regular cycle: Secondary | ICD-10-CM | POA: Diagnosis not present

## 2019-01-30 DIAGNOSIS — R102 Pelvic and perineal pain: Secondary | ICD-10-CM | POA: Diagnosis not present

## 2019-01-30 DIAGNOSIS — D251 Intramural leiomyoma of uterus: Secondary | ICD-10-CM | POA: Diagnosis not present

## 2019-02-06 DIAGNOSIS — Z1231 Encounter for screening mammogram for malignant neoplasm of breast: Secondary | ICD-10-CM | POA: Diagnosis not present

## 2019-02-06 DIAGNOSIS — Z01419 Encounter for gynecological examination (general) (routine) without abnormal findings: Secondary | ICD-10-CM | POA: Diagnosis not present

## 2019-02-06 DIAGNOSIS — Z6822 Body mass index (BMI) 22.0-22.9, adult: Secondary | ICD-10-CM | POA: Diagnosis not present

## 2019-03-04 NOTE — H&P (Signed)
Crystal Dillon is an 53 y.o. female presents for surgical mngt of fibroids.  Menstrual History: Patient's last menstrual period was 07/06/2011.    Past Medical History:  Diagnosis Date  . Anemia   . Anxiety   . Chronic urticaria 11/23/2018  . Depression    -she denies hx depression, but she had stress during mother's illness  . Migraine   . RAYNAUDS SYNDROME 01/06/2011   Qualifier: Diagnosis of  By: Arnoldo Morale MD, Balinda Quails   . Uterine fibroid    diagnosed by OB/GYN Dr Mayra Neer  . Varicose veins of left lower extremity with both ulcer of ankle and inflammation ([place]) 07/07/2011   Referral to interventional radiology for vascular study and potential intervention     Past Surgical History:  Procedure Laterality Date  . ADENOIDECTOMY    . CESAREAN SECTION    . HYSTEROSCOPY    . TONSILLECTOMY    . TONSILLECTOMY    . TUBAL LIGATION      Family History  Problem Relation Age of Onset  . COPD Mother   . Hyperlipidemia Mother   . Diabetes Mother   . Cancer Father        lung, smoker  . Diabetes Father   . Stroke Father   . Colon cancer Maternal Grandmother   . Diabetes Maternal Grandmother   . Healthy Sister   . Heart Problems Brother   . Crohn's disease Son   . Ulcerative colitis Daughter     Social History:  reports that she has never smoked. She has never used smokeless tobacco. She reports that she does not drink alcohol or use drugs.  Allergies: No Known Allergies  No medications prior to admission.    ROS  Last menstrual period 07/06/2011. Physical Exam  AF, VSS Gen - NAD Abd - soft, NT CV - RRR Lungs - clear Ext - NT, no edema PV - uterus enlarged  Assessment/Plan:  Fibroid TAH/BSO  Crystal Dillon 03/04/2019, 1:26 PM

## 2019-04-10 ENCOUNTER — Telehealth: Payer: Self-pay | Admitting: *Deleted

## 2019-04-10 NOTE — Telephone Encounter (Signed)
Copied from [id number]. Topic: Appointment Scheduling - Transfer of Care >> Apr 10, 2019  9:59 AM Reyne Dumas L wrote: Pt is requesting to transfer FROM: Dr. Maudie Mercury Pt is requesting to transfer TO: Dr. Quay Burow Reason for requested transfer: Pt would like for Dr. Quay Burow to consider taking her on as a pt since Dr. Maudie Mercury is leaving [place].  Send CRM to patient's current PCP (transferring FROM).

## 2019-04-11 ENCOUNTER — Encounter: Payer: Self-pay | Admitting: Family Medicine

## 2019-04-11 NOTE — Telephone Encounter (Signed)
Ok

## 2019-04-15 MED ORDER — FLUOXETINE HCL 20 MG PO CAPS: 20.0000 mg | ORAL_CAPSULE | Freq: Every day | ORAL | 0 refills | Status: DC

## 2019-04-15 NOTE — Telephone Encounter (Signed)
Left message informing patient and advised to check other locations for a provider that is accepting new patients.

## 2019-04-15 NOTE — Telephone Encounter (Signed)
Let patient know I am not able to accept any new patients at this time.

## 2019-04-17 ENCOUNTER — Encounter: Payer: Self-pay | Admitting: Family Medicine

## 2019-05-09 ENCOUNTER — Telehealth: Payer: Self-pay | Admitting: *Deleted

## 2019-05-09 ENCOUNTER — Telehealth: Payer: Self-pay

## 2019-05-09 ENCOUNTER — Other Ambulatory Visit: Payer: Self-pay

## 2019-05-09 ENCOUNTER — Ambulatory Visit (INDEPENDENT_AMBULATORY_CARE_PROVIDER_SITE_OTHER): Payer: BLUE CROSS/BLUE SHIELD | Admitting: Family Medicine

## 2019-05-09 ENCOUNTER — Encounter: Payer: Self-pay | Admitting: Family Medicine

## 2019-05-09 VITALS — HR 82 | Temp 98.3°F

## 2019-05-09 DIAGNOSIS — J309 Allergic rhinitis, unspecified: Secondary | ICD-10-CM

## 2019-05-09 DIAGNOSIS — J019 Acute sinusitis, unspecified: Secondary | ICD-10-CM

## 2019-05-09 DIAGNOSIS — Z03818 Encounter for observation for suspected exposure to other biological agents ruled out: Secondary | ICD-10-CM

## 2019-05-09 DIAGNOSIS — Z20822 Contact with and (suspected) exposure to covid-19: Secondary | ICD-10-CM

## 2019-05-09 MED ORDER — AMOXICILLIN-POT CLAVULANATE 875-125 MG PO TABS: 1.0000 | ORAL_TABLET | Freq: Two times a day (BID) | ORAL | 0 refills | Status: DC

## 2019-05-09 NOTE — Telephone Encounter (Signed)
Please let her know if the health department is unable to help she may come to the public location at [facility] here in [place] or can place order for cone.

## 2019-05-09 NOTE — Telephone Encounter (Signed)
Crystal Dillon, CMA  [facility] [employer]        Provider: Dr Max Fickle [place] DOB 12-Apr-1966 MRN [id number]  Reason for visit: Acute sinusitis, recurrence not specified, unspecified location   Allergic rhinitis, unspecified seasonality, unspecified trigger   Insurance: [employer]    Phone call to pt.  Scheduled for COVID 19 screening on 5/22 @ 9:00 AM at the [facility]  Advised pt. to wear a mask and to remain in car, and follow instructions by the staff.  Verb. Understanding.

## 2019-05-09 NOTE — Patient Instructions (Signed)
Take the antibiotic as instructed.  Get COVID19 testing per the number provided. Call today.  Schedule follow up virtual visit with [place] if test is positive.  In the interim recommend self isolation, wear mask, do not go out, try to stay away from family members in separate room, wash hands frequently. Continue until negative test, at least 10 days from onset of symptoms (3 more days) AND 3 days of not fever over 100.4 AND improvement in symptoms for 3 days.  I hope you are feeling better soon! Seek care promptly if your symptoms worsen, new concerns arise or you are not improving with treatment.

## 2019-05-09 NOTE — Telephone Encounter (Signed)
I called the pt and informed her of the message below.  Patient stated she will call back for the follow up phone visit.

## 2019-05-09 NOTE — Progress Notes (Signed)
Patient cancelled appointment after note started.

## 2019-05-09 NOTE — Telephone Encounter (Signed)
Community message sent to the [facility] for COVID testing.

## 2019-05-09 NOTE — Progress Notes (Signed)
Virtual Visit via Video Note  I connected with Crystal Dillon  on 05/09/19 at 10:20 AM EDT by a video enabled telemedicine application and verified that I am speaking with the correct person using two identifiers.  Location patient: home Location provider:work or home office Persons participating in the virtual visit: patient, provider  I discussed the limitations of evaluation and management by telemedicine and the availability of in person appointments. The patient expressed understanding and agreed to proceed.   HPI:  Acute visit for "sinusitis" -nasal congestion for several weeks with allergies, worse the past week with sinus pain in the bilat maxiallary sinus pain worse with leaning forward, thicker congestion, cough, sore throat, HA -no fevers, SOB, no sick contacts, no COVID19 exposure, NVD -she has been out only to shop for essentials and wears mask and gloves and does follow social distancing -meds: allegra, singulair, musinex, chronically for allergies -lives with family member that are high risk for COVID - heart disease and diabetes  ROS: See pertinent positives and negatives per HPI.  Past Medical History:  Diagnosis Date  . Anemia   . Anxiety   . Chronic urticaria 11/23/2018  . Depression    -she denies hx depression, but she had stress during mother's illness  . Migraine   . RAYNAUDS SYNDROME 01/06/2011   Qualifier: Diagnosis of  By: Arnoldo Morale MD, Balinda Quails   . Uterine fibroid    diagnosed by OB/GYN Dr Mayra Neer  . Varicose veins of left lower extremity with both ulcer of ankle and inflammation ([place]) 07/07/2011   Referral to interventional radiology for vascular study and potential intervention     Past Surgical History:  Procedure Laterality Date  . ADENOIDECTOMY    . CESAREAN SECTION    . HYSTEROSCOPY    . TONSILLECTOMY    . TONSILLECTOMY    . TUBAL LIGATION      Family History  Problem Relation Age of Onset  . COPD Mother   . Hyperlipidemia Mother   . Diabetes  Mother   . Cancer Father        lung, smoker  . Diabetes Father   . Stroke Father   . Colon cancer Maternal Grandmother   . Diabetes Maternal Grandmother   . Healthy Sister   . Heart Problems Brother   . Crohn's disease Son   . Ulcerative colitis Daughter     SOCIAL HX: see hpi   Current Outpatient Medications:  .  cyclobenzaprine (FLEXERIL) 10 MG tablet, as needed. , Disp: , Rfl:  .  eletriptan (RELPAX) 40 MG tablet, TAKE 1 TABLET BY MOUTH AT ONSET OF HEADACHE MAY REPEAT IN 2 HOURS IF PERSISTS OR REOCCURS, Disp: 9 tablet, Rfl: 5 .  famotidine (PEPCID) 20 MG tablet, Take 1 tablet (20 mg total) by mouth 2 (two) times daily., Disp: 180 tablet, Rfl: 1 .  FLUoxetine (PROZAC) 20 MG capsule, Take 1 capsule (20 mg total) by mouth daily., Disp: 90 capsule, Rfl: 0 .  montelukast (SINGULAIR) 10 MG tablet, Take 1 tablet (10 mg total) by mouth at bedtime., Disp: 90 tablet, Rfl: 1 .  norethindrone ([name]) 0.35 MG tablet, Take 1 tablet by mouth daily., Disp: , Rfl:  .  ondansetron (ZOFRAN) 4 MG tablet, Take 1 tablet (4 mg total) by mouth every 8 (eight) hours as needed for nausea or vomiting (migraines)., Disp: 20 tablet, Rfl: 3 .  triamcinolone cream (KENALOG) 0.1 %, Apply 1 application topically 2 (two) times daily., Disp: 30 g, Rfl: 0 .  amoxicillin-clavulanate (  AUGMENTIN) 875-125 MG tablet, Take 1 tablet by mouth 2 (two) times daily., Disp: 20 tablet, Rfl: 0  EXAM:  VITALS per patient if applicable: denies fever  GENERAL: alert, oriented, appears well and in no acute distress  HEENT: atraumatic, conjunttiva clear, no obvious abnormalities on inspection of external nose and ears, moist mucus membranes  NECK: normal movements of the head and neck, no meningeal signs  LUNGS: on inspection no signs of respiratory distress, breathing rate appears normal, no obvious gross SOB, gasping or wheezing  CV: no obvious cyanosis  MS: moves all visible extremities without noticeable  abnormality  PSYCH/NEURO: pleasant and cooperative, no obvious depression or anxiety, speech and thought processing grossly intact  ASSESSMENT AND PLAN:  Discussed the following assessment and plan:  Acute sinusitis, recurrence not specified, unspecified location  Allergic rhinitis, unspecified seasonality, unspecified trigger  Encounter for observation for suspected exposure to other biological agents ruled out  -we discussed possible serious and likely etiologies, workup and treatment, treatment risks and return precautions. Suspect sinusitis most likely. In light of the [place] pandemic, despite her low risks of any exposures, given high risk family members she prefers to do COVID19 testing. Discussed options and she prefers to use the health department public testing sight - information.number provided. Discussed isolation protocol. Advised she call for follow up appt if test is positive. -after this discussion, Ica opted for Augmenting for presumed sinusitis after discussion of risks, COVID19 testing, isolation per CDC protocols. -follow up advised as scheduled and as needed -of course, we advised Daisa  to return or notify a doctor immediately if symptoms worsen or persist or new concerns arise.   I discussed the assessment and treatment plan with the patient. The patient was provided an opportunity to ask questions and all were answered. The patient agreed with the plan and demonstrated an understanding of the instructions.    Follow up instructions: Advised assistant Wendie Simmer to help patient arrange the following: -follow up with Dr. Maudie Mercury in 4 months -has meet and greet with Dr. Ethlyn Gallery already scheduled.  Lucretia Kern, DO

## 2019-05-09 NOTE — Telephone Encounter (Signed)
-----   Message from Lucretia Kern, DO sent at 05/09/2019 10:44 AM EDT ----- -follow up with Dr. Maudie Mercury in 4 months -has meet and greet with Dr. Ethlyn Gallery already scheduled.

## 2019-05-09 NOTE — Telephone Encounter (Signed)
Community message sent to the [facility] for testing.

## 2019-05-10 ENCOUNTER — Other Ambulatory Visit: Payer: Self-pay

## 2019-05-10 ENCOUNTER — Ambulatory Visit: Payer: BLUE CROSS/BLUE SHIELD | Admitting: Family Medicine

## 2019-05-10 ENCOUNTER — Other Ambulatory Visit: Payer: BLUE CROSS/BLUE SHIELD

## 2019-05-10 DIAGNOSIS — R6889 Other general symptoms and signs: Secondary | ICD-10-CM | POA: Diagnosis not present

## 2019-05-10 DIAGNOSIS — Z20822 Contact with and (suspected) exposure to covid-19: Secondary | ICD-10-CM

## 2019-05-13 LAB — NOVEL CORONAVIRUS, NAA: SARS-CoV-2, NAA: NOT DETECTED

## 2019-05-16 ENCOUNTER — Encounter (HOSPITAL_COMMUNITY): Admission: RE | Payer: Self-pay | Source: Home / Self Care

## 2019-05-16 ENCOUNTER — Ambulatory Visit (HOSPITAL_COMMUNITY)
Admission: RE | Admit: 2019-05-16 | Payer: BLUE CROSS/BLUE SHIELD | Source: Home / Self Care | Admitting: Obstetrics and Gynecology

## 2019-05-16 SURGERY — HYSTERECTOMY, ABDOMINAL, WITH SALPINGO-OOPHORECTOMY
Anesthesia: Choice | Laterality: Bilateral

## 2019-05-20 NOTE — H&P (Addendum)
Crystal Dillon is an 53 y.o. female  Presents for surgical mngt of fibroids.  Menstrual History: Patient's last menstrual period was 07/06/2011.    Past Medical History:  Diagnosis Date  . Anemia   . Anxiety   . Chronic urticaria 11/23/2018  . Depression    -she denies hx depression, but she had stress during mother's illness  . Migraine   . RAYNAUDS SYNDROME 01/06/2011   Qualifier: Diagnosis of  By: Arnoldo Morale MD, Balinda Quails   . Uterine fibroid    diagnosed by OB/GYN Dr Mayra Neer  . Varicose veins of left lower extremity with both ulcer of ankle and inflammation ([place]) 07/07/2011   Referral to interventional radiology for vascular study and potential intervention     Past Surgical History:  Procedure Laterality Date  . ADENOIDECTOMY    . CESAREAN SECTION    . HYSTEROSCOPY    . TONSILLECTOMY    . TONSILLECTOMY    . TUBAL LIGATION      Family History  Problem Relation Age of Onset  . COPD Mother   . Hyperlipidemia Mother   . Diabetes Mother   . Cancer Father        lung, smoker  . Diabetes Father   . Stroke Father   . Colon cancer Maternal Grandmother   . Diabetes Maternal Grandmother   . Healthy Sister   . Heart Problems Brother   . Crohn's disease Son   . Ulcerative colitis Daughter     Social History:  reports that she has never smoked. She has never used smokeless tobacco. She reports that she does not drink alcohol or use drugs.  Allergies: No Known Allergies  No medications prior to admission.    ROS  Last menstrual period 07/06/2011. Physical Exam  Gen - NAD CV - RRR Lungs - clear Abd - NT/ND Ext - NT, no edema  PV [place]:  Multiple intramural fibroids, largest 5.7cm.  3cm simple right ovarian cyst.  No free fluid  Assessment/Plan: Symptomatic Fibroids TAH/BSO R/b/a discussed, questions answered, informed consent  Crystal Dillon 05/20/2019, 5:18 PM

## 2019-05-30 ENCOUNTER — Encounter: Payer: Self-pay | Admitting: Family Medicine

## 2019-05-30 ENCOUNTER — Other Ambulatory Visit: Payer: Self-pay

## 2019-05-30 ENCOUNTER — Ambulatory Visit (INDEPENDENT_AMBULATORY_CARE_PROVIDER_SITE_OTHER): Payer: BC Managed Care – PPO | Admitting: Family Medicine

## 2019-05-30 VITALS — HR 85 | Temp 97.8°F

## 2019-05-30 DIAGNOSIS — R42 Dizziness and giddiness: Secondary | ICD-10-CM | POA: Diagnosis not present

## 2019-05-30 MED ORDER — ONDANSETRON HCL 4 MG PO TABS: 4.0000 mg | ORAL_TABLET | Freq: Three times a day (TID) | ORAL | 0 refills | Status: DC | PRN

## 2019-05-30 NOTE — Telephone Encounter (Signed)
Can you set her up televisit to talk?

## 2019-05-30 NOTE — Progress Notes (Signed)
Virtual Visit via Telephone Note  I connected with Crystal Dillon on 05/30/19 at  4:00 PM EDT by telephone and verified that I am speaking with the correct person using two identifiers.   Location patient: home Location provider: work or home office Participants present for the call: patient, provider Patient did not have a visit in the prior 7 days to address this/these issue(s).   History of Present Illness:   Vertigo: -started 1 week ago -hx of BPPV diagnosed with ENT in the past and feels the same, in the past did not tolerate any medications except for zofran for this -hx of migraines and uses zofran but was unable to get a refill for this, requesting refill -brief spells of vertigo and nausea triggered by sudden movements, particularly to the L -denies: fevers, malaise, sickness, visual changes, other neurological symptoms  Observations/Objective: Patient sounds cheerful and well on the phone. I do not appreciate any SOB. Speech and thought processing are grossly intact. Patient reported vitals:  Assessment and Plan:  Vertigo   -we discussed possible serious and likely etiologies, workup and treatment, treatment risks and return precautions. Given classic and recurrent symptoms c/w BPPV she opted to try home maneuvers as prefers to avoid OV given pandemic. Also refilled zofran. Advised no driving. -follow up advised if worsening, new symptoms or not resolved in the next few days with the home treatment   Follow Up Instructions:   I did not refer this patient for an OV in the next 24 hours for this/these issue(s).  I discussed the assessment and treatment plan with the patient. The patient was provided an opportunity to ask questions and all were answered. The patient agreed with the plan and demonstrated an understanding of the instructions.   The patient was advised to call back or seek an in-person evaluation if the symptoms worsen or if the condition fails to improve  as anticipated.  I provided 11 minutes of non-face-to-face time during this encounter.  Patient Instructions  Please see the link below for more information and a treatment for vertigo.  [email or web address]  Please do not drive with vertigo.  I hope you are feeling better soon! Seek care promptly if your symptoms worsen, new concerns arise or you are not improving with treatment.     Lucretia Kern, DO

## 2019-05-30 NOTE — Telephone Encounter (Signed)
I called the pt and scheduled a phone visit for today at 4pm.

## 2019-05-30 NOTE — Patient Instructions (Addendum)
Please see the link below for more information and a treatment for vertigo.  [email or web address]  Please do not drive with vertigo.  I hope you are feeling better soon! Seek care promptly if your symptoms worsen, new concerns arise or you are not improving with treatment.  

## 2019-05-31 DIAGNOSIS — M542 Cervicalgia: Secondary | ICD-10-CM | POA: Diagnosis not present

## 2019-06-03 ENCOUNTER — Telehealth: Payer: Self-pay | Admitting: *Deleted

## 2019-06-03 NOTE — Telephone Encounter (Signed)
Ok if this can be done safely (pods not over-crowded)

## 2019-06-03 NOTE — Telephone Encounter (Signed)
Patient has virtual appointment on 6/17. Seen Dr. Maudie Mercury last week for vertigo and still having problems wants to know cans she be seen in person instead of virtual.

## 2019-06-03 NOTE — Telephone Encounter (Signed)
I called the pt and informed her of the message below.  Appt information updated stating pt is coming in to the office.

## 2019-06-05 ENCOUNTER — Encounter: Payer: Self-pay | Admitting: Family Medicine

## 2019-06-05 ENCOUNTER — Ambulatory Visit (INDEPENDENT_AMBULATORY_CARE_PROVIDER_SITE_OTHER): Payer: BC Managed Care – PPO | Admitting: Family Medicine

## 2019-06-05 ENCOUNTER — Other Ambulatory Visit: Payer: Self-pay

## 2019-06-05 VITALS — BP 122/70 | HR 72 | Temp 98.3°F | Ht 61.0 in | Wt 123.1 lb

## 2019-06-05 DIAGNOSIS — F411 Generalized anxiety disorder: Secondary | ICD-10-CM | POA: Diagnosis not present

## 2019-06-05 DIAGNOSIS — G43809 Other migraine, not intractable, without status migrainosus: Secondary | ICD-10-CM

## 2019-06-05 DIAGNOSIS — R739 Hyperglycemia, unspecified: Secondary | ICD-10-CM

## 2019-06-05 DIAGNOSIS — H8110 Benign paroxysmal vertigo, unspecified ear: Secondary | ICD-10-CM

## 2019-06-05 MED ORDER — MECLIZINE HCL 12.5 MG PO TABS: 6.7500 mg | ORAL_TABLET | Freq: Three times a day (TID) | ORAL | 2 refills | Status: DC | PRN

## 2019-06-05 MED ORDER — FLUTICASONE PROPIONATE 50 MCG/ACT NA SUSP: 2.0000 | Freq: Every day | NASAL | 6 refills | Status: DC

## 2019-06-05 NOTE — Patient Instructions (Addendum)
Alexcis Ruffini      COVID TEST SCHEDULED FOR Monday, June 10, 2019 at 2:15pm.  MUST SELF QUARANTINE PER HANDOUT AFTER TESTING  ([address] [place])   Your procedure is scheduled on 06-13-2019  Report to [place]  at  [facility].  Call this number if you have problems the morning of surgery:[phone]  OUR ADDRESS IS [place], WE ARE LOCATED IN THE [facility]ICAL [facility] WITH [facility].   Remember:  Do not eat food or drink liquids after midnight.  Take these medicines the morning of surgery with A SIP OF WATER: FLUOXETINE (PROZAC), FEXOFEXADINE (ALLEGRA), FLONASE NASAL SPRY, FAMOTIDINE ( PEPCID)   Do not wear jewelry, make-up or nail polish.  Do not wear lotions, powders, or perfumes, or deoderant.  Do not shave 48 hours prior to surgery.  Men may shave face and neck.  Do not bring valuables to the hospital.  [facility] is not responsible for any belongings or valuables.  Contacts, dentures or bridgework may not be worn into surgery.  Leave your suitcase in the car.  After surgery it may be brought to your room.      Special instructions: YOU ARE SPENDING THE NIGHT, PLEASE BRING ALL PRESCRIPTION MEDICATIONS IN ORIGINAL CONTAINERS, NO VISITORS ALLOWED TO SPEND THE NIGHT. Please read over the following fact sheets that you were given:     Incentive Spirometer  An incentive spirometer is a tool that can help keep your lungs clear and active. This tool measures how well you are filling your lungs with each breath. Taking long deep breaths may help reverse or decrease the chance of developing breathing (pulmonary) problems (especially infection) following:  A long period of time when you are unable to move or be active. BEFORE THE PROCEDURE   If the spirometer includes an indicator to show your best effort, your nurse or respiratory therapist will set it to a desired goal.  If possible, sit up straight or lean slightly forward. Try not to  slouch.  Hold the incentive spirometer in an upright position. INSTRUCTIONS FOR USE  1. Sit on the edge of your bed if possible, or sit up as far as you can in bed or on a chair. 2. Hold the incentive spirometer in an upright position. 3. Breathe out normally. 4. Place the mouthpiece in your mouth and seal your lips tightly around it. 5. Breathe in slowly and as deeply as possible, raising the piston or the ball toward the top of the column. 6. Hold your breath for 3-5 seconds or for as long as possible. Allow the piston or ball to fall to the bottom of the column. 7. Remove the mouthpiece from your mouth and breathe out normally. 8. Rest for a few seconds and repeat Steps 1 through 7 at least 10 times every 1-2 hours when you are awake. Take your time and take a few normal breaths between deep breaths. 9. The spirometer may include an indicator to show your best effort. Use the indicator as a goal to work toward during each repetition. 10. After each set of 10 deep breaths, practice coughing to be sure your lungs are clear. If you have an incision (the cut made at the time of surgery), support your incision when coughing by placing a pillow or rolled up towels firmly against it. Once you are able to get out of bed, walk around indoors and cough well. You may stop using the  incentive spirometer when instructed by your caregiver.  RISKS AND COMPLICATIONS  Take your time so you do not get dizzy or light-headed.  If you are in pain, you may need to take or ask for pain medication before doing incentive spirometry. It is harder to take a deep breath if you are having pain. AFTER USE  Rest and breathe slowly and easily.  It can be helpful to keep track of a log of your progress. Your caregiver can provide you with a simple table to help with this. If you are using the spirometer at home, follow these instructions: [place] IF:   You are having difficultly using the spirometer.  You  have trouble using the spirometer as often as instructed.  Your pain medication is not giving enough relief while using the spirometer.  You develop fever of 100.5 F (38.1 C) or higher. SEEK IMMEDIATE MEDICAL CARE IF:   You cough up bloody sputum that had not been present before.  You develop fever of 102 F (38.9 C) or greater.  You develop worsening pain at or near the incision site. MAKE SURE YOU:   Understand these instructions.  Will watch your condition.  Will get help right away if you are not doing well or get worse. Document Released: 04/17/2007 Document Revised: 02/27/2012 Document Reviewed: 06/18/2007 ExitCare Patient Information 2014 ExitCare, [place].   ________________________________________________________________________  WHAT IS A BLOOD TRANSFUSION? Blood Transfusion Information  A transfusion is the replacement of blood or some of its parts. Blood is made up of multiple cells which provide different functions.  Red blood cells carry oxygen and are used for blood loss replacement.  White blood cells fight against infection.  Platelets control bleeding.  Plasma helps clot blood.  Other blood products are available for specialized needs, such as hemophilia or other clotting disorders. BEFORE THE TRANSFUSION  Who gives blood for transfusions?   Healthy volunteers who are fully evaluated to make sure their blood is safe. This is blood bank blood. Transfusion therapy is the safest it has ever been in the practice of medicine. Before blood is taken from a donor, a complete history is taken to make sure that person has no history of diseases nor engages in risky social behavior (examples are intravenous drug use or sexual activity with multiple partners). The donor's travel history is screened to minimize risk of transmitting infections, such as malaria. The donated blood is tested for signs of infectious diseases, such as HIV and hepatitis. The blood is then  tested to be sure it is compatible with you in order to minimize the chance of a transfusion reaction. If you or a relative donates blood, this is often done in anticipation of surgery and is not appropriate for emergency situations. It takes many days to process the donated blood. RISKS AND COMPLICATIONS Although transfusion therapy is very safe and saves many lives, the main dangers of transfusion include:   Getting an infectious disease.  Developing a transfusion reaction. This is an allergic reaction to something in the blood you were given. Every precaution is taken to prevent this. The decision to have a blood transfusion has been considered carefully by your caregiver before blood is given. Blood is not given unless the benefits outweigh the risks. AFTER THE TRANSFUSION  Right after receiving a blood transfusion, you will usually feel much better and more energetic. This is especially true if your red blood cells have gotten low (anemic). The transfusion raises the level of the  red blood cells which carry oxygen, and this usually causes an energy increase.  The nurse administering the transfusion will monitor you carefully for complications. HOME CARE INSTRUCTIONS  No special instructions are needed after a transfusion. You may find your energy is better. Speak with your caregiver about any limitations on activity for underlying diseases you may have. SEEK MEDICAL CARE IF:   Your condition is not improving after your transfusion.  You develop redness or irritation at the intravenous (IV) site. SEEK IMMEDIATE MEDICAL CARE IF:  Any of the following symptoms occur over the next 12 hours:  Shaking chills.  You have a temperature by mouth above 102 F (38.9 C), not controlled by medicine.  Chest, back, or muscle pain.  People around you feel you are not acting correctly or are confused.  Shortness of breath or difficulty breathing.  Dizziness and fainting.  You get a rash or  develop hives.  You have a decrease in urine output.  Your urine turns a dark color or changes to pink, red, or brown. Any of the following symptoms occur over the next 10 days:  You have a temperature by mouth above 102 F (38.9 C), not controlled by medicine.  Shortness of breath.  Weakness after normal activity.  The white part of the eye turns yellow (jaundice).  You have a decrease in the amount of urine or are urinating less often.  Your urine turns a dark color or changes to pink, red, or brown. Document Released: 12/02/2000 Document Revised: 02/27/2012 Document Reviewed: 07/21/2008 ExitCare Patient Information 2014 [place].  _______________________________________________________________________    [facility] - Preparing for Surgery Before surgery, you can play an important role.  Because skin is not sterile, your skin needs to be as free of germs as possible.  You can reduce the number of germs on your skin by washing with CHG (chlorahexidine gluconate) soap before surgery.  CHG is an antiseptic cleaner which kills germs and bonds with the skin to continue killing germs even after washing. Please DO NOT use if you have an allergy to CHG or antibacterial soaps.  If your skin becomes reddened/irritated stop using the CHG and inform your nurse when you arrive at Short Stay. Do not shave (including legs and underarms) for at least 48 hours prior to the first CHG shower.  You may shave your face/neck.  Please follow these instructions carefully:  1.  Shower with CHG Soap the night before surgery and the  morning of surgery.  2.  If you choose to wash your hair, wash your hair first as usual with your normal  shampoo.  3.  After you shampoo, rinse your hair and body thoroughly to remove the shampoo.                             4.  Use CHG as you would any other liquid soap.  You can apply chg directly to the skin and wash.  Gently with a scrungie or clean washcloth.  5.   Apply the CHG Soap to your body ONLY FROM THE NECK DOWN.   Do   not use on face/ open                           Wound or open sores. Avoid contact with eyes, ears mouth and   genitals (private parts).  Wash face,  Genitals (private parts) with your normal soap.             6.  Wash thoroughly, paying special attention to the area where your    surgery  will be performed.  7.  Thoroughly rinse your body with warm water from the neck down.  8.  DO NOT shower/wash with your normal soap after using and rinsing off the CHG Soap.                9.  Pat yourself dry with a clean towel.            10.  Wear clean pajamas.            11.  Place clean sheets on your bed the night of your first shower and do not  sleep with pets. Day of Surgery : Do not apply any lotions/deodorants the morning of surgery.  Please wear clean clothes to the hospital/surgery center.  FAILURE TO FOLLOW THESE INSTRUCTIONS MAY RESULT IN THE CANCELLATION OF YOUR SURGERY  PATIENT SIGNATURE_________________________________  NURSE SIGNATURE__________________________________  ________________________________________________________________________

## 2019-06-05 NOTE — Patient Instructions (Addendum)
Google: [facility] epley maneuver   Trial of portion of meclizine 30 minutes prior to exercises.   Start flonase daily.   Update me in 2 weeks time or sooner if worsening.    Benign Positional Vertigo  Vertigo is the feeling that you or your surroundings are moving when they are not. Benign positional vertigo is the most common form of vertigo. The cause of this condition is not serious (is benign). This condition is triggered by certain movements and positions (is positional). This condition can be dangerous if it occurs while you are doing something that could endanger you or others, such as driving. What are the causes? In many cases, the cause of this condition is not known. It may be caused by a disturbance in an area of the inner ear that helps your brain to sense movement and balance. This disturbance can be caused by a viral infection (labyrinthitis), head injury, or repetitive motion. What increases the risk? This condition is more likely to develop in:  Women.  People who are 25 years of age or older.   What are the signs or symptoms? Symptoms of this condition usually happen when you move your head or your eyes in different directions. Symptoms may start suddenly, and they usually last for less than a minute. Symptoms may include:  Loss of balance and falling.  Feeling like you are spinning or moving.  Feeling like your surroundings are spinning or moving.  Nausea and vomiting.  Blurred vision.  Dizziness.  Involuntary eye movement (nystagmus).   Symptoms can be mild and cause only slight annoyance, or they can be severe and interfere with daily life. Episodes of benign positional vertigo may return (recur) over time, and they may be triggered by certain movements. Symptoms may improve over time. How is this diagnosed? This condition is usually diagnosed by medical history and a physical exam of the head, neck, and ears. You may be referred to a health care  provider who specializes in ear, nose, and throat (ENT) problems (otolaryngologist) or a provider who specializes in disorders of the nervous system (neurologist). You may have additional testing, including:  MRI.  A CT scan.  Eye movement tests. Your health care provider may ask you to change positions quickly while he or she watches you for symptoms of benign positional vertigo, such as nystagmus. Eye movement may be tested with an electronystagmogram (ENG), caloric stimulation, the Dix-Hallpike test, or the roll test.  An electroencephalogram (EEG). This records electrical activity in your brain.  Hearing tests.   How is this treated? Usually, your health care provider will treat this by moving your head in specific positions to adjust your inner ear back to normal. Surgery may be needed in severe cases, but this is rare. In some cases, benign positional vertigo may resolve on its own in 2-4 weeks. Follow these instructions at home: Safety  Move slowly.Avoid sudden body or head movements.  Avoid driving.  Avoid operating heavy machinery.  Avoid doing any tasks that would be dangerous to you or others if a vertigo episode would occur.  If you have trouble walking or keeping your balance, try using a cane for stability. If you feel dizzy or unstable, sit down right away.  Return to your normal activities as told by your health care provider. Ask your health care provider what activities are safe for you. General instructions  Take over-the-counter and prescription medicines only as told by your health care provider.  Avoid certain  positions or movements as told by your health care provider.  Drink enough fluid to keep your urine clear or pale yellow.  Keep all follow-up visits as told by your health care provider. This is important. Contact a health care provider if:  You have a fever.  Your condition gets worse or you develop new symptoms.  Your family or friends notice  any behavioral changes.  Your nausea or vomiting gets worse.  You have numbness or a "pins and needles" sensation. Get help right away if:  You have difficulty speaking or moving.  You are always dizzy.  You faint.  You develop severe headaches.  You have weakness in your legs or arms.  You have changes in your hearing or vision.  You develop a stiff neck.  You develop sensitivity to light. This information is not intended to replace advice given to you by your health care provider. Make sure you discuss any questions you have with your health care provider. Document Released: 09/12/2006 Document Revised: 05/12/2016 Document Reviewed: 03/30/2015 Elsevier Interactive Patient Education  2018 [employer].    How to Perform the [place], [place] vertigo description and maneuver  The Epley maneuver is an exercise that relieves symptoms of vertigo. Vertigo is the feeling that you or your surroundings are moving when they are not. When you feel vertigo, you may feel like the room is spinning and have trouble walking. Dizziness is a little different than vertigo. When you are dizzy, you may feel unsteady or light-headed. You can do this maneuver at home whenever you have symptoms of vertigo. You can do it up to 3 times a day until your symptoms go away. Even though the Epley maneuver may relieve your vertigo for a few weeks, it is possible that your symptoms will return. This maneuver relieves vertigo, but it does not relieve dizziness. What are the risks? If it is done correctly, the Epley maneuver is considered safe. Sometimes it can lead to dizziness or nausea that goes away after a short time. If you develop other symptoms, such as changes in vision, weakness, or numbness, stop doing the maneuver and call your health care provider. How to perform the Epley maneuver 1. Sit on the edge of a bed or table with your back straight and your legs extended or hanging over  the edge of the bed or table. 2. Turn your head halfway toward the affected ear or side. 3. Lie backward quickly with your head turned until you are lying flat on your back. You may want to position a pillow under your shoulders. 4. Hold this position for 30 seconds. You may experience an attack of vertigo. This is normal. 5. Turn your head to the opposite direction until your unaffected ear is facing the floor. 6. Hold this position for 30 seconds. You may experience an attack of vertigo. This is normal. Hold this position until the vertigo stops. 7. Turn your whole body to the same side as your head. Hold for another 30 seconds. 8. Sit back up. You can repeat this exercise up to 3 times a day. Follow these instructions at home:  After doing the Epley maneuver, you can return to your normal activities.  Ask your health care provider if there is anything you should do at home to prevent vertigo. He or she may recommend that you: ? Keep your head raised (elevated) with two or more pillows while you sleep. ? Do not sleep on the side  of your affected ear. ? Get up slowly from bed. ? Avoid sudden movements during the day. ? Avoid extreme head movement, like looking up or bending over. Contact a health care provider if:  Your vertigo gets worse.  You have other symptoms, including: ? Nausea. ? Vomiting. ? Headache. Get help right away if:  You have vision changes.  You have a severe or worsening headache or neck pain.  You cannot stop vomiting.  You have new numbness or weakness in any part of your body. Summary  Vertigo is the feeling that you or your surroundings are moving when they are not.  The Epley maneuver is an exercise that relieves symptoms of vertigo.  If the Epley maneuver is done correctly, it is considered safe. You can do it up to 3 times a day. This information is not intended to replace advice given to you by your health care provider. Make sure you discuss any  questions you have with your health care provider. Document Released: 12/10/2013 Document Revised: 10/25/2016 Document Reviewed: 10/25/2016 Elsevier Interactive Patient Education  2017 [employer].

## 2019-06-05 NOTE — Progress Notes (Signed)
Kenleigh Toback DOB: December 21, 1965 Encounter date: 06/05/2019  This is a 53 y.o. female who presents to establish care. Chief Complaint  Patient presents with  . Establish Care  . Dizziness    History of present illness:  Last saw [facility] on 05/30/19 for vertigo which started a week ago. Hx of BPPV dx by ENT kin past; felt same. Decided to try zofran, home maneuvers.  Monday was the "worst day ever". Felt sick to sit up, walk, balance worse than it has ever been. Zofran has helped. Monday took dramamine as well which almost made her feel worse. Yesterday not as bad; today somewhat better. This is about 10th day. Has tried the exercises and they make her feel very bad. When she had this in past she did maneuver in office and then wore neck brace, sat sitting up in recliner. Gets sleepy and doesn't feel well with the episodes in past. Today notes that if she sits still and doesn't move at all she is ok. If moves head to left and to right it spins. Notes with lying down; turning over in bed. If she can move whole body it is not quite as bad. Started off trying to do exercises more often but hasn't done them since Monday. Did sleep inclined after exercises.   Migraine: Has had these since early teen years. Uses relpax which does help. Sometimes takes a few hours to go away. Frequency varies but change in weather will trigger sometimes. Average at least once a week; sometimes more. Usually wake her up (those are bad ones). Was on topamax at one point but it made her feel crazy so didn't like this.   Raynaud's: just cold all the time. Manages this with dressing in layers.   Anxiety: Had multiple family members pass within 6 mo time period. Does ok on prozac. Sleeps well.   DDD/Arthritis: sporatic. Lower back - feels like it locks up and then she can't move until it releases (takes a couple of minutes). Then she is ok once it releases.   Fibroids: following with Dr. Julien Girt  Allergies: takes allegra,  singulair, pepcid. Started with urticaria. Seeing allergy specialist and then saw Dr. Nelva Bush. Then referred to Dr Estanislado Pandy. All testing was ok.   Right ear pops and left ear rings. Had sinus infection 2-3 weeks ago and was tx with amoxicillin. Still feels like she has a little congestion. Little frontal pressure.   Had nasal spray at one time but stopped using it.   Past Medical History:  Diagnosis Date  . Anemia   . Anxiety   . Chronic urticaria 11/23/2018  . Depression    -she denies hx depression, but she had stress during mother's illness  . Migraine   . RAYNAUDS SYNDROME 01/06/2011   Qualifier: Diagnosis of  By: Arnoldo Morale MD, Balinda Quails   . Uterine fibroid    diagnosed by OB/GYN Dr Mayra Neer  . Varicose veins of left lower extremity with both ulcer of ankle and inflammation ([place]) 07/07/2011   Referral to interventional radiology for vascular study and potential intervention    Past Surgical History:  Procedure Laterality Date  . ADENOIDECTOMY    . CESAREAN SECTION    . HYSTEROSCOPY    . TONSILLECTOMY    . TONSILLECTOMY    . TUBAL LIGATION     No Known Allergies Current Meds  Medication Sig  . eletriptan (RELPAX) 40 MG tablet TAKE 1 TABLET BY MOUTH AT ONSET OF HEADACHE MAY REPEAT IN 2 HOURS  IF PERSISTS OR REOCCURS  . famotidine (PEPCID) 20 MG tablet Take 1 tablet (20 mg total) by mouth 2 (two) times daily.  . fexofenadine (ALLEGRA) 180 MG tablet Take 180 mg by mouth daily.  Marland Kitchen FLUoxetine (PROZAC) 20 MG capsule Take 1 capsule (20 mg total) by mouth daily.  . montelukast (SINGULAIR) 10 MG tablet Take 1 tablet (10 mg total) by mouth at bedtime.  . norethindrone ([name]) 0.35 MG tablet Take 1 tablet by mouth daily.  . ondansetron (ZOFRAN) 4 MG tablet Take 1 tablet (4 mg total) by mouth every 8 (eight) hours as needed for nausea or vomiting (migraines).  . triamcinolone cream (KENALOG) 0.1 % Apply 1 application topically 2 (two) times daily. (Patient taking differently: Apply 1  application topically 2 (two) times daily as needed (irritation). )   Social History   Tobacco Use  . Smoking status: Never Smoker  . Smokeless tobacco: Never Used  Substance Use Topics  . Alcohol use: No    Comment: occ   Family History  Problem Relation Age of Onset  . COPD Mother   . Hyperlipidemia Mother   . Diabetes Mother   . Cancer Father        lung, smoker  . Diabetes Father   . Stroke Father   . Colon cancer Maternal Grandmother   . Diabetes Maternal Grandmother   . Healthy Sister   . Heart Problems Brother   . Crohn's disease Son   . Ulcerative colitis Daughter      Review of Systems  Constitutional: Negative for chills, fatigue and fever.  Respiratory: Negative for cough, chest tightness, shortness of breath and wheezing.   Cardiovascular: Negative for chest pain, palpitations and leg swelling.  Neurological: Positive for dizziness (see hpi) and headaches (chronic). Negative for light-headedness.    Objective:  BP 122/70 (BP Location: Left Arm, Patient Position: Sitting, Cuff Size: Normal)   Pulse 72   Temp 98.3 F (36.8 C) (Oral)   Ht 5\' 1"  (1.549 m)   Wt 123 lb 1.6 oz (55.8 kg)   LMP 07/06/2011   SpO2 98%   BMI 23.26 kg/m   Weight: 123 lb 1.6 oz (55.8 kg)   BP Readings from Last 3 Encounters:  06/05/19 122/70  01/01/19 110/80  12/26/18 118/80   Wt Readings from Last 3 Encounters:  06/05/19 123 lb 1.6 oz (55.8 kg)  01/01/19 124 lb (56.2 kg)  12/26/18 126 lb 12.8 oz (57.5 kg)    Physical Exam Constitutional:      General: She is not in acute distress.    Appearance: She is well-developed. She is not diaphoretic.  HENT:     Head: Normocephalic and atraumatic.     Right Ear: External ear normal.     Left Ear: External ear normal.  Eyes:     Conjunctiva/sclera: Conjunctivae normal.     Pupils: Pupils are equal, round, and reactive to light.  Neck:     Musculoskeletal: Neck supple.     Thyroid: No thyromegaly.  Cardiovascular:      Rate and Rhythm: Normal rate and regular rhythm.     Heart sounds: Normal heart sounds. No murmur. No friction rub. No gallop.   Pulmonary:     Effort: Pulmonary effort is normal. No respiratory distress.     Breath sounds: Normal breath sounds. No wheezing or rales.  Lymphadenopathy:     Cervical: No cervical adenopathy.  Skin:    General: Skin is warm and dry.  Neurological:  Mental Status: She is alert and oriented to person, place, and time.     Cranial Nerves: No cranial nerve deficit.     Motor: No abnormal muscle tone.     Deep Tendon Reflexes: Reflexes normal.     Reflex Scores:      Tricep reflexes are 2+ on the right side and 2+ on the left side.      Bicep reflexes are 2+ on the right side and 2+ on the left side.      Brachioradialis reflexes are 2+ on the right side and 2+ on the left side.      Patellar reflexes are 2+ on the right side and 2+ on the left side.    Comments: Neuro exam is stable; slight dizziness with change from lying to sitting, but no nystagmus noted and sx very minimal.  Psychiatric:        Behavior: Behavior normal.     Assessment/Plan: 1. Benign paroxysmal positional vertigo, unspecified laterality She has had some improvement today. We discussed treating sinus congestion as well which may help with overall sx of ear congestion and ringing left ear which started with sinus infection just a few weeks ago. Discussed taking portion of meclizine tablet 30 minutes prior to trying exercises in case not continuing to improve in next 48 hours. Encouraged to review the [facility] website with epley explanation. If any worsening of sx; let me know. If not resolved in 2 weeks let us know.   2. Hyperglycemia Has been stable.  3. Other migraine without status migrainosus, not intractable Stable; improved with relpax.  4. Anxiety state Stable.  Continue prozac.   Return if symptoms worsen or fail to improve.  Micheline Rough, MD

## 2019-06-06 ENCOUNTER — Other Ambulatory Visit: Payer: Self-pay

## 2019-06-06 ENCOUNTER — Encounter (HOSPITAL_COMMUNITY)
Admission: RE | Admit: 2019-06-06 | Discharge: 2019-06-06 | Disposition: A | Discharge status: Home / Self Care | Payer: BC Managed Care – PPO | Source: Ambulatory Visit | Attending: Obstetrics and Gynecology | Admitting: Obstetrics and Gynecology

## 2019-06-06 ENCOUNTER — Encounter (HOSPITAL_COMMUNITY): Payer: Self-pay

## 2019-06-06 DIAGNOSIS — Z01812 Encounter for preprocedural laboratory examination: Secondary | ICD-10-CM | POA: Insufficient documentation

## 2019-06-06 HISTORY — DX: Other allergic rhinitis: J30.89

## 2019-06-06 HISTORY — DX: Dizziness and giddiness: R42

## 2019-06-06 HISTORY — DX: Unspecified osteoarthritis, unspecified site: M19.90

## 2019-06-06 HISTORY — DX: Other specified postprocedural states: R11.2

## 2019-06-06 HISTORY — DX: Other complications of anesthesia, initial encounter: T88.59XA

## 2019-06-06 HISTORY — DX: Other specified postprocedural states: Z98.890

## 2019-06-06 HISTORY — DX: Gastro-esophageal reflux disease without esophagitis: K21.9

## 2019-06-06 LAB — CBC
HCT: 38.4 % (ref 36.0–46.0)
Hemoglobin: 12.4 g/dL (ref 12.0–15.0)
MCH: 30 pg (ref 26.0–34.0)
MCHC: 32.3 g/dL (ref 30.0–36.0)
MCV: 93 fL (ref 80.0–100.0)
Platelets: 307 10*3/uL (ref 150–400)
RBC: 4.13 MIL/uL (ref 3.87–5.11)
RDW: 13.6 % (ref 11.5–15.5)
WBC: 5.6 10*3/uL (ref 4.0–10.5)
nRBC: 0 % (ref 0.0–0.2)

## 2019-06-06 NOTE — Progress Notes (Signed)
SPOKE W/  Neoma Laming     SCREENING SYMPTOMS OF COVID 19:   COUGH--NO  RUNNY NOSE--- NO  SORE THROAT---NO  NASAL CONGESTION----NO  SNEEZING----NO  SHORTNESS OF BREATH---NO  DIFFICULTY BREATHING---NO  TEMP >100.0 -----NO  UNEXPLAINED BODY ACHES------NO  CHILLS -------- NO  HEADACHES ---------NO  LOSS OF SMELL/ TASTE --------NO    HAVE YOU OR ANY FAMILY MEMBER TRAVELLED PAST 14 DAYS OUT OF THE   COUNTY---LIVES IN [place]----TRAVELLED TO [place] COUNTRY----NO  HAVE YOU OR ANY FAMILY MEMBER BEEN EXPOSED TO ANYONE WITH COVID 19? NO

## 2019-06-07 LAB — ABO/RH: ABO/RH(D): O POS

## 2019-06-10 ENCOUNTER — Other Ambulatory Visit (HOSPITAL_COMMUNITY)
Admission: RE | Admit: 2019-06-10 | Discharge: 2019-06-10 | Disposition: A | Discharge status: Home / Self Care | Payer: BC Managed Care – PPO | Source: Ambulatory Visit | Attending: Obstetrics and Gynecology | Admitting: Obstetrics and Gynecology

## 2019-06-10 DIAGNOSIS — Z1159 Encounter for screening for other viral diseases: Secondary | ICD-10-CM | POA: Insufficient documentation

## 2019-06-10 LAB — SARS CORONAVIRUS 2 (TAT 6-24 HRS): SARS Coronavirus 2: NEGATIVE

## 2019-06-12 NOTE — Anesthesia Preprocedure Evaluation (Addendum)
Anesthesia Evaluation  Patient identified by MRN, date of birth, ID band Patient awake    Reviewed: Allergy & Precautions, NPO status , Patient's Chart, lab work & pertinent test results  History of Anesthesia Complications (+) PONV  Airway Mallampati: II  TM Distance: >3 FB Neck ROM: Full    Dental  (+) Dental Advisory Given, Chipped, Caps,    Pulmonary COPD,  COPD inhaler,  06/10/2019 SARS coronavirus NEG   breath sounds clear to auscultation       Cardiovascular negative cardio ROS   Rhythm:Regular Rate:Normal     Neuro/Psych  Headaches, vertigo    GI/Hepatic Neg liver ROS, GERD  Medicated and Controlled,  Endo/Other  Raynaud's  Renal/GU negative Renal ROS     Musculoskeletal  (+) Arthritis ,   Abdominal   Peds  Hematology negative hematology ROS (+)   Anesthesia Other Findings   Reproductive/Obstetrics                         Anesthesia Physical Anesthesia Plan  ASA: II  Anesthesia Plan: General   Post-op Pain Management:    Induction: Intravenous  PONV Risk Score and Plan: 4 or greater and Scopolamine patch - Pre-op, Dexamethasone and Ondansetron  Airway Management Planned: Oral ETT  Additional Equipment:   Intra-op Plan:   Post-operative Plan: Extubation in OR  Informed Consent: I have reviewed the patients History and Physical, chart, labs and discussed the procedure including the risks, benefits and alternatives for the proposed anesthesia with the patient or authorized representative who has indicated his/her understanding and acceptance.     Dental advisory given  Plan Discussed with: CRNA and Surgeon  Anesthesia Plan Comments:       Anesthesia Quick Evaluation

## 2019-06-13 ENCOUNTER — Encounter (HOSPITAL_BASED_OUTPATIENT_CLINIC_OR_DEPARTMENT_OTHER): Payer: Self-pay

## 2019-06-13 ENCOUNTER — Encounter (HOSPITAL_BASED_OUTPATIENT_CLINIC_OR_DEPARTMENT_OTHER)
Admission: RE | Disposition: A | Discharge status: Home / Self Care | Payer: Self-pay | Source: Home / Self Care | Attending: Obstetrics and Gynecology

## 2019-06-13 ENCOUNTER — Observation Stay (HOSPITAL_BASED_OUTPATIENT_CLINIC_OR_DEPARTMENT_OTHER): Payer: BC Managed Care – PPO | Admitting: Anesthesiology

## 2019-06-13 ENCOUNTER — Observation Stay (HOSPITAL_BASED_OUTPATIENT_CLINIC_OR_DEPARTMENT_OTHER)
Admission: RE | Admit: 2019-06-13 | Discharge: 2019-06-14 | Disposition: A | Discharge status: Home / Self Care | Payer: BC Managed Care – PPO | Attending: Obstetrics and Gynecology | Admitting: Obstetrics and Gynecology

## 2019-06-13 ENCOUNTER — Observation Stay (HOSPITAL_BASED_OUTPATIENT_CLINIC_OR_DEPARTMENT_OTHER): Payer: BC Managed Care – PPO | Admitting: Physician Assistant

## 2019-06-13 DIAGNOSIS — G43909 Migraine, unspecified, not intractable, without status migrainosus: Secondary | ICD-10-CM | POA: Diagnosis not present

## 2019-06-13 DIAGNOSIS — F329 Major depressive disorder, single episode, unspecified: Secondary | ICD-10-CM | POA: Diagnosis not present

## 2019-06-13 DIAGNOSIS — D219 Benign neoplasm of connective and other soft tissue, unspecified: Secondary | ICD-10-CM | POA: Diagnosis present

## 2019-06-13 DIAGNOSIS — D259 Leiomyoma of uterus, unspecified: Principal | ICD-10-CM | POA: Insufficient documentation

## 2019-06-13 DIAGNOSIS — J449 Chronic obstructive pulmonary disease, unspecified: Secondary | ICD-10-CM | POA: Diagnosis not present

## 2019-06-13 DIAGNOSIS — K219 Gastro-esophageal reflux disease without esophagitis: Secondary | ICD-10-CM | POA: Diagnosis not present

## 2019-06-13 DIAGNOSIS — Z79899 Other long term (current) drug therapy: Secondary | ICD-10-CM | POA: Diagnosis not present

## 2019-06-13 DIAGNOSIS — I7300 Raynaud's syndrome without gangrene: Secondary | ICD-10-CM | POA: Diagnosis not present

## 2019-06-13 DIAGNOSIS — M170 Bilateral primary osteoarthritis of knee: Secondary | ICD-10-CM | POA: Diagnosis not present

## 2019-06-13 DIAGNOSIS — F419 Anxiety disorder, unspecified: Secondary | ICD-10-CM | POA: Insufficient documentation

## 2019-06-13 HISTORY — PX: HYSTERECTOMY ABDOMINAL WITH SALPINGO-OOPHORECTOMY: SHX6792

## 2019-06-13 LAB — TYPE AND SCREEN
ABO/RH(D): O POS
Antibody Screen: NEGATIVE

## 2019-06-13 SURGERY — HYSTERECTOMY, ABDOMINAL, WITH SALPINGO-OOPHORECTOMY
Anesthesia: General | Site: Abdomen | Laterality: Bilateral

## 2019-06-13 MED ORDER — MIDAZOLAM HCL 2 MG/2ML IJ SOLN
INTRAMUSCULAR | Status: AC
  Filled 2019-06-13: qty 2

## 2019-06-13 MED ORDER — FENTANYL CITRATE (PF) 100 MCG/2ML IJ SOLN
INTRAMUSCULAR | Status: DC | PRN
  Administered 2019-06-13: 50 ug via INTRAVENOUS
  Administered 2019-06-13: 150 ug via INTRAVENOUS
  Administered 2019-06-13: 50 ug via INTRAVENOUS

## 2019-06-13 MED ORDER — LACTATED RINGERS IV SOLN
INTRAVENOUS | Status: DC
  Administered 2019-06-13 (×2): via INTRAVENOUS
  Filled 2019-06-13: qty 1000

## 2019-06-13 MED ORDER — ROCURONIUM BROMIDE 10 MG/ML (PF) SYRINGE
PREFILLED_SYRINGE | INTRAVENOUS | Status: DC | PRN
  Administered 2019-06-13: 100 mg via INTRAVENOUS

## 2019-06-13 MED ORDER — ONDANSETRON HCL 4 MG PO TABS
4.0000 mg | ORAL_TABLET | Freq: Four times a day (QID) | ORAL | Status: DC | PRN
  Filled 2019-06-13: qty 1

## 2019-06-13 MED ORDER — OXYCODONE-ACETAMINOPHEN 5-325 MG PO TABS
ORAL_TABLET | ORAL | Status: AC
  Filled 2019-06-13: qty 1

## 2019-06-13 MED ORDER — DOCUSATE SODIUM 100 MG PO CAPS
ORAL_CAPSULE | ORAL | Status: AC
  Filled 2019-06-13: qty 1

## 2019-06-13 MED ORDER — DEXAMETHASONE SODIUM PHOSPHATE 10 MG/ML IJ SOLN
INTRAMUSCULAR | Status: DC | PRN
  Administered 2019-06-13: 10 mg via INTRAVENOUS

## 2019-06-13 MED ORDER — IBUPROFEN 200 MG PO TABS
ORAL_TABLET | ORAL | Status: AC
  Filled 2019-06-13: qty 3

## 2019-06-13 MED ORDER — PHENYLEPHRINE 40 MCG/ML (10ML) SYRINGE FOR IV PUSH (FOR BLOOD PRESSURE SUPPORT)
PREFILLED_SYRINGE | INTRAVENOUS | Status: AC
  Filled 2019-06-13: qty 10

## 2019-06-13 MED ORDER — SUGAMMADEX SODIUM 200 MG/2ML IV SOLN
INTRAVENOUS | Status: AC
  Filled 2019-06-13: qty 2

## 2019-06-13 MED ORDER — KETOROLAC TROMETHAMINE 30 MG/ML IJ SOLN
INTRAMUSCULAR | Status: DC | PRN
  Administered 2019-06-13: 30 mg via INTRAVENOUS

## 2019-06-13 MED ORDER — LIDOCAINE 2% (20 MG/ML) 5 ML SYRINGE
INTRAMUSCULAR | Status: DC | PRN
  Administered 2019-06-13: 40 mg via INTRAVENOUS

## 2019-06-13 MED ORDER — LACTATED RINGERS IV SOLN
INTRAVENOUS | Status: DC
  Administered 2019-06-13: 125 mL/h via INTRAVENOUS
  Filled 2019-06-13 (×2): qty 1000

## 2019-06-13 MED ORDER — LACTATED RINGERS IV SOLN
INTRAVENOUS | Status: DC
  Administered 2019-06-13: 07:00:00 via INTRAVENOUS
  Filled 2019-06-13: qty 1000

## 2019-06-13 MED ORDER — SCOPOLAMINE 1 MG/3DAYS TD PT72
1.0000 | MEDICATED_PATCH | Freq: Once | TRANSDERMAL | Status: DC
  Administered 2019-06-13: 1.5 mg via TRANSDERMAL
  Filled 2019-06-13: qty 1

## 2019-06-13 MED ORDER — PROPOFOL 10 MG/ML IV BOLUS
INTRAVENOUS | Status: AC
  Filled 2019-06-13: qty 40

## 2019-06-13 MED ORDER — ACETAMINOPHEN 500 MG PO TABS
1000.0000 mg | ORAL_TABLET | Freq: Once | ORAL | Status: AC
  Administered 2019-06-13: 1000 mg via ORAL
  Filled 2019-06-13: qty 2

## 2019-06-13 MED ORDER — SUGAMMADEX SODIUM 200 MG/2ML IV SOLN
INTRAVENOUS | Status: DC | PRN
  Administered 2019-06-13: 150 mg via INTRAVENOUS

## 2019-06-13 MED ORDER — MECLIZINE HCL 12.5 MG PO TABS
6.7500 mg | ORAL_TABLET | Freq: Three times a day (TID) | ORAL | Status: DC | PRN
  Administered 2019-06-13: 12.5 mg via ORAL
  Administered 2019-06-13: 6.25 mg via ORAL
  Filled 2019-06-13: qty 1

## 2019-06-13 MED ORDER — SCOPOLAMINE 1 MG/3DAYS TD PT72
MEDICATED_PATCH | TRANSDERMAL | Status: AC
  Filled 2019-06-13: qty 1

## 2019-06-13 MED ORDER — PROPOFOL 10 MG/ML IV BOLUS
INTRAVENOUS | Status: DC | PRN
  Administered 2019-06-13: 120 mg via INTRAVENOUS

## 2019-06-13 MED ORDER — ONDANSETRON HCL 4 MG/2ML IJ SOLN
INTRAMUSCULAR | Status: DC | PRN
  Administered 2019-06-13: 4 mg via INTRAVENOUS

## 2019-06-13 MED ORDER — MIDAZOLAM HCL 2 MG/2ML IJ SOLN
INTRAMUSCULAR | Status: DC | PRN
  Administered 2019-06-13: 2 mg via INTRAVENOUS

## 2019-06-13 MED ORDER — SODIUM CHLORIDE 0.9 % IV SOLN
2.0000 g | INTRAVENOUS | Status: AC
  Administered 2019-06-13: 2 g via INTRAVENOUS
  Filled 2019-06-13: qty 2

## 2019-06-13 MED ORDER — HYDROMORPHONE HCL 1 MG/ML IJ SOLN
0.2500 mg | INTRAMUSCULAR | Status: DC | PRN
  Administered 2019-06-13 (×2): 0.5 mg via INTRAVENOUS
  Filled 2019-06-13: qty 0.5

## 2019-06-13 MED ORDER — PHENYLEPHRINE HCL (PRESSORS) 10 MG/ML IV SOLN
INTRAVENOUS | Status: DC | PRN
  Administered 2019-06-13: 80 ug via INTRAVENOUS
  Administered 2019-06-13 (×2): 120 ug via INTRAVENOUS
  Administered 2019-06-13: 80 ug via INTRAVENOUS

## 2019-06-13 MED ORDER — IBUPROFEN 600 MG PO TABS
600.0000 mg | ORAL_TABLET | Freq: Four times a day (QID) | ORAL | Status: DC | PRN
  Administered 2019-06-13 – 2019-06-14 (×3): 600 mg via ORAL
  Filled 2019-06-13: qty 1

## 2019-06-13 MED ORDER — HYDROMORPHONE HCL 1 MG/ML IJ SOLN
INTRAMUSCULAR | Status: AC
  Filled 2019-06-13: qty 1

## 2019-06-13 MED ORDER — DEXAMETHASONE SODIUM PHOSPHATE 10 MG/ML IJ SOLN
INTRAMUSCULAR | Status: AC
  Filled 2019-06-13: qty 1

## 2019-06-13 MED ORDER — KETOROLAC TROMETHAMINE 30 MG/ML IJ SOLN
INTRAMUSCULAR | Status: AC
  Filled 2019-06-13: qty 1

## 2019-06-13 MED ORDER — 0.9 % SODIUM CHLORIDE (POUR BTL) OPTIME
TOPICAL | Status: DC | PRN
  Administered 2019-06-13: 3000 mL

## 2019-06-13 MED ORDER — MENTHOL 3 MG MT LOZG
1.0000 | LOZENGE | OROMUCOSAL | Status: DC | PRN
  Filled 2019-06-13: qty 9

## 2019-06-13 MED ORDER — EPHEDRINE SULFATE 50 MG/ML IJ SOLN
INTRAMUSCULAR | Status: DC | PRN
  Administered 2019-06-13 (×2): 10 mg via INTRAVENOUS

## 2019-06-13 MED ORDER — ONDANSETRON HCL 4 MG/2ML IJ SOLN
INTRAMUSCULAR | Status: AC
  Filled 2019-06-13: qty 2

## 2019-06-13 MED ORDER — MEPERIDINE HCL 25 MG/ML IJ SOLN
6.2500 mg | INTRAMUSCULAR | Status: DC | PRN
  Filled 2019-06-13: qty 1

## 2019-06-13 MED ORDER — FLUOXETINE HCL 20 MG PO CAPS
20.0000 mg | ORAL_CAPSULE | Freq: Every day | ORAL | Status: DC
  Filled 2019-06-13: qty 1

## 2019-06-13 MED ORDER — FAMOTIDINE 20 MG PO TABS
20.0000 mg | ORAL_TABLET | Freq: Two times a day (BID) | ORAL | Status: DC
  Administered 2019-06-13: 20 mg via ORAL
  Filled 2019-06-13: qty 1

## 2019-06-13 MED ORDER — FLUTICASONE PROPIONATE 50 MCG/ACT NA SUSP
2.0000 | Freq: Every day | NASAL | Status: DC
  Filled 2019-06-13: qty 16

## 2019-06-13 MED ORDER — EPHEDRINE 5 MG/ML INJ
INTRAVENOUS | Status: AC
  Filled 2019-06-13: qty 10

## 2019-06-13 MED ORDER — FENTANYL CITRATE (PF) 250 MCG/5ML IJ SOLN
INTRAMUSCULAR | Status: AC
  Filled 2019-06-13: qty 5

## 2019-06-13 MED ORDER — PROMETHAZINE HCL 25 MG/ML IJ SOLN
6.2500 mg | INTRAMUSCULAR | Status: DC | PRN
  Filled 2019-06-13: qty 1

## 2019-06-13 MED ORDER — MIDAZOLAM HCL 2 MG/2ML IJ SOLN
0.5000 mg | Freq: Once | INTRAMUSCULAR | Status: DC | PRN
  Filled 2019-06-13: qty 2

## 2019-06-13 MED ORDER — ROCURONIUM BROMIDE 10 MG/ML (PF) SYRINGE
PREFILLED_SYRINGE | INTRAVENOUS | Status: AC
  Filled 2019-06-13: qty 10

## 2019-06-13 MED ORDER — DOCUSATE SODIUM 100 MG PO CAPS
100.0000 mg | ORAL_CAPSULE | Freq: Two times a day (BID) | ORAL | Status: DC
  Administered 2019-06-13 (×2): 100 mg via ORAL
  Filled 2019-06-13: qty 1

## 2019-06-13 MED ORDER — SODIUM CHLORIDE 0.9 % IV SOLN
INTRAVENOUS | Status: AC
  Filled 2019-06-13: qty 2

## 2019-06-13 MED ORDER — OXYCODONE-ACETAMINOPHEN 5-325 MG PO TABS
1.0000 | ORAL_TABLET | ORAL | Status: DC | PRN
  Administered 2019-06-13 – 2019-06-14 (×5): 1 via ORAL
  Filled 2019-06-13: qty 2

## 2019-06-13 MED ORDER — ARTIFICIAL TEARS OPHTHALMIC OINT
TOPICAL_OINTMENT | OPHTHALMIC | Status: AC
  Filled 2019-06-13: qty 3.5

## 2019-06-13 MED ORDER — MONTELUKAST SODIUM 10 MG PO TABS
10.0000 mg | ORAL_TABLET | Freq: Every day | ORAL | Status: DC
  Administered 2019-06-13: 10 mg via ORAL
  Filled 2019-06-13: qty 1

## 2019-06-13 MED ORDER — SUCCINYLCHOLINE CHLORIDE 200 MG/10ML IV SOSY
PREFILLED_SYRINGE | INTRAVENOUS | Status: AC
  Filled 2019-06-13: qty 10

## 2019-06-13 MED ORDER — ONDANSETRON HCL 4 MG/2ML IJ SOLN
4.0000 mg | Freq: Four times a day (QID) | INTRAMUSCULAR | Status: DC | PRN
  Filled 2019-06-13: qty 2

## 2019-06-13 MED ORDER — ACETAMINOPHEN 500 MG PO TABS
ORAL_TABLET | ORAL | Status: AC
  Filled 2019-06-13: qty 2

## 2019-06-13 MED ORDER — HYDROMORPHONE HCL 1 MG/ML IJ SOLN
0.2000 mg | INTRAMUSCULAR | Status: DC | PRN
  Administered 2019-06-13: 0.5 mg via INTRAVENOUS
  Filled 2019-06-13: qty 1

## 2019-06-13 MED ORDER — LIDOCAINE 2% (20 MG/ML) 5 ML SYRINGE
INTRAMUSCULAR | Status: AC
  Filled 2019-06-13: qty 5

## 2019-06-13 MED ORDER — SIMETHICONE 80 MG PO CHEW
80.0000 mg | CHEWABLE_TABLET | Freq: Four times a day (QID) | ORAL | Status: DC | PRN
  Filled 2019-06-13: qty 1

## 2019-06-13 SURGICAL SUPPLY — 37 items
CANISTER SUCT 3000ML PPV (MISCELLANEOUS) ×2 IMPLANT
CONT PATH 16OZ SNAP LID 3702 (MISCELLANEOUS) ×2 IMPLANT
DERMABOND ADVANCED (GAUZE/BANDAGES/DRESSINGS) ×1
DERMABOND ADVANCED .7 DNX12 (GAUZE/BANDAGES/DRESSINGS) IMPLANT
DRAPE LAPAROTOMY T 102X78X121 (DRAPES) ×2 IMPLANT
DRAPE WARM FLUID 44X44 (DRAPES) IMPLANT
DRSG OPSITE POSTOP 4X10 (GAUZE/BANDAGES/DRESSINGS) ×2 IMPLANT
DURAPREP 26ML APPLICATOR (WOUND CARE) ×2 IMPLANT
GAUZE 4X4 16PLY RFD (DISPOSABLE) ×1 IMPLANT
GLOVE BIO SURGEON STRL SZ 6.5 (GLOVE) ×2 IMPLANT
GLOVE BIOGEL PI IND STRL 7.0 (GLOVE) ×3 IMPLANT
GLOVE BIOGEL PI INDICATOR 7.0 (GLOVE) ×3
GOWN STRL REUS W/ TWL LRG LVL3 (GOWN DISPOSABLE) ×3 IMPLANT
GOWN STRL REUS W/TWL LRG LVL3 (GOWN DISPOSABLE) ×3
HIBICLENS CHG 4% 4OZ BTL (MISCELLANEOUS) ×1 IMPLANT
NS IRRIG 1000ML POUR BTL (IV SOLUTION) ×4 IMPLANT
PACK ABDOMINAL GYN (CUSTOM PROCEDURE TRAY) ×2 IMPLANT
PAD OB MATERNITY 4.3X12.25 (PERSONAL CARE ITEMS) ×2 IMPLANT
PROTECTOR NERVE ULNAR (MISCELLANEOUS) ×4 IMPLANT
SPONGE LAP 18X18 RF (DISPOSABLE) ×2 IMPLANT
SUT MNCRL 0 MO-4 VIOLET 18 CR (SUTURE) ×2 IMPLANT
SUT MNCRL 0 VIOLET 6X18 (SUTURE) ×1 IMPLANT
SUT MON AB-0 CT1 36 (SUTURE) ×2 IMPLANT
SUT MONOCRYL 0 6X18 (SUTURE) ×1
SUT MONOCRYL 0 MO 4 18  CR/8 (SUTURE) ×2
SUT PDS AB 0 CTX 60 (SUTURE) ×2 IMPLANT
SUT PLAIN 2 0 XLH (SUTURE) IMPLANT
SUT SILK 0 TIES 10X30 (SUTURE) ×1 IMPLANT
SUT SILK 2 0 (SUTURE) ×1
SUT SILK 2-0 18XBRD TIE 12 (SUTURE) IMPLANT
SUT VIC AB 0 CT1 27 (SUTURE) ×1
SUT VIC AB 0 CT1 27XCR 8 STRN (SUTURE) IMPLANT
SUT VIC AB 3-0 X1 27 (SUTURE) IMPLANT
SUT VIC AB 4-0 KS 27 (SUTURE) ×2 IMPLANT
SYR BULB IRRIGATION 50ML (SYRINGE) ×1 IMPLANT
TOWEL OR 17X26 10 PK STRL BLUE (TOWEL DISPOSABLE) ×3 IMPLANT
TRAY FOLEY W/BAG SLVR 14FR (SET/KITS/TRAYS/PACK) ×2 IMPLANT

## 2019-06-13 NOTE — Anesthesia Procedure Notes (Signed)
Procedure Name: Intubation Date/Time: 06/13/2019 7:38 AM Performed by: Wanita Chamberlain, CRNA Pre-anesthesia Checklist: Patient identified, Emergency Drugs available, Suction available, Patient being monitored and Timeout performed Patient Re-evaluated:Patient Re-evaluated prior to induction Oxygen Delivery Method: Circle system utilized Preoxygenation: Pre-oxygenation with 100% oxygen Induction Type: IV induction Ventilation: Mask ventilation without difficulty Laryngoscope Size: Mac and 3 Grade View: Grade I Tube type: Oral Tube size: 7.0 mm Number of attempts: 1 Airway Equipment and Method: Bite block Placement Confirmation: ETT inserted through vocal cords under direct vision,  positive ETCO2,  CO2 detector and breath sounds checked- equal and bilateral Secured at: 20 cm Tube secured with: Tape Dental Injury: Teeth and Oropharynx as per pre-operative assessment

## 2019-06-13 NOTE — Transfer of Care (Signed)
Immediate Anesthesia Transfer of Care Note  Patient: Crystal Dillon  Procedure(s) Performed: HYSTERECTOMY ABDOMINAL WITH SALPINGO-OOPHORECTOMY (Bilateral Abdomen)  Patient Location: PACU  Anesthesia Type:General  Level of Consciousness: awake, alert , oriented and patient cooperative  Airway & Oxygen Therapy: Patient Spontanous Breathing and Patient connected to nasal cannula oxygen  Post-op Assessment: Report given to RN and Post -op Vital signs reviewed and stable  Post vital signs: Reviewed and stable  Last Vitals:  Vitals Value Taken Time  BP    Temp    Pulse 94 06/13/19 0858  Resp    SpO2 97 % 06/13/19 0858  Vitals shown include unvalidated device data.  Last Pain:  Vitals:   06/13/19 0543  TempSrc: Oral  PainSc: 0-No pain      Patients Stated Pain Goal: 3 (46/80/32 1224)  Complications: No apparent anesthesia complications

## 2019-06-13 NOTE — Progress Notes (Signed)
Day of Surgery Procedure(s) (LRB): HYSTERECTOMY ABDOMINAL WITH SALPINGO-OOPHORECTOMY (Bilateral)  Subjective: Patient reports incisional pain and tolerating PO.    Objective: I have reviewed patient's vital signs, intake and output and labs.  General: alert and cooperative GI: normal findings: soft, non-tender  Assessment: s/p Procedure(s): HYSTERECTOMY ABDOMINAL WITH SALPINGO-OOPHORECTOMY (Bilateral): stable  Plan: Advance diet Encourage ambulation Advance to PO medication  LOS: 0 days    Crystal Dillon 06/13/2019, 1:23 PM

## 2019-06-13 NOTE — Op Note (Signed)
Hysterectomy Procedure Note  Indications: fibroids  Pre-operative Diagnosis: fibroids  Post-operative Diagnosis: fibroids  Operation: Total abdominal hysterectomy, bilateral salpingo-oophorectomy  Surgeon: Marylynn Pearson , MD  Assistants: Jerilynn Mages. Helane Rima, MD  Anesthesia: General endotracheal anesthesia   Procedure Details  The patient was seen in the Holding Room. The risks, benefits, complications, treatment options, and expected outcomes were discussed with the patient.  The patient concurred with the proposed plan, giving informed consent.  The site of surgery properly noted/marked. The patient was taken to Operating Room # 1, identified as Crystal Dillon and the procedure verified as Total abdominal hysterectomy, bilateral salpingo-oophorectomy. A Time Out was held and the above information confirmed.  After induction of anesthesia, the patient was draped and prepped in the usual sterile manner. Pt was placed in supine position after anesthesia and draped and prepped in the usual sterile manner. Foley catheter was placed.  A transverse incision was made and carried through the subcutaneous tissue to the fascia. Fascial incision was made and extended laterally. The rectus muscles were separated. The peritoneum was identified and entered. Peritoneal incision was extended longitudinally.  O'connor retractor was placed and bowel was packed away from the surgical site.   The round ligaments were identified, cut, and ligated with 0-Vicryl. The anterior peritoneal reflection was incised and the bladder was dissected off the lower uterine segment. The retroperitoneal space was explored and the ureters were identified bilaterally. The right infundibulo-pelvic ligament was grasped, cut, and suture ligated with 0-monocryl. The left infundibulo-pelvic ligament was grasped, cut and suture ligated with 0-monocryl. Hemostasis  was observed. The uterine vessels were skeletonized, then clamped, cut and  suture ligated with 0-Vicryl suture. Serial pedicles of the cardinal and utero-sacral ligaments were clamped, cut, and suture ligated with 0-monocryl. Entrance was made into the vagina and the uterus removed. Vaginal cuff angle sutures were placed incorporating the utero-sacral ligaments for support. The vaginal cuff was then closed with a running stitch of 0- monocryl. Lavage was carried out until clear. Hemostasis was observed.  Retractor and all packing was removed from the abdomen. The fascia was approximated with running sutures of 0-PDS. Lavage was again carried out. Hemostasis was observed. The skin was approximated with staples.  Instrument, sponge, and needle counts were correct prior to abdominal closure and at the conclusion of the case.     Estimated Blood Loss:  less than 50 mL                    Specimens: uterus, bilateral tubes and ovaries                Complications:  None; patient tolerated the procedure well.         Disposition: PACU - hemodynamically stable.         Condition: stable  Attending Attestation: I was present and scrubbed for the entire procedure.

## 2019-06-13 NOTE — Anesthesia Postprocedure Evaluation (Signed)
Anesthesia Post Note  Patient: Dashonna Chagnon  Procedure(s) Performed: HYSTERECTOMY ABDOMINAL WITH SALPINGO-OOPHORECTOMY (Bilateral Abdomen)     Patient location during evaluation: PACU Anesthesia Type: General Level of consciousness: awake and alert, patient cooperative and oriented Pain management: pain level controlled Vital Signs Assessment: post-procedure vital signs reviewed and stable Respiratory status: spontaneous breathing, nonlabored ventilation and respiratory function stable Cardiovascular status: blood pressure returned to baseline and stable Postop Assessment: no apparent nausea or vomiting Anesthetic complications: no    Last Vitals:  Vitals:   06/13/19 1000 06/13/19 1030  BP: 113/79 106/61  Pulse: (!) 108 (!) 101  Resp: 16 16  Temp: (!) 36.2 C (!) 36.1 C  SpO2: 95% 94%    Last Pain:  Vitals:   06/13/19 1030  TempSrc:   PainSc: 4                  Yazmina Pareja,E. Naveed Humphres

## 2019-06-14 DIAGNOSIS — F329 Major depressive disorder, single episode, unspecified: Secondary | ICD-10-CM | POA: Diagnosis not present

## 2019-06-14 DIAGNOSIS — D259 Leiomyoma of uterus, unspecified: Secondary | ICD-10-CM | POA: Diagnosis not present

## 2019-06-14 DIAGNOSIS — I7300 Raynaud's syndrome without gangrene: Secondary | ICD-10-CM | POA: Diagnosis not present

## 2019-06-14 DIAGNOSIS — J449 Chronic obstructive pulmonary disease, unspecified: Secondary | ICD-10-CM | POA: Diagnosis not present

## 2019-06-14 DIAGNOSIS — K219 Gastro-esophageal reflux disease without esophagitis: Secondary | ICD-10-CM | POA: Diagnosis not present

## 2019-06-14 DIAGNOSIS — F419 Anxiety disorder, unspecified: Secondary | ICD-10-CM | POA: Diagnosis not present

## 2019-06-14 DIAGNOSIS — Z79899 Other long term (current) drug therapy: Secondary | ICD-10-CM | POA: Diagnosis not present

## 2019-06-14 MED ORDER — IBUPROFEN 200 MG PO TABS
ORAL_TABLET | ORAL | Status: AC
  Filled 2019-06-14: qty 3

## 2019-06-14 MED ORDER — OXYCODONE-ACETAMINOPHEN 5-325 MG PO TABS
ORAL_TABLET | ORAL | Status: AC
  Filled 2019-06-14: qty 1

## 2019-06-14 MED ORDER — OXYCODONE-ACETAMINOPHEN 5-325 MG PO TABS: 1.0000 | ORAL_TABLET | ORAL | 0 refills | Status: DC | PRN

## 2019-06-14 MED ORDER — IBUPROFEN 600 MG PO TABS: 600.0000 mg | ORAL_TABLET | Freq: Four times a day (QID) | ORAL | 0 refills | Status: DC | PRN

## 2019-06-14 NOTE — Discharge Summary (Signed)
Physician Discharge Summary  Patient ID: Crystal Dillon MRN: [id number] DOB/AGE: [birth year] 53 y.o.  Admit date: 06/13/2019 Discharge date: 06/14/2019  Admission Diagnoses: fibroids  Discharge Diagnoses:  Active Problems:   Fibroids   Discharged Condition: good  Hospital Course: Pt was admitted for postoperative care.  Initially, her pain was controlled with IV pain meds.  Once she was able to tolerate oral intake, she was given PO meds.  POD#1, she was able to ambulate and void without difficulty.  Her pain was controlled and she was tolerating a regular diet.    Consults: None  Significant Diagnostic Studies: labs: cbc  Treatments: surgery: TAH/BSO  Discharge Exam: Blood pressure 104/72, pulse 68, temperature 98.3 F (36.8 C), resp. rate 16, height 5\' 1"  (1.549 m), weight 55.8 kg, last menstrual period 07/06/2011, SpO2 98 %. General appearance: alert and cooperative GI: normal findings: soft, non-tender  Disposition:   Discharge Instructions    Call MD for:   Complete by: As directed    Call MD for:  persistant nausea and vomiting   Complete by: As directed    Call MD for:  severe uncontrolled pain   Complete by: As directed    Call MD for:  temperature >100.4   Complete by: As directed    Diet - low sodium heart healthy   Complete by: As directed    Increase activity slowly   Complete by: As directed      Allergies as of 06/14/2019   No Known Allergies     Medication List    STOP taking these medications   [name] 0.35 MG tablet Generic drug: norethindrone     TAKE these medications   eletriptan 40 MG tablet Commonly known as: Relpax TAKE 1 TABLET BY MOUTH AT ONSET OF HEADACHE MAY REPEAT IN 2 HOURS IF PERSISTS OR REOCCURS   famotidine 20 MG tablet Commonly known as: Pepcid Take 1 tablet (20 mg total) by mouth 2 (two) times daily.   fexofenadine 180 MG tablet Commonly known as: ALLEGRA Take 180 mg by mouth daily.   FLUoxetine 20 MG  capsule Commonly known as: PROZAC Take 1 capsule (20 mg total) by mouth daily.   fluticasone 50 MCG/ACT nasal spray Commonly known as: FLONASE Place 2 sprays into both nostrils daily.   ibuprofen 600 MG tablet Commonly known as: ADVIL Take 1 tablet (600 mg total) by mouth every 6 (six) hours as needed for mild pain. Notes to patient: Nest dose is due at 10 am as needed for pain    meclizine 12.5 MG tablet Commonly known as: ANTIVERT Take 0.5-1 tablets (6.25-12.5 mg total) by mouth 3 (three) times daily as needed for dizziness.   montelukast 10 MG tablet Commonly known as: SINGULAIR Take 1 tablet (10 mg total) by mouth at bedtime.   ondansetron 4 MG tablet Commonly known as: ZOFRAN Take 1 tablet (4 mg total) by mouth every 8 (eight) hours as needed for nausea or vomiting (migraines).   oxyCODONE-acetaminophen 5-325 MG tablet Commonly known as: PERCOCET/ROXICET Take 1-2 tablets by mouth every 4 (four) hours as needed for moderate pain. Notes to patient: Nest dose is due at 10 am as needed for pain   triamcinolone cream 0.1 % Commonly known as: KENALOG Apply 1 application topically 2 (two) times daily. What changed:   when to take this  reasons to take this      Follow-up Information    Marylynn Pearson, MD. Schedule an appointment as soon as possible for a visit  in 2 week(s).   Specialty: Obstetrics and Gynecology Contact information: [place], [place] [address] [phone]           Signed: Marylynn Pearson 06/14/2019, 8:05 PM

## 2019-06-17 ENCOUNTER — Encounter (HOSPITAL_BASED_OUTPATIENT_CLINIC_OR_DEPARTMENT_OTHER): Payer: Self-pay | Admitting: Obstetrics and Gynecology

## 2019-06-25 ENCOUNTER — Other Ambulatory Visit: Payer: Self-pay

## 2019-06-25 ENCOUNTER — Emergency Department (HOSPITAL_COMMUNITY)
Admission: EM | Admit: 2019-06-25 | Discharge: 2019-06-25 | Discharge status: Absent without leave | Payer: BC Managed Care – PPO

## 2019-06-26 ENCOUNTER — Ambulatory Visit: Payer: Self-pay

## 2019-06-26 ENCOUNTER — Encounter: Payer: Self-pay | Admitting: Family Medicine

## 2019-06-26 ENCOUNTER — Other Ambulatory Visit (INDEPENDENT_AMBULATORY_CARE_PROVIDER_SITE_OTHER): Payer: BC Managed Care – PPO

## 2019-06-26 ENCOUNTER — Other Ambulatory Visit: Payer: BC Managed Care – PPO

## 2019-06-26 ENCOUNTER — Ambulatory Visit (INDEPENDENT_AMBULATORY_CARE_PROVIDER_SITE_OTHER): Payer: BC Managed Care – PPO | Admitting: Family Medicine

## 2019-06-26 DIAGNOSIS — R202 Paresthesia of skin: Secondary | ICD-10-CM | POA: Diagnosis not present

## 2019-06-26 DIAGNOSIS — M5136 Other intervertebral disc degeneration, lumbar region: Secondary | ICD-10-CM | POA: Diagnosis not present

## 2019-06-26 LAB — BASIC METABOLIC PANEL
BUN: 12 mg/dL (ref 6–23)
CO2: 25 mEq/L (ref 19–32)
Calcium: 9.4 mg/dL (ref 8.4–10.5)
Chloride: 102 mEq/L (ref 96–112)
Creatinine, Ser: 0.72 mg/dL (ref 0.40–1.20)
GFR: 84 mL/min (ref >60.00)
Glucose, Bld: 78 mg/dL (ref 70–99)
Potassium: 4.4 mEq/L (ref 3.5–5.1)
Sodium: 138 mEq/L (ref 135–145)

## 2019-06-26 LAB — TSH: TSH: 1.32 u[IU]/mL (ref 0.35–4.50)

## 2019-06-26 LAB — VITAMIN B12: Vitamin B-12: 235 pg/mL (ref 211–911)

## 2019-06-26 NOTE — Progress Notes (Signed)
Virtual Visit via Video Note   I connected with Crystal Dillon on 06/26/19 at 10:15 AM EDT by a video enabled telemedicine application and verified that I am speaking with the correct person using two identifiers.  Location patient: home Location provider:home office Persons participating in the virtual visit: patient, provider  I discussed the limitations of evaluation and management by telemedicine and the availability of in person appointments. The patient expressed understanding and agreed to proceed.   HPI: Crystal Dillon is a 53 yo female with Hx of anxiety,lumbar DDD,and Raynaud synd c/o 4 days of tingling LLE since 6/5th. Noted a week after surgery,total hysterectomy on 06/13/19. Exacerbated by LE elevation, alleviated by walking.  Denies prior Hx.  Denies fever,chills, leg pain, or LE edema/erythema. Tingling below knee (calf and foot),intermittent,has had 2 episodes, it lasts a few hours (Sunday and last night).  No associated CP,dyspnea,palpitation,abdominal pain,N/V,changes in bowel habits, dysuria,gross hematuria,back pain,saddle anesthesia,urine/bowel incontinence,or focal weakness.  She has not noted cold extremity of cyanosis. Problem seems to be stable. She has not tried OTC medication.   ROS: See pertinent positives and negatives per HPI.  Past Medical History:  Diagnosis Date  . Anemia   . Anxiety   . Chronic urticaria 11/23/2018  . Complication of anesthesia    Blood pressure drops low  . Environmental and seasonal allergies   . GERD (gastroesophageal reflux disease)   . Migraine   . OA (osteoarthritis)   . PONV (postoperative nausea and vomiting)   . RAYNAUDS SYNDROME 01/06/2011   Qualifier: Diagnosis of  By: Arnoldo Morale MD, Balinda Quails   . Uterine fibroid    diagnosed by OB/GYN Dr Mayra Neer  . Varicose veins of left lower extremity with both ulcer of ankle and inflammation ([place]) 07/07/2011   Referral to interventional radiology for vascular study and potential  intervention   . Vertigo     Past Surgical History:  Procedure Laterality Date  . ADENOIDECTOMY    . CESAREAN SECTION    . DENTAL SURGERY    . HYSTERECTOMY ABDOMINAL WITH SALPINGO-OOPHORECTOMY Bilateral 06/13/2019   Procedure: HYSTERECTOMY ABDOMINAL WITH SALPINGO-OOPHORECTOMY;  Surgeon: Marylynn Pearson, MD;  Location: [facility];  Service: Gynecology;  Laterality: Bilateral;  . HYSTEROSCOPY    . TONSILLECTOMY    . TUBAL LIGATION      Family History  Problem Relation Age of Onset  . COPD Mother   . Hyperlipidemia Mother   . Diabetes Mother   . Cancer Father        lung, smoker  . Diabetes Father   . Stroke Father   . Colon cancer Maternal Grandmother   . Diabetes Maternal Grandmother   . Healthy Sister   . Heart Problems Brother   . Crohn's disease Son   . Ulcerative colitis Daughter     Social History   Socioeconomic History  . Marital status: Married    Spouse name: Not on file  . Number of children: Not on file  . Years of education: Not on file  . Highest education level: Not on file  Occupational History  . Occupation: [employer]    Employer: [employer]  . Occupation: Occupational psychologist  Social Needs  . Financial resource strain: Not on file  . Food insecurity    Worry: Not on file    Inability: Not on file  . Transportation needs    Medical: Not on file    Non-medical: Not on file  Tobacco Use  . Smoking status: Never Smoker  .  Smokeless tobacco: Never Used  Substance and Sexual Activity  . Alcohol use: Yes    Comment: rare  . Drug use: No  . Sexual activity: Not on file  Lifestyle  . Physical activity    Days per week: Not on file    Minutes per session: Not on file  . Stress: Not on file  Relationships  . Social Herbalist on phone: Not on file    Gets together: Not on file    Attends religious service: Not on file    Active member of club or organization: Not on file    Attends meetings of clubs or  organizations: Not on file    Relationship status: Not on file  . Intimate partner violence    Fear of current or ex partner: Not on file    Emotionally abused: Not on file    Physically abused: Not on file    Forced sexual activity: Not on file  Other Topics Concern  . Not on file  Social History Narrative   Work or School: acounting       Home Situation: lives with husband and son 81 yo, and niece whom is 17 yo      Spiritual Beliefs: Christian      Lifestyle:trying to eat healthy; no regular exercise      Current Outpatient Medications:  .  eletriptan (RELPAX) 40 MG tablet, TAKE 1 TABLET BY MOUTH AT ONSET OF HEADACHE MAY REPEAT IN 2 HOURS IF PERSISTS OR REOCCURS, Disp: 9 tablet, Rfl: 5 .  famotidine (PEPCID) 20 MG tablet, Take 1 tablet (20 mg total) by mouth 2 (two) times daily., Disp: 180 tablet, Rfl: 1 .  fexofenadine (ALLEGRA) 180 MG tablet, Take 180 mg by mouth daily., Disp: , Rfl:  .  FLUoxetine (PROZAC) 20 MG capsule, Take 1 capsule (20 mg total) by mouth daily., Disp: 90 capsule, Rfl: 0 .  fluticasone (FLONASE) 50 MCG/ACT nasal spray, Place 2 sprays into both nostrils daily., Disp: 16 g, Rfl: 6 .  ibuprofen (ADVIL) 600 MG tablet, Take 1 tablet (600 mg total) by mouth every 6 (six) hours as needed for mild pain., Disp: 30 tablet, Rfl: 0 .  meclizine (ANTIVERT) 12.5 MG tablet, Take 0.5-1 tablets (6.25-12.5 mg total) by mouth 3 (three) times daily as needed for dizziness., Disp: 30 tablet, Rfl: 2 .  montelukast (SINGULAIR) 10 MG tablet, Take 1 tablet (10 mg total) by mouth at bedtime., Disp: 90 tablet, Rfl: 1 .  ondansetron (ZOFRAN) 4 MG tablet, Take 1 tablet (4 mg total) by mouth every 8 (eight) hours as needed for nausea or vomiting (migraines)., Disp: 20 tablet, Rfl: 0 .  oxyCODONE-acetaminophen (PERCOCET/ROXICET) 5-325 MG tablet, Take 1-2 tablets by mouth every 4 (four) hours as needed for moderate pain., Disp: 30 tablet, Rfl: 0 .  triamcinolone cream (KENALOG) 0.1 %, Apply  1 application topically 2 (two) times daily. (Patient taking differently: Apply 1 application topically 2 (two) times daily as needed (irritation). ), Disp: 30 g, Rfl: 0  EXAM:  VITALS per patient if applicable:N/A  GENERAL: alert, oriented, appears well and in no acute distress  HEENT: atraumatic, normocephalic,conjunctiva clear, no obvious facial abnormalities on inspection.  LUNGS: on inspection no signs of respiratory distress, breathing rate appears normal, no obvious gross SOB, gasping or wheezing  CV: no obvious cyanosis  Crystal: moves all visible extremities without noticeable abnormality  PSYCH/NEURO: pleasant and cooperative, no obvious depression or anxiety, speech and thought processing grossly  intact  ASSESSMENT AND PLAN:  Discussed the following assessment and plan:  Lab Results  Component Value Date   CREATININE 0.72 06/26/2019   BUN 12 06/26/2019   NA 138 06/26/2019   K 4.4 06/26/2019   CL 102 06/26/2019   CO2 25 06/26/2019   Lab Results  Component Value Date   TSH 1.32 06/26/2019   Lab Results  Component Value Date   VITAMINB12 235 06/26/2019    Tingling sensation - Plan: Vitamin B12, TSH, Basic metabolic panel Affecting LLE. We discussed possible etiologies. She does not understand how to find DP pulse. Pressed tip toes of affected foot and normal capillary refill.  Lab appt will be arranged. Clearly instructed about warning signs.  DDD (degenerative disc disease), lumbar - Plan: ? Radicular pain. She is not interested in course of prednisone. For now we agreed holding on lumbar imaging.   I discussed the assessment and treatment plan with the patient. She was provided an opportunity to ask questions and all were answered. She agreed with the plan and demonstrated an understanding of the instructions.   The patient was advised to call back or seek an in-person evaluation if the symptoms worsen or if the condition fails to improve as  anticipated.  Return if symptoms worsen or fail to improve.    Crystal Dillon [place], MD

## 2019-06-26 NOTE — Telephone Encounter (Signed)
Pt. C/o onset of intermittent tingling in the left leg that started on Sunday evening.  Reported it occurs when she is elevating her leg.  Stated she reported this to her OB/GYN, due to recent abdominal hysterectomy on 6/25.  Was advised to go to the ER.  Stated there were so many patients ahead of her, she decided not to stay at the ER.  Reported the tingling started again last evening.  Reported that this also occurred when her leg was elevated, and improved with putting her leg down.  Denied any pain or swelling of the left leg.  Denied any change in color or temp. of left leg.  Able to move her left foot and leg without difficulty.  Denied any numbness/ tingling of left upper extremity.  Denied change in speech, temp. Loss of vision, facial droop, or weakness of extremities.  Stated she has had an intermittent dry cough since Friday, and has been using an incentive spirometer. Denied fever/ chills.  Called office and transferred pt. To [facility] to have an appt. Scheduled.  Pt. Agreed with plan.      Reason for Disposition . [1] Tingling (e.g., pins and needles) of the face, arm / hand, or leg / foot on one side of the body AND [2] present now  Answer Assessment - Initial Assessment Questions 1. SYMPTOM: "What is the main symptom you are concerned about?" (e.g., weakness, numbness)     Numbness and tingling of left lower leg and foot 2. ONSET: "When did this start?" (minutes, hours, days; while sleeping)    It started on Sunday at bedtime 3. LAST NORMAL: "When was the last time you were normal (no symptoms)?"     Sunday morning and afternoon  4. PATTERN "Does this come and go, or has it been constant since it started?"  "Is it present now?"     Intermittent  5. CARDIAC SYMPTOMS: "Have you had any of the following symptoms: chest pain, difficulty breathing, palpitations?"     Denied chest pain or shortness of breath  6. NEUROLOGIC SYMPTOMS: "Have you had any of the following symptoms: headache,  dizziness, vision loss, double vision, changes in speech, unsteady on your feet?"    Denied temp. vision loss, weakness of extremities, speech difficulty, facial droop; admits to vertigo and intermittent headaches; c/o intermittent numbness in left foot and lower leg with elevation; denied swelling of left leg; denied discoloration, denied temp. change   7. OTHER SYMPTOMS: "Do you have any other symptoms?"     Developed a cough on Friday; intermittent and dry; no fever or chills.   8. PREGNANCY: "Is there any chance you are pregnant?" "When was your last menstrual period?"     N/a  Protocols used: NEUROLOGIC DEFICIT-A-AH

## 2019-07-15 ENCOUNTER — Other Ambulatory Visit: Payer: Self-pay | Admitting: Family Medicine

## 2019-07-17 ENCOUNTER — Encounter: Payer: Self-pay | Admitting: Family Medicine

## 2019-07-17 MED ORDER — FLUOXETINE HCL 20 MG PO CAPS: 20.0000 mg | ORAL_CAPSULE | Freq: Every day | ORAL | 1 refills | Status: DC

## 2019-07-22 ENCOUNTER — Encounter: Payer: Self-pay | Admitting: Family Medicine

## 2019-07-22 DIAGNOSIS — H8110 Benign paroxysmal vertigo, unspecified ear: Secondary | ICD-10-CM

## 2019-07-22 NOTE — Telephone Encounter (Signed)
If she desires we can send to rehab for this. I'm sorry she isn't feeling better! Usually they are able to work this out in rehab.

## 2019-07-29 ENCOUNTER — Other Ambulatory Visit: Payer: Self-pay | Admitting: Family Medicine

## 2019-08-07 ENCOUNTER — Other Ambulatory Visit: Payer: Self-pay

## 2019-08-07 ENCOUNTER — Ambulatory Visit: Payer: BC Managed Care – PPO | Attending: Family Medicine | Admitting: Physical Therapy

## 2019-08-07 ENCOUNTER — Encounter: Payer: Self-pay | Admitting: Physical Therapy

## 2019-08-07 DIAGNOSIS — M6281 Muscle weakness (generalized): Secondary | ICD-10-CM | POA: Diagnosis not present

## 2019-08-07 DIAGNOSIS — R42 Dizziness and giddiness: Secondary | ICD-10-CM | POA: Insufficient documentation

## 2019-08-07 DIAGNOSIS — H8111 Benign paroxysmal vertigo, right ear: Secondary | ICD-10-CM | POA: Diagnosis not present

## 2019-08-07 DIAGNOSIS — R262 Difficulty in walking, not elsewhere classified: Secondary | ICD-10-CM

## 2019-08-07 DIAGNOSIS — R2681 Unsteadiness on feet: Secondary | ICD-10-CM

## 2019-08-07 NOTE — Therapy (Signed)
[facility] [address] [place], [place], [address] Phone: [phone]   Fax:  [phone]  Physical Therapy Evaluation  Patient Details  Name: Crystal Dillon MRN: [id number] Date of Birth: [birth year] Referring Provider (PT): Caren Macadam, MD   Encounter Date: 55/19/2020  PT End of Session - 08/07/19 2153    Visit Number  1    Number of Visits  7    Date for PT Re-Evaluation  09/21/19    Authorization Type  BCBS; VL: 55    Authorization - Visit Number  1    Authorization - Number of Visits  60    PT Start Time  8675    PT Stop Time  1410    PT Time Calculation (min)  49 min    Activity Tolerance  Patient tolerated treatment well    Behavior During Therapy  [facility] for tasks assessed/performed       Past Medical History:  Diagnosis Date  . Anemia   . Anxiety   . Chronic urticaria 11/23/2018  . Complication of anesthesia    Blood pressure drops low  . Environmental and seasonal allergies   . GERD (gastroesophageal reflux disease)   . Migraine   . OA (osteoarthritis)   . PONV (postoperative nausea and vomiting)   . RAYNAUDS SYNDROME 01/06/2011   Qualifier: Diagnosis of  By: Arnoldo Morale MD, Balinda Quails   . Uterine fibroid    diagnosed by OB/GYN Dr Mayra Neer  . Varicose veins of left lower extremity with both ulcer of ankle and inflammation ([place]) 07/07/2011   Referral to interventional radiology for vascular study and potential intervention   . Vertigo     Past Surgical History:  Procedure Laterality Date  . ADENOIDECTOMY    . CESAREAN SECTION    . DENTAL SURGERY    . HYSTERECTOMY ABDOMINAL WITH SALPINGO-OOPHORECTOMY Bilateral 55/25/2020   Procedure: HYSTERECTOMY ABDOMINAL WITH SALPINGO-OOPHORECTOMY;  Surgeon: Marylynn Pearson, MD;  Location: [facility];  Service: Gynecology;  Laterality: Bilateral;  . HYSTEROSCOPY    . TONSILLECTOMY    . TUBAL LIGATION      There were no vitals filed for  this visit.   Subjective Assessment - 08/07/19 1325    Subjective  Pt reports dizziness began in May, followed up with PCP because dizziness has persisted and feels slightly worse.  Also has good days and days; appears to be affected by weather.  Has a history of migraines but they have increased in intensity and seem to be only on the R, also lying on the R intensifies the dizziness and HA.  When she has a migraine she feels a knot come up in the muscles at the base of her skull.    Pertinent History  raynaud's syndrome, OA, migraine, DDD of lumbar spine, anxiety, is being treated for B12 deficiency and recently underwent a hysterectomy    Diagnostic tests  None    Patient Stated Goals  Make the dizziness completely stop    Currently in Pain?  Yes    Pain Score  3     Pain Location  Head    Pain Orientation  Right    Pain Descriptors / Indicators  Pressure    Pain Type  Chronic pain         [facility] PT Assessment - 08/07/19 1330      Assessment   Medical Diagnosis  Vertigo    Referring Provider (PT)  Caren Macadam, MD  Onset Date/Surgical Date  --   dizziness began in May, 2020   Prior Therapy  Had one prior episode of vertigo x 6 months, went to see ENT who performed Epley manuever and put her in a neck brace for 3 days.        Precautions   Precautions  Other (comment)    Precaution Comments  raynaud's syndrome, OA, migraine, DDD of lumbar spine, anxiety, is being treated for B12 deficiency and recently underwent a hysterectomy      Restrictions   Other Position/Activity Restrictions  None now      Balance Screen   Has the patient fallen in the past 6 months  No      Prior Function   Level of Independence  Independent    Vocation  Full time employment    Vocation Requirements  Working from home due to [employer], on a computer most of the day    Leisure  Has limited her driving, taking care of small pets, looking back and forth at computer at work      Observation/Other  Assessments   Focus on Therapeutic Outcomes (FOTO)   N/A      Sensation   Light Touch  Impaired Detail    Additional Comments  Numbness and tingling on L side; improved with B12 supplements      Coordination   Gross Motor Movements are Fluid and Coordinated  Yes      ROM / Strength   AROM / PROM / Strength  Strength      Strength   Overall Strength  Deficits    Overall Strength Comments  L sided mild weakness - 4/5 overall, 5/5 on RLE           Vestibular Assessment - 08/07/19 1340      Symptom Behavior   Subjective history of current problem  Reports popping in R ear, tinnitus in L ear.  Unsure if she has had changes in hearing.  Vision is more blurry.  Does experience nausea but no vomiting.  Does have history of migraines    Type of Dizziness   Spinning    Frequency of Dizziness  daily    Duration of Dizziness  1 minute    Symptom Nature  Motion provoked    Aggravating Factors  Rolling to right;Sitting with head tilted back;Turning body quickly;Turning head quickly;Forward bending    Relieving Factors  Head stationary    Progression of Symptoms  Worse    History of similar episodes  Vertigo x 6 mo - 10 years ago      Oculomotor Exam   Oculomotor Alignment  Normal    Ocular ROM  WFL    Spontaneous  Absent    Gaze-induced   Absent    Smooth Pursuits  Intact    Saccades  Slow   dizziness   Comment  Convergence intact      Oculomotor Exam-Fixation Suppressed    Left Head Impulse  slight refixation saccade    Right Head Impulse  negative       Vestibulo-Ocular Reflex   VOR Cancellation  Comment   Reports dizziness   Comment  - Test of Skew      Positional Testing   Dix-Hallpike  Dix-Hallpike Right;Dix-Hallpike Left    Horizontal Canal Testing  Horizontal Canal Right;Horizontal Canal Left      Dix-Hallpike Right   Dix-Hallpike Right Duration  5 seconds    Dix-Hallpike Right Symptoms  No nystagmus  pt utilizing gaze fixation to suppress nystagmus      Dix-Hallpike Left   Dix-Hallpike Left Duration  0    Dix-Hallpike Left Symptoms  No nystagmus      Horizontal Canal Right   Horizontal Canal Right Duration  10 seconds    Horizontal Canal Right Symptoms  Normal      Horizontal Canal Left   Horizontal Canal Left Duration  0    Horizontal Canal Left Symptoms  Normal          Objective measurements completed on examination: See above findings.       Vestibular Treatment/Exercise - 08/07/19 1359      Vestibular Treatment/Exercise   Vestibular Treatment Provided  Canalith Repositioning    Canalith Repositioning  Epley Manuever Right       EPLEY MANUEVER RIGHT   Number of Reps   2    Overall Response  Improved Symptoms            PT Education - 08/07/19 2153    Education Details  clinical findings, PT POC and goals    Person(s) Educated  Patient    Methods  Explanation    Comprehension  Verbalized understanding          PT Long Term Goals - 08/07/19 2200      PT LONG TERM GOAL #1   Title  Pt will demonstrate independence with vestibular and balance HEP    Time  6    Period  Weeks    Status  New    Target Date  09/21/19      PT LONG TERM GOAL #2   Title  Pt will test negative for vertigo and nystagmus when assessed with positional testing    Baseline  R posterior canal BPPV    Time  6    Period  Weeks    Status  New    Target Date  09/21/19      PT LONG TERM GOAL #3   Title  Pt will decrease falls risk as indicated by increase in FGA by 4 points    Baseline  TBD    Time  6    Period  Weeks    Status  New    Target Date  09/21/19      PT LONG TERM GOAL #4   Title  Pt will demonstrate decrease visual motion sensitivity on MSQ and as reported by improved tolerance for looking at computer screens for work    Time  6    Period  Weeks    Status  New    Target Date  09/21/19             Plan - 08/07/19 2153    Clinical Impression Statement  Pt is a 53 year old female referred to Neuro OPPT  for evaluation of vertigo.  Pt's PMH is significant for the following: raynaud's syndrome, OA, migraine, DDD of lumbar spine, anxiety, is being treated for B12 deficiency and recently underwent a hysterectomy. The following deficits were noted during pt's exam: significant visual motion sensitivity, impaired VOR and vestibular hypofunction on L, true vertigo of short duration during R hallpike-dix positional testing -unable to visualize nystagmus due to pt suppressing with fixation - that responded well to CRM, impaired L sided strength and impaired balance placing patient at increased risk for falls. Pt would benefit from skilled PT to address these impairments and functional limitations to maximize functional mobility independence and reduce falls risk.  Personal Factors and Comorbidities  Comorbidity 3+;Past/Current Experience;Profession    Comorbidities  raynaud's syndrome, OA, migraine, DDD of lumbar spine, anxiety, is being treated for B12 deficiency and recently underwent a hysterectomy    Examination-Activity Limitations  Bed Mobility;Bend;Locomotion Level;Sleep;Other   working on a Social worker;Other   work   [employer]  Evolving/Moderate complexity    Clinical Decision Making  Moderate    Rehab Potential  Good    PT Frequency  1x / week    PT Duration  6 weeks    PT Treatment/Interventions  ADLs/Self Care Home Management;Canalith Repostioning;Gait training;Stair training;Functional mobility training;Therapeutic activities;Therapeutic exercise;Balance training;Neuromuscular re-education;Patient/family education;Vestibular    PT Next Visit Plan  Re check and treat R BPPV if needed.  Check MSQ and FGA; reset goals.  initiate x 1 viewing    Consulted and Agree with Plan of Care  Patient       Patient will benefit from skilled therapeutic intervention in order to improve the following deficits and impairments:  Decreased  balance, Decreased strength, Difficulty walking, Dizziness, Pain  Visit Diagnosis: 1. BPPV (benign paroxysmal positional vertigo), right   2. Dizziness and giddiness   3. Unsteadiness on feet   4. Muscle weakness (generalized)   5. Difficulty in walking, not elsewhere classified        Problem List Patient Active Problem List   Diagnosis Date Noted  . Primary osteoarthritis of both knees 12/14/2018  . Hx of migraines 11/23/2018  . Raynaud's syndrome without gangrene 11/23/2018  . Chronic urticaria 11/23/2018  . Family history of inflammatory bowel disease 11/23/2018  . Upper respiratory infection 02/28/2017  . Fibroids 09/20/2016  . Anxiety state 09/20/2016  . DDD (degenerative disc disease), lumbar 09/20/2016  . Migraine 09/20/2016  . Left hip pain 09/20/2016  . Chronic pain of both knees 09/20/2016  . Hyperglycemia 09/20/2016    Rico Junker, PT, DPT 08/07/19    10:05 PM    [facility] [address] [place], [place], [address] Phone: [phone]   Fax:  [phone]  Name: Cienna Dumais MRN: [id number] Date of Birth: [birth year]

## 2019-08-11 ENCOUNTER — Other Ambulatory Visit: Payer: Self-pay | Admitting: Family Medicine

## 2019-08-15 ENCOUNTER — Ambulatory Visit: Payer: BC Managed Care – PPO | Admitting: Rehabilitative and Restorative Service Providers"

## 2019-08-15 ENCOUNTER — Other Ambulatory Visit: Payer: Self-pay

## 2019-08-15 DIAGNOSIS — R2681 Unsteadiness on feet: Secondary | ICD-10-CM | POA: Diagnosis not present

## 2019-08-15 DIAGNOSIS — R42 Dizziness and giddiness: Secondary | ICD-10-CM

## 2019-08-15 DIAGNOSIS — H8111 Benign paroxysmal vertigo, right ear: Secondary | ICD-10-CM | POA: Diagnosis not present

## 2019-08-15 DIAGNOSIS — R262 Difficulty in walking, not elsewhere classified: Secondary | ICD-10-CM | POA: Diagnosis not present

## 2019-08-15 DIAGNOSIS — M6281 Muscle weakness (generalized): Secondary | ICD-10-CM | POA: Diagnosis not present

## 2019-08-15 NOTE — Patient Instructions (Signed)
Access Code: [email or web address]  URL: [email or web address]  Date: 08/15/2019  Prepared by: Rudell Cobb   Exercises Brandt-Daroff Vestibular Exercise - 3-5 reps - 2x daily - 7x weekly Seated VOR Cancellation - 5-10 reps - 2 sets - 2x daily - 7x weekly                  Vestibular Habituation - Seated Horizontal Head Rotation - 5 reps                   - 2 sets - 2x daily - 7x weekly Romberg Stance with Eyes Closed - 3 reps - 1 sets - 30 seconds hold - 2x daily - 7x weekly

## 2019-08-15 NOTE — Therapy (Signed)
[facility] [address] [place], [place], [address] Phone: [phone]   Fax:  [phone]  Physical Therapy Treatment  Patient Details  Name: Crystal Dillon MRN: [id number] Date of Birth: [birth year] Referring Provider (PT): Caren Macadam, MD   Encounter Date: 08/15/2019  PT End of Session - 08/15/19 1426    Visit Number  2    Number of Visits  7    Date for PT Re-Evaluation  09/21/19    Authorization Type  BCBS; VL: 48    Authorization - Visit Number  2    Authorization - Number of Visits  60    PT Start Time  1325    PT Stop Time  1412    PT Time Calculation (min)  47 min    Activity Tolerance  Patient tolerated treatment well    Behavior During Therapy  [facility] for tasks assessed/performed       Past Medical History:  Diagnosis Date  . Anemia   . Anxiety   . Chronic urticaria 11/23/2018  . Complication of anesthesia    Blood pressure drops low  . Environmental and seasonal allergies   . GERD (gastroesophageal reflux disease)   . Migraine   . OA (osteoarthritis)   . PONV (postoperative nausea and vomiting)   . RAYNAUDS SYNDROME 01/06/2011   Qualifier: Diagnosis of  By: Arnoldo Morale MD, Balinda Quails   . Uterine fibroid    diagnosed by OB/GYN Dr Mayra Neer  . Varicose veins of left lower extremity with both ulcer of ankle and inflammation ([place]) 07/07/2011   Referral to interventional radiology for vascular study and potential intervention   . Vertigo     Past Surgical History:  Procedure Laterality Date  . ADENOIDECTOMY    . CESAREAN SECTION    . DENTAL SURGERY    . HYSTERECTOMY ABDOMINAL WITH SALPINGO-OOPHORECTOMY Bilateral 06/13/2019   Procedure: HYSTERECTOMY ABDOMINAL WITH SALPINGO-OOPHORECTOMY;  Surgeon: Marylynn Pearson, MD;  Location[facility];  Service: Gynecology;  Laterality: Bilateral;  . HYSTEROSCOPY    . TONSILLECTOMY    . TUBAL LIGATION      There were no vitals filed for  this visit.  Subjective Assessment - 08/15/19 1326    Subjective  The patient reports that vertigo felt cleared the first day, then the second day, she had a "plummeting like being on a rollercoaster" sensation.  She feels R sided vertigo is back and balance is "off" some days worse than others.    Pertinent History  raynaud's syndrome, OA, migraine, DDD of lumbar spine, anxiety, is being treated for B12 deficiency and recently underwent a hysterectomy    Patient Stated Goals  Make the dizziness completely stop    Currently in Pain?  No/denies         [facility] PT Assessment - 08/15/19 1348      Ambulation/Gait   Ambulation/Gait  Yes    Ambulation/Gait Assistance  7: Independent;4: Min guard    Ambulation Distance (Feet)  100 Feet    Assistive device  None    Gait Pattern  --   varies with intermittent L sidestepping   Gait Comments  veering to the left started approximately a year ago.      Functional Gait  Assessment   Gait assessed   Yes    Gait Level Surface  Walks 20 ft, slow speed, abnormal gait pattern, evidence for imbalance or deviates 10-15 in outside of the 12 in walkway width. Requires more than  7 sec to ambulate 20 ft.   had significant left veering   Change in Gait Speed  Makes only minor adjustments to walking speed, or accomplishes a change in speed with significant gait deviations, deviates 10-15 in outside the 12 in walkway width, or changes speed but loses balance but is able to recover and continue walking.    Gait with Horizontal Head Turns  Performs head turns with moderate changes in gait velocity, slows down, deviates 10-15 in outside 12 in walkway width but recovers, can continue to walk.    Gait with Vertical Head Turns  Performs task with moderate change in gait velocity, slows down, deviates 10-15 in outside 12 in walkway width but recovers, can continue to walk.    Gait and Pivot Turn  Turns slowly, requires verbal cueing, or requires several small steps to catch  balance following turn and stop    Step Over Obstacle  Is able to step over one shoe box (4.5 in total height) but must slow down and adjust steps to clear box safely. May require verbal cueing.    Gait with Narrow Base of Support  Ambulates 4-7 steps.    Gait with Eyes Closed  Cannot walk 20 ft without assistance, severe gait deviations or imbalance, deviates greater than 15 in outside 12 in walkway width or will not attempt task.    Ambulating Backwards  Walks 20 ft, slow speed, abnormal gait pattern, evidence for imbalance, deviates 10-15 in outside 12 in walkway width.    Steps  Alternating feet, must use rail.    Total Score  10    FGA comment:  10/30 scored on FGA.  The patient has variable performance.         Vestibular Assessment - 08/15/19 1328      Vestibular Assessment   General Observation  Patient has stygmatism noted with observation of eyes.      Positional Testing   Dix-Hallpike  Dix-Hallpike Right;Dix-Hallpike Left    Sidelying Test  Sidelying Right;Sidelying Left    Horizontal Canal Testing  Horizontal Canal Right;Horizontal Canal Left      Dix-Hallpike Right   Dix-Hallpike Right Duration  trace- did not see due to patient closing her eyes    Dix-Hallpike Right Symptoms  No nystagmus      Dix-Hallpike Left   Dix-Hallpike Left Duration  0    Dix-Hallpike Left Symptoms  No nystagmus      Sidelying Right   Sidelying Right Duration  trace sensation of dizziness    Sidelying Right Symptoms  No nystagmus      Horizontal Canal Right   Horizontal Canal Right Duration  0    Horizontal Canal Right Symptoms  Normal      Horizontal Canal Left   Horizontal Canal Left Duration  0    Horizontal Canal Left Symptoms  Normal               [facility] Adult PT Treatment/Exercise - 08/15/19 1348      Neuro Re-ed    Neuro Re-ed Details   Corner standing on foam + eyes closed with significant sway, moved to solid surface + narrow BOS + eyes closed with increased sway.   Prescribed ffor home recommending begin with feet apart.      Vestibular Treatment/Exercise - 08/15/19 1339      Vestibular Treatment/Exercise   Vestibular Treatment Provided  Canalith Repositioning;Habituation;Gaze    Canalith Repositioning  Epley Manuever Right    Habituation Exercises  Laruth Bouchard  Daroff;Seated Horizontal Head Turns    Gaze Exercises  X1 Viewing Horizontal;Eye/Head Exercise Horizontal       EPLEY MANUEVER RIGHT   Number of Reps   1    Overall Response  Improved Symptoms    Response Details   on retest, with eyes open, no nystagmus viewed in room light      Nestor Lewandowsky   Number of Reps   4    Symptom Description   Symptoms improve with repetition. No nystagmus noted, however has sensation of rocking that settles within seconds.       Seated Horizontal Head Turns   Number of Reps   5    Symptom Description   provokes 4/10 dizziness      X1 Viewing Horizontal   Foot Position  seated    Comments  with glasses donned, patient tolerated x 5 reps      Eye/Head Exercise Horizontal   Foot Position  seated VOR cancellation    Comments  provokes motion sensitivity for visual stimulation + horizontal movement            PT Education - 08/15/19 1426    Education Details  home program established    Person(s) Educated  Patient    Methods  Explanation;Demonstration;Handout    Comprehension  Verbalized understanding;Returned demonstration          PT Long Term Goals - 08/15/19 1426      PT LONG TERM GOAL #1   Title  Pt will demonstrate independence with vestibular and balance HEP    Time  6    Period  Weeks    Status  On-going      PT LONG TERM GOAL #2   Title  Pt will test negative for vertigo and nystagmus when assessed with positional testing    Baseline  R posterior canal BPPV    Time  6    Period  Weeks    Status  On-going      PT LONG TERM GOAL #3   Title  Pt will decrease falls risk as indicated by increase in FGA by 4 points    Baseline  FGA  is 10/30    Time  6    Period  Weeks    Status  On-going      PT LONG TERM GOAL #4   Title  Pt will demonstrate decrease visual motion sensitivity on MSQ and as reported by improved tolerance for looking at computer screens for work    Time  6    Period  Weeks    Status  On-going            Plan - 08/15/19 1430    Clinical Impression Statement  The patient continued with dizziness with R dix hallpike that lasts for seconds, however nystagmus not viewed because patient closes her eyes.  Her FGA is 10/30 with significant variability in gait with occasional L veering.  PT added HEP to address motion sensitivity and balance today.  Plan to progress to patient tolerance.    PT Treatment/Interventions  ADLs/Self Care Home Management;Canalith Repostioning;Gait training;Stair training;Functional mobility training;Therapeutic activities;Therapeutic exercise;Balance training;Neuromuscular re-education;Patient/family education;Vestibular    PT Next Visit Plan  check HEP, check MSQ to document baseline, progress to tolerance.  *due to stygmatism, held VOR x 1 viewing (may have less benefit)    Consulted and Agree with Plan of Care  Patient       Patient will benefit from skilled therapeutic intervention in  order to improve the following deficits and impairments:  Decreased balance, Decreased strength, Difficulty walking, Dizziness, Pain  Visit Diagnosis: BPPV (benign paroxysmal positional vertigo), right  Dizziness and giddiness  Unsteadiness on feet  Difficulty in walking, not elsewhere classified     Problem List Patient Active Problem List   Diagnosis Date Noted  . Primary osteoarthritis of both knees 12/14/2018  . Hx of migraines 11/23/2018  . Raynaud's syndrome without gangrene 11/23/2018  . Chronic urticaria 11/23/2018  . Family history of inflammatory bowel disease 11/23/2018  . Upper respiratory infection 02/28/2017  . Fibroids 09/20/2016  . Anxiety state 09/20/2016  .  DDD (degenerative disc disease), lumbar 09/20/2016  . Migraine 09/20/2016  . Left hip pain 09/20/2016  . Chronic pain of both knees 09/20/2016  . Hyperglycemia 09/20/2016    Pieper Kasik, PT 08/15/2019, 2:33 PM  [facility] [address] [place], [place], [address] Phone: [phone]   Fax:  [phone]  Name: Crystal Dillon MRN: [id number] Date of Birth: 02-18-1966

## 2019-08-19 ENCOUNTER — Ambulatory Visit: Payer: BC Managed Care – PPO | Admitting: Rehabilitative and Restorative Service Providers"

## 2019-08-19 ENCOUNTER — Other Ambulatory Visit: Payer: Self-pay

## 2019-08-19 DIAGNOSIS — R42 Dizziness and giddiness: Secondary | ICD-10-CM | POA: Diagnosis not present

## 2019-08-19 DIAGNOSIS — R262 Difficulty in walking, not elsewhere classified: Secondary | ICD-10-CM

## 2019-08-19 DIAGNOSIS — R2681 Unsteadiness on feet: Secondary | ICD-10-CM

## 2019-08-19 DIAGNOSIS — H8111 Benign paroxysmal vertigo, right ear: Secondary | ICD-10-CM | POA: Diagnosis not present

## 2019-08-19 DIAGNOSIS — M6281 Muscle weakness (generalized): Secondary | ICD-10-CM | POA: Diagnosis not present

## 2019-08-19 NOTE — Therapy (Signed)
[facility] [address] [place], [place], [address] Phone: [phone]   Fax:  [phone]  Physical Therapy Treatment  Patient Details  Name: Crystal Dillon MRN: [id number] Date of Birth: 1966/06/19 Referring Provider (PT): Caren Macadam, MD   Encounter Date: 08/19/2019  PT End of Session - 08/19/19 2150    Visit Number  3    Number of Visits  7    Date for PT Re-Evaluation  09/21/19    Authorization Type  BCBS; VL: 51    Authorization - Visit Number  3    Authorization - Number of Visits  60    PT Start Time  1325    PT Stop Time  1408    PT Time Calculation (min)  43 min    Activity Tolerance  Patient tolerated treatment well    Behavior During Therapy  WFL for tasks assessed/performed       Past Medical History:  Diagnosis Date  . Anemia   . Anxiety   . Chronic urticaria 11/23/2018  . Complication of anesthesia    Blood pressure drops low  . Environmental and seasonal allergies   . GERD (gastroesophageal reflux disease)   . Migraine   . OA (osteoarthritis)   . PONV (postoperative nausea and vomiting)   . RAYNAUDS SYNDROME 01/06/2011   Qualifier: Diagnosis of  By: Arnoldo Morale MD, Balinda Quails   . Uterine fibroid    diagnosed by OB/GYN Dr Mayra Neer  . Varicose veins of left lower extremity with both ulcer of ankle and inflammation ([place]) 07/07/2011   Referral to interventional radiology for vascular study and potential intervention   . Vertigo     Past Surgical History:  Procedure Laterality Date  . ADENOIDECTOMY    . CESAREAN SECTION    . DENTAL SURGERY    . HYSTERECTOMY ABDOMINAL WITH SALPINGO-OOPHORECTOMY Bilateral 06/13/2019   Procedure: HYSTERECTOMY ABDOMINAL WITH SALPINGO-OOPHORECTOMY;  Surgeon: Marylynn Pearson, MD;  Location: [facility];  Service: Gynecology;  Laterality: Bilateral;  . HYSTEROSCOPY    . TONSILLECTOMY    . TUBAL LIGATION      There were no vitals filed for  this visit.  Subjective Assessment - 08/19/19 1329    Subjective  Dizziness stayed with her last session.  Cannot lay on the right side because it will bring on a headache.    Pertinent History  raynaud's syndrome, OA, migraine, DDD of lumbar spine, anxiety, is being treated for B12 deficiency and recently underwent a hysterectomy    Patient Stated Goals  Make the dizziness completely stop    Currently in Pain?  No/denies   dull headache, not pain per patient        [facility] PT Assessment - 08/19/19 1347      Functional Gait  Assessment   Gait assessed   Yes    Gait Level Surface  Walks 20 ft in less than 5.5 sec, no assistive devices, good speed, no evidence for imbalance, normal gait pattern, deviates no more than 6 in outside of the 12 in walkway width.    Change in Gait Speed  Able to change speed, demonstrates mild gait deviations, deviates 6-10 in outside of the 12 in walkway width, or no gait deviations, unable to achieve a major change in velocity, or uses a change in velocity, or uses an assistive device.    Gait with Horizontal Head Turns  Performs head turns smoothly with slight change in gait velocity (eg, minor disruption  to smooth gait path), deviates 6-10 in outside 12 in walkway width, or uses an assistive device.    Gait with Vertical Head Turns  Performs task with slight change in gait velocity (eg, minor disruption to smooth gait path), deviates 6 - 10 in outside 12 in walkway width or uses assistive device    Gait and Pivot Turn  Pivot turns safely in greater than 3 sec and stops with no loss of balance, or pivot turns safely within 3 sec and stops with mild imbalance, requires small steps to catch balance.    Step Over Obstacle  Is able to step over one shoe box (4.5 in total height) but must slow down and adjust steps to clear box safely. May require verbal cueing.    Gait with Narrow Base of Support  Is able to ambulate for 10 steps heel to toe with no staggering.    Gait  with Eyes Closed  Cannot walk 20 ft without assistance, severe gait deviations or imbalance, deviates greater than 15 in outside 12 in walkway width or will not attempt task.    Ambulating Backwards  Walks 20 ft, uses assistive device, slower speed, mild gait deviations, deviates 6-10 in outside 12 in walkway width.    Steps  Alternating feet, must use rail.    Total Score  19    FGA comment:  19/30  *patient performing better today with dynamic gait; re-tested portions of FGA and carried forward score from 8/27 on step over obstacle, steps, and gait with eyes closed.  Patient significantly improved.  She notes variability between good days and bad days.         Vestibular Assessment - 08/19/19 1333      Vestibular Assessment   General Observation  Patient's gait is more consistent today with less veering from midline.       Positional Sensitivities   Sit to Supine  Moderate dizziness    Supine to Left Side  No dizziness    Supine to Right Side  Mild dizziness    Supine to Sitting  Severe dizziness    Nose to Right Knee  Mild dizziness    Right Knee to Sitting  Mild dizziness    Nose to Left Knee  Lightheadedness    Left Knee to Sitting  Lightheadedness    Head Turning x 5  No dizziness    Head Nodding x 5  Lightheadedness    Pivot Right in Standing  No dizziness    Pivot Left in Standing  No dizziness    Rolling Right  Mild dizziness    Rolling Left  No dizziness    Positional Sensitivities Comments  bending to R per report is provoking of symptoms, as well as rising from supine to sitting.               [facility] Adult PT Treatment/Exercise - 08/19/19 2154      Ambulation/Gait   Ambulation/Gait  Yes    Ambulation/Gait Assistance  7: Independent    Ambulation Distance (Feet)  350 Feet    Assistive device  None    Gait Comments  Patient able to perform direction changes, heel/toe walking, tandem gait today.  She has small balance corrections, but is able to self correct.       Self-Care   Self-Care  Other Self-Care Comments    Other Self-Care Comments   Discussed habituation for Right sidelying to reduce sensitivity to this position and added notes to current  HEP.       Vestibular Treatment/Exercise - 08/19/19 2156      Vestibular Treatment/Exercise   Vestibular Treatment Provided  Habituation    Habituation Exercises  Standing Diagonal Head Turns;Seated Diagonal Head Turns      Seated Diagonal Head Turns   Number of Reps  5    Symptoms Description   The patient has dec'ing symptoms with repetition when sitting and bending nose towards R knee x 5 reps.  Repeated to theleft-- no symptoms to the left.      Standing Diagonal Head Turns   Number of Reps   5    Symptiom Description   Performed large amplitude diagonal motion reaching to R foot and then over L shoulder and reverse.  When bending to right, patient gets initial dizziness during first 2 reps.  Stopped to allow symptoms to settle and then able totoleate remaining 3 reps.             PT Education - 08/19/19 2150    Education Details  Updated home exercise program    Person(s) Educated  Patient    Methods  Explanation;Demonstration;Handout    Comprehension  Verbalized understanding;Returned demonstration          PT Long Term Goals - 08/19/19 2202      PT LONG TERM GOAL #1   Title  Pt will demonstrate independence with vestibular and balance HEP    Time  6    Period  Weeks    Status  On-going      PT LONG TERM GOAL #2   Title  Pt will test negative for vertigo and nystagmus when assessed with positional testing    Baseline  R posterior canal BPPV    Time  6    Period  Weeks    Status  On-going      PT LONG TERM GOAL #3   Title  Pt will decrease falls risk as indicated by increase in FGA by 4 points    Baseline  FGA is 10/30 on 8/27 and 19/30 on 08/19/19.  She has variability in her performance from day to day and notes this with her overall condition.    Time  6    Period   Weeks    Status  On-going      PT LONG TERM GOAL #4   Title  Pt will demonstrate decrease visual motion sensitivity on MSQ and as reported by improved tolerance for looking at computer screens for work    Time  6    Period  Weeks    Status  On-going            Plan - 08/19/19 2203    Clinical Impression Statement  The patient was able to tolerate increased activity today and demonstrated significant change in FGA as compared to last week's performance.  PT progressed HEP for larger amplitude habituation activities.  Plan to add VOR as tolerated next session.    PT Treatment/Interventions  ADLs/Self Care Home Management;Canalith Repostioning;Gait training;Stair training;Functional mobility training;Therapeutic activities;Therapeutic exercise;Balance training;Neuromuscular re-education;Patient/family education;Vestibular    PT Next Visit Plan  Check HEP, consider adding VOR x 1 viewing, progress habituation and visual motion sensitivity.    Consulted and Agree with Plan of Care  Patient       Patient will benefit from skilled therapeutic intervention in order to improve the following deficits and impairments:  Decreased balance, Decreased strength, Difficulty walking, Dizziness, Pain  Visit Diagnosis: Dizziness and giddiness  Unsteadiness on feet  Difficulty in walking, not elsewhere classified     Problem List Patient Active Problem List   Diagnosis Date Noted  . Primary osteoarthritis of both knees 12/14/2018  . Hx of migraines 11/23/2018  . Raynaud's syndrome without gangrene 11/23/2018  . Chronic urticaria 11/23/2018  . Family history of inflammatory bowel disease 11/23/2018  . Upper respiratory infection 02/28/2017  . Fibroids 09/20/2016  . Anxiety state 09/20/2016  . DDD (degenerative disc disease), lumbar 09/20/2016  . Migraine 09/20/2016  . Left hip pain 09/20/2016  . Chronic pain of both knees 09/20/2016  . Hyperglycemia 09/20/2016    Tailyn Hantz,  PT 08/19/2019, 10:05 PM  [facility] [address] [place], [place], [address] Phone: [phone]   Fax:  [phone]  Name: Natasia Heroux MRN: [id number] Date of Birth: [birth year]

## 2019-08-19 NOTE — Patient Instructions (Signed)
Access Code: [email or web address]  URL: [email or web address]  Date: 08/19/2019  Prepared by: Rudell Cobb   Program Notes  RIGHT SIDELYING:  1) Remain lying on your right side for 5 minutes, if tolerated.   Exercises Brandt-Daroff Vestibular Exercise - 3-5 reps - 2x daily - 7x weekly Seated VOR Cancellation - 5-10 reps - 2 sets - 2x daily - 7x weekly                  Romberg Stance with Eyes Closed - 3 reps - 1 sets - 30 seconds hold - 2x daily - 7x weekly Standing Diagonal Chops with Medicine Ball - 5 reps - 1 sets - 2x daily - 7x weekly

## 2019-09-02 ENCOUNTER — Encounter: Payer: Self-pay | Admitting: Physical Therapy

## 2019-09-02 ENCOUNTER — Ambulatory Visit: Payer: BC Managed Care – PPO | Attending: Family Medicine | Admitting: Physical Therapy

## 2019-09-02 ENCOUNTER — Other Ambulatory Visit: Payer: Self-pay

## 2019-09-02 DIAGNOSIS — R262 Difficulty in walking, not elsewhere classified: Secondary | ICD-10-CM

## 2019-09-02 DIAGNOSIS — R2681 Unsteadiness on feet: Secondary | ICD-10-CM

## 2019-09-02 DIAGNOSIS — M542 Cervicalgia: Secondary | ICD-10-CM | POA: Diagnosis not present

## 2019-09-02 DIAGNOSIS — H8111 Benign paroxysmal vertigo, right ear: Secondary | ICD-10-CM | POA: Diagnosis not present

## 2019-09-02 DIAGNOSIS — R42 Dizziness and giddiness: Secondary | ICD-10-CM

## 2019-09-02 DIAGNOSIS — M6281 Muscle weakness (generalized): Secondary | ICD-10-CM

## 2019-09-02 NOTE — Patient Instructions (Signed)

## 2019-09-03 NOTE — Therapy (Signed)
[facility] [address] [place], [place], [address] Phone: [phone]   Fax:  [phone]  Physical Therapy Treatment  Patient Details  Name: Crystal Dillon MRN: [id number] Date of Birth: 06-09-1966 Referring Provider (PT): Caren Macadam, MD   Encounter Date: 09/02/2019  PT End of Session - 09/03/19 1620    Visit Number  4    Number of Visits  7    Date for PT Re-Evaluation  09/21/19    Authorization Type  BCBS; VL: 56    Authorization - Visit Number  4    Authorization - Number of Visits  60    PT Start Time  1320    PT Stop Time  1405    PT Time Calculation (min)  45 min    Activity Tolerance  Patient tolerated treatment well    Behavior During Therapy  [facility] for tasks assessed/performed       Past Medical History:  Diagnosis Date  . Anemia   . Anxiety   . Chronic urticaria 11/23/2018  . Complication of anesthesia    Blood pressure drops low  . Environmental and seasonal allergies   . GERD (gastroesophageal reflux disease)   . Migraine   . OA (osteoarthritis)   . PONV (postoperative nausea and vomiting)   . RAYNAUDS SYNDROME 01/06/2011   Qualifier: Diagnosis of  By: Arnoldo Morale MD, Balinda Quails   . Uterine fibroid    diagnosed by OB/GYN Dr Mayra Neer  . Varicose veins of left lower extremity with both ulcer of ankle and inflammation ([place]) 07/07/2011   Referral to interventional radiology for vascular study and potential intervention   . Vertigo     Past Surgical History:  Procedure Laterality Date  . ADENOIDECTOMY    . CESAREAN SECTION    . DENTAL SURGERY    . HYSTERECTOMY ABDOMINAL WITH SALPINGO-OOPHORECTOMY Bilateral 06/13/2019   Procedure: HYSTERECTOMY ABDOMINAL WITH SALPINGO-OOPHORECTOMY;  Surgeon: Marylynn Pearson, MD;  Location: [facility];  Service: Gynecology;  Laterality: Bilateral;  . HYSTEROSCOPY    . TONSILLECTOMY    . TUBAL LIGATION      There were no vitals filed for  this visit.  Subjective Assessment - 09/02/19 1324    Subjective  Was doing better and then dizziness returned - still mostly to the R, but now feeling dizziness to L when she tips her head back.    Pertinent History  raynaud's syndrome, OA, migraine, DDD of lumbar spine, anxiety, is being treated for B12 deficiency and recently underwent a hysterectomy    Patient Stated Goals  Make the dizziness completely stop    Currently in Pain?  Yes    Pain Location  Head    Pain Descriptors / Indicators  Headache    Pain Type  Chronic pain         [facility] PT Assessment - 09/02/19 1349      Assessment   Medical Diagnosis  Vertigo    Referring Provider (PT)  Caren Macadam, MD    Onset Date/Surgical Date  --   dizziness began in May, 2020   Prior Therapy  Had one prior episode of vertigo x 6 months, went to see ENT who performed Epley manuever and put her in a neck brace for 3 days.        Precautions   Precautions  Other (comment)    Precaution Comments  raynaud's syndrome, OA, migraine, DDD of lumbar spine, anxiety, is being treated for B12 deficiency  and recently underwent a hysterectomy      Prior Function   Level of Independence  Independent      Observation/Other Assessments   Focus on Therapeutic Outcomes (FOTO)   N/A      Posture/Postural Control   Posture/Postural Control  Postural limitations    Posture Comments  Holds head tilted to L and rotated to L      ROM / Strength   AROM / PROM / Strength  AROM      AROM   Overall AROM   Deficits    AROM Assessment Site  Cervical    Cervical Flexion  45    Cervical Extension  45    Cervical - Right Side Bend  25    Cervical - Left Side Bend  40    Cervical - Right Rotation  50    Cervical - Left Rotation  70         Vestibular Assessment - 09/02/19 1326      Positional Testing   Sidelying Test  Sidelying Right;Sidelying Left      Dix-Hallpike Right   Dix-Hallpike Right Duration  15 seconds    Dix-Hallpike Right  Symptoms  Upbeat, right rotatory nystagmus      Dix-Hallpike Left   Dix-Hallpike Left Duration  0    Dix-Hallpike Left Symptoms  No nystagmus      Sidelying Right   Sidelying Right Duration  2-3 seconds    Sidelying Right Symptoms  Right nystagmus   right rotary, unable to see upbeating or downbeating     Sidelying Left   Sidelying Left Duration  1-2 seconds   during down to side and side to sit    Sidelying Left Symptoms  No nystagmus      Horizontal Canal Right   Horizontal Canal Right Duration  10 seconds    Horizontal Canal Right Symptoms  Nystagmus   right rotary, upbeating     Horizontal Canal Left   Horizontal Canal Left Duration  0    Horizontal Canal Left Symptoms  Normal                Vestibular Treatment/Exercise - 09/02/19 1336      Vestibular Treatment/Exercise   Vestibular Treatment Provided  Canalith Repositioning    Canalith Repositioning  Epley Manuever Right       EPLEY MANUEVER RIGHT   Number of Reps   2    Overall Response  Symptoms Resolved    Response Details   first retest pt demonstrated mitigation of symptoms but when sitting back upright she experienced significant spinning.  When assessing horizontal canal to R pt experienced vertigo and upbeating, R rotary nystagmus so performed another Epley manuever R            PT Education - 09/03/19 1618    Education Details  continues to experience R BPPV - advised to try x 2 nights sleeping on L side with pillows behind back to prevent rolling to R.  Due to significant neck tightness/tension, decreased ROM and headaches will add manual therapy and TDN to plan of care but would like pt to have nodule on posterior neck examined by doctor before proceeding with these treatments.  TDN for mm tension and headache relief.    Person(s) Educated  Patient    Methods  Explanation;Handout    Comprehension  Verbalized understanding          PT Long Term Goals - 09/03/19 [phone]  PT LONG TERM  GOAL #1   Title  Pt will demonstrate independence with vestibular and balance HEP  (LTG due by 09/21/2019)    Time  6    Period  Weeks    Status  On-going      PT LONG TERM GOAL #2   Title  Pt will test negative for vertigo and nystagmus when assessed with positional testing    Baseline  R posterior canal BPPV    Time  6    Period  Weeks    Status  On-going      PT LONG TERM GOAL #3   Title  Pt will decrease falls risk as indicated by increase in FGA by 4 points    Baseline  FGA is 10/30 on 8/27 and 19/30 on 08/19/19.  She has variability in her performance from day to day and notes this with her overall condition.    Time  6    Period  Weeks    Status  On-going      PT LONG TERM GOAL #4   Title  Pt will demonstrate decrease visual motion sensitivity on MSQ and as reported by improved tolerance for looking at computer screens for work    Time  6    Period  Weeks    Status  On-going      PT LONG TERM GOAL #5   Title  Pt will report decreased HA and improved cervical rotation and sidebending to R by 10-12 degrees    Baseline  see flow sheet    Time  6    Period  Weeks    Status  New    Target Date  09/21/19            Plan - 09/03/19 1622    Clinical Impression Statement  Due to pt reporting return of symptoms when rolling to R and that symptoms are waking her up in her sleep performed re-assessment of positional vertigo with R rotary, upbeating nystagmus of short duration with vertigo indicating reoccurence of R posterior canal BPPV.  Treated x 1 CRM with resolution of symptoms until pt returned to sitting upright and other canals assessed and symptoms of vertigo with R rotary, upbeating nystagmus returned - treated again with CRM and advised to avoid sleeping on R side x 2 nights.  Pt also continues to report decreased cervical ROM, headaches and increased mm tension.  Asssessed cervical ROM and palpated area where pt reports feeling a "knot" in her mm.  A nodule on R side of  spine was palpated but did not appear to be muscular; pt reports that the nodule will change in size at times.  Advised pt to contact physician regarding nodule prior to PT proceeding with TDN or manual therapy to the area.  Pt agreed.  Will continue to address dizziness as indicated.    Rehab Potential  Good    PT Frequency  1x / week    PT Duration  6 weeks    PT Treatment/Interventions  ADLs/Self Care Home Management;Canalith Repostioning;Gait training;Stair training;Functional mobility training;Therapeutic activities;Therapeutic exercise;Balance training;Neuromuscular re-education;Patient/family education;Vestibular;Cryotherapy;Moist Heat;Manual techniques;Passive range of motion;Dry needling;Taping;Spinal Manipulations    PT Next Visit Plan  re-check R BPPV and treat as needed, continue to assess cervical spine mobility in supine, Check HEP, consider adding VOR x 1 viewing, progress habituation and visual motion sensitivity.  TDN if appropriate to R side for headaches    Consulted and Agree with Plan of Care  Patient  Patient will benefit from skilled therapeutic intervention in order to improve the following deficits and impairments:  Decreased balance, Decreased strength, Difficulty walking, Dizziness, Pain, Decreased range of motion  Visit Diagnosis: Dizziness and giddiness  Cervicalgia  Unsteadiness on feet  Difficulty in walking, not elsewhere classified  BPPV (benign paroxysmal positional vertigo), right  Muscle weakness (generalized)     Problem List Patient Active Problem List   Diagnosis Date Noted  . Primary osteoarthritis of both knees 12/14/2018  . Hx of migraines 11/23/2018  . Raynaud's syndrome without gangrene 11/23/2018  . Chronic urticaria 11/23/2018  . Family history of inflammatory bowel disease 11/23/2018  . Upper respiratory infection 02/28/2017  . Fibroids 09/20/2016  . Anxiety state 09/20/2016  . DDD (degenerative disc disease), lumbar  09/20/2016  . Migraine 09/20/2016  . Left hip pain 09/20/2016  . Chronic pain of both knees 09/20/2016  . Hyperglycemia 09/20/2016    Rico Junker, PT, DPT 09/03/19    4:39 PM    [facility] [address] [place], [place], [address] Phone: [phone]   Fax:  [phone]  Name: Crystal Dillon MRN: [id number] Date of Birth: [birth year]

## 2019-09-06 ENCOUNTER — Ambulatory Visit (INDEPENDENT_AMBULATORY_CARE_PROVIDER_SITE_OTHER)
Admission: RE | Admit: 2019-09-06 | Discharge: 2019-09-06 | Disposition: A | Discharge status: Home / Self Care | Payer: BC Managed Care – PPO | Source: Ambulatory Visit | Attending: Family Medicine | Admitting: Family Medicine

## 2019-09-06 ENCOUNTER — Encounter: Payer: Self-pay | Admitting: Family Medicine

## 2019-09-06 ENCOUNTER — Other Ambulatory Visit: Payer: Self-pay

## 2019-09-06 ENCOUNTER — Ambulatory Visit (INDEPENDENT_AMBULATORY_CARE_PROVIDER_SITE_OTHER): Payer: BC Managed Care – PPO | Admitting: Family Medicine

## 2019-09-06 VITALS — BP 110/78 | HR 75 | Temp 97.5°F | Resp 12 | Ht 61.0 in | Wt 117.2 lb

## 2019-09-06 DIAGNOSIS — G9589 Other specified diseases of spinal cord: Secondary | ICD-10-CM | POA: Diagnosis not present

## 2019-09-06 DIAGNOSIS — M5030 Other cervical disc degeneration, unspecified cervical region: Secondary | ICD-10-CM | POA: Diagnosis not present

## 2019-09-06 DIAGNOSIS — R51 Headache: Secondary | ICD-10-CM

## 2019-09-06 DIAGNOSIS — M50322 Other cervical disc degeneration at C5-C6 level: Secondary | ICD-10-CM | POA: Diagnosis not present

## 2019-09-06 DIAGNOSIS — R519 Headache, unspecified: Secondary | ICD-10-CM

## 2019-09-06 DIAGNOSIS — M79672 Pain in left foot: Secondary | ICD-10-CM | POA: Diagnosis not present

## 2019-09-06 NOTE — Patient Instructions (Signed)
A few things to remember from today's visit:   Foot pain, left - Plan: DG Foot Complete Left  Cervical spinal mass ([place]) - Plan: DG Cervical Spine Complete  Pain of left foot could be related with arthritis. If pain continues you can arrange appointment with podiatrist.  I am going to be arranging CT or MRI of the cervical area to check not. Today we are going to do plain x-ray.  Please be sure medication list is accurate. If a new problem present, please set up appointment sooner than planned today.

## 2019-09-06 NOTE — Progress Notes (Signed)
ACUTE VISIT   HPI:  Chief Complaint  Patient presents with  . Knot on neck  . Foot Swelling    swelling and soreness in left foot    Crystal Dillon is a 53 y.o. Crystal Dillon is here today complaining of left foot pain, first MTP joint. She had trauma in 03/2019, she was helping her husband to move a propane tank and dropped it on foot. She did not seek medical attention. For a few days area was ecchymotic and edematous. She took Ibuprofen daily prn.  Noted a "knot" after edema resolved. It changes in size, tender and bigger when she first get up in the morning,it seems smaller as the day goes and less tender. She has not tried OTC medications. No numbness,tingling,or limitation of joint ROM.  Problem is stable.  She is also c/o "knot" in neck, which she "always" has had but recently her PT recommended having it check. She thinks this it causing her chronic headaches. Right hemicrania headache,associated nausea. Denies photophobia ,vomiting,or focal neurologic deficit. Lesions otherwise stable, it gets bigger when she has headache.  She is undergoing PT to treat neck pain radiated to left trapezium.  She denies fever,chills,fatigue,night sweats, or abnormal wt loss.  Review of Systems  Constitutional: Negative for activity change and appetite change.  HENT: Negative for mouth sores, nosebleeds, sore throat and trouble swallowing.   Eyes: Negative for redness and visual disturbance.  Respiratory: Negative for cough, shortness of breath and wheezing.   Cardiovascular: Negative for chest pain, palpitations and leg swelling.  Gastrointestinal: Negative for abdominal pain.       Negative for changes in bowel habits.  Musculoskeletal: Negative for gait problem.  Skin: Negative for rash and wound.  Neurological: Negative for syncope, facial asymmetry and weakness.  Rest see pertinent positives and negatives per HPI.   Current Outpatient Medications on File Prior to  Visit  Medication Sig Dispense Refill  . eletriptan (RELPAX) 40 MG tablet TAKE 1 TABLET BY MOUTH AT ONSET OF HEADACHE MAY REPEAT IN 2 HOURS IF PERSISTS OR REOCCURS 9 tablet 1  . famotidine (PEPCID) 20 MG tablet TAKE 1 TABLET BY MOUTH TWICE A DAY 180 tablet 0  . fexofenadine (ALLEGRA) 180 MG tablet Take 180 mg by mouth daily.    Marland Kitchen FLUoxetine (PROZAC) 20 MG capsule Take 1 capsule (20 mg total) by mouth daily. 90 capsule 1  . fluticasone (FLONASE) 50 MCG/ACT nasal spray Place 2 sprays into both nostrils daily. 16 g 6  . ibuprofen (ADVIL) 600 MG tablet Take 1 tablet (600 mg total) by mouth every 6 (six) hours as needed for mild pain. 30 tablet 0  . meclizine (ANTIVERT) 12.5 MG tablet Take 0.5-1 tablets (6.25-12.5 mg total) by mouth 3 (three) times daily as needed for dizziness. 30 tablet 2  . montelukast (SINGULAIR) 10 MG tablet TAKE 1 TABLET BY MOUTH EVERYDAY AT BEDTIME 90 tablet 0  . norethindrone (MICRONOR) 0.35 MG tablet     . ondansetron (ZOFRAN) 4 MG tablet Take 1 tablet (4 mg total) by mouth every 8 (eight) hours as needed for nausea or vomiting (migraines). 20 tablet 0   No current facility-administered medications on file prior to visit.      Past Medical History:  Diagnosis Date  . Anemia   . Anxiety   . Chronic urticaria 11/23/2018  . Complication of anesthesia    Blood pressure drops low  . Environmental and seasonal allergies   . GERD (gastroesophageal reflux  disease)   . Migraine   . OA (osteoarthritis)   . PONV (postoperative nausea and vomiting)   . RAYNAUDS SYNDROME 01/06/2011   Qualifier: Diagnosis of  By: Arnoldo Morale MD, Balinda Quails   . Uterine fibroid    diagnosed by OB/GYN Dr Mayra Neer  . Varicose veins of left lower extremity with both ulcer of ankle and inflammation ([place]) 07/07/2011   Referral to interventional radiology for vascular study and potential intervention   . Vertigo    No Known Allergies  Social History   Socioeconomic History  . Marital status:  Married    Spouse name: Not on file  . Number of children: Not on file  . Years of education: Not on file  . Highest education level: Not on file  Occupational History  . Occupation: [employer]    Employer: [employer]  . Occupation: Occupational psychologist  Social Needs  . Financial resource strain: Not on file  . Food insecurity    Worry: Not on file    Inability: Not on file  . Transportation needs    Medical: Not on file    Non-medical: Not on file  Tobacco Use  . Smoking status: Never Smoker  . Smokeless tobacco: Never Used  Substance and Sexual Activity  . Alcohol use: Yes    Comment: rare  . Drug use: No  . Sexual activity: Not on file  Lifestyle  . Physical activity    Days per week: Not on file    Minutes per session: Not on file  . Stress: Not on file  Relationships  . Social Herbalist on phone: Not on file    Gets together: Not on file    Attends religious service: Not on file    Active member of club or organization: Not on file    Attends meetings of clubs or organizations: Not on file    Relationship status: Not on file  Other Topics Concern  . Not on file  Social History Narrative   Work or School: [employer]       Home Situation: lives with husband and son 14 yo, and niece whom is 66 yo      Spiritual Beliefs: Christian      Lifestyle:trying to eat healthy; no regular exercise    Vitals:   09/06/19 0852  BP: 110/78  Pulse: 75  Resp: 12  Temp: (!) 97.5 F (36.4 C)  SpO2: 99%   Body mass index is 22.15 kg/m.   Physical Exam  Nursing note and vitals reviewed. Constitutional: She is oriented to person, place, and time. She appears well-developed and well-nourished. No distress.  HENT:  Head: Normocephalic and atraumatic.  Mouth/Throat: Oropharynx is clear and moist and mucous membranes are normal.  Eyes: Conjunctivae are normal.  Cardiovascular: Normal rate and regular rhythm.  No murmur heard. DP and PT present bilateral.  Normal capillary refill.   Respiratory: Effort normal and breath sounds normal. No respiratory distress.  GI: Soft. She exhibits no mass. There is no abdominal tenderness.  Musculoskeletal:        General: No edema.     Cervical back: She exhibits spasm. She exhibits normal range of motion.       Back:       Feet:     Comments: No foot masses appreciated. Left forefoot soft tissue is more prominent the right  Prominent cervical muscles, right side, with spasm. Right-sided cervical, hard nodular lesion,mildly tender. No mobile,about 2-2.5 cm.  Decreases in size with cervical flexion.  Lymphadenopathy:       Head (right side): No occipital adenopathy present.       Head (left side): No occipital adenopathy present.    She has no cervical adenopathy.  Neurological: She is alert and oriented to person, place, and time. She has normal strength. No cranial nerve deficit. Gait normal.  Skin: Skin is warm. No rash noted. No erythema.  Psychiatric: She has a normal mood and affect.  Well groomed, good eye contact.    ASSESSMENT AND PLAN:  Crystal Dillon was seen today for knot on neck and foot swelling.  Diagnoses and all orders for this visit:  Foot pain, left ? MTP OA. Because Hx of trauma,foot X ray ordered.Stretching exercises in the morning. If problem is not better,recommend podiatrist evaluation.  -     DG Foot Complete Left; Future  Cervical spinal mass (HCC) Problem seems to be chronic. ? Bone prominence (vertebral exostosis). Hx and examination do not suggest a serious process. Plain cervical imaging today and cervical MRI will be arranged. Instructed about warning signs.  -     DG Cervical Spine Complete; Future -     MR CERVICAL SPINE W WO CONTRAST; Future  Other cervical disc degeneration, unspecified cervical region -     MR CERVICAL SPINE W WO CONTRAST; Future  Headache, unspecified headache type ? Tension like headache vs migraine. Chronic problem. Continue  PT for cervicalgia.   Return if symptoms worsen or fail to improve with PCP.    Theresea Trautmann G. [place], MD  [facility]. [place] office.

## 2019-09-07 ENCOUNTER — Encounter: Payer: Self-pay | Admitting: Family Medicine

## 2019-09-09 ENCOUNTER — Ambulatory Visit: Payer: BC Managed Care – PPO | Admitting: Rehabilitative and Restorative Service Providers"

## 2019-09-09 ENCOUNTER — Encounter: Payer: Self-pay | Admitting: Family Medicine

## 2019-09-16 ENCOUNTER — Ambulatory Visit: Payer: BC Managed Care – PPO | Admitting: Physical Therapy

## 2019-09-16 ENCOUNTER — Other Ambulatory Visit: Payer: Self-pay

## 2019-09-16 ENCOUNTER — Encounter: Payer: Self-pay | Admitting: Physical Therapy

## 2019-09-16 DIAGNOSIS — R42 Dizziness and giddiness: Secondary | ICD-10-CM

## 2019-09-16 DIAGNOSIS — H8111 Benign paroxysmal vertigo, right ear: Secondary | ICD-10-CM

## 2019-09-16 DIAGNOSIS — M6281 Muscle weakness (generalized): Secondary | ICD-10-CM | POA: Diagnosis not present

## 2019-09-16 DIAGNOSIS — R2681 Unsteadiness on feet: Secondary | ICD-10-CM | POA: Diagnosis not present

## 2019-09-16 DIAGNOSIS — R262 Difficulty in walking, not elsewhere classified: Secondary | ICD-10-CM | POA: Diagnosis not present

## 2019-09-16 DIAGNOSIS — M542 Cervicalgia: Secondary | ICD-10-CM

## 2019-09-16 NOTE — Patient Instructions (Addendum)
Access Code: [email or web address]  URL: [email or web address]  Date: 09/16/2019  Prepared by: Misty Stanley   Program Notes  RIGHT SIDELYING:  1) Remain lying on your right side for 5 minutes, if tolerated.   Exercises Brandt-Daroff Vestibular Exercise - 3-5 reps - 2x daily - 7x weekly Seated VOR Cancellation - 5-10 reps - 2 sets - 2x daily - 7x weekly                  Romberg Stance with Eyes Closed - 3 reps - 1 sets - 30 seconds hold - 2x daily - 7x weekly Standing Diagonal Chops with Medicine Ball - 5 reps - 1 sets - 2x daily - 7x weekly Standing Gaze Stabilization with Head Rotation - 1 sets - 30-60 second hold - 2x daily - 7x weekly Standing Gaze Stabilization with Head Nod - 1 sets - 30-60 second hold - 2x daily - 7x weeklyonal Chops with Medicine Ball - 5 reps - 1 sets - 2x daily - 7x weekly

## 2019-09-17 NOTE — Therapy (Signed)
[facility] [address] [place], [place], [address] Phone: [phone]   Fax:  [phone]  Physical Therapy Treatment  Patient Details  Name: Crystal Dillon MRN: [id number] Date of Birth: 1966/03/05 Referring Provider (PT): Caren Macadam, MD   Encounter Date: 09/16/2019  PT End of Session - 09/17/19 1306    Visit Number  5    Number of Visits  7    Date for PT Re-Evaluation  09/21/19    Authorization Type  [employer]; VL: 79    Authorization - Visit Number  5    Authorization - Number of Visits  60    PT Start Time  [id number]    PT Stop Time  1433    PT Time Calculation (min)  44 min    Activity Tolerance  Patient tolerated treatment well    Behavior During Therapy  [facility] for tasks assessed/performed       Past Medical History:  Diagnosis Date  . Anemia   . Anxiety   . Chronic urticaria 11/23/2018  . Complication of anesthesia    Blood pressure drops low  . Environmental and seasonal allergies   . GERD (gastroesophageal reflux disease)   . Migraine   . OA (osteoarthritis)   . PONV (postoperative nausea and vomiting)   . RAYNAUDS SYNDROME 01/06/2011   Qualifier: Diagnosis of  By: Arnoldo Morale MD, Balinda Quails   . Uterine fibroid    diagnosed by OB/GYN Dr Mayra Neer  . Varicose veins of left lower extremity with both ulcer of ankle and inflammation ([place]) 07/07/2011   Referral to interventional radiology for vascular study and potential intervention   . Vertigo     Past Surgical History:  Procedure Laterality Date  . ADENOIDECTOMY    . CESAREAN SECTION    . DENTAL SURGERY    . HYSTERECTOMY ABDOMINAL WITH SALPINGO-OOPHORECTOMY Bilateral 06/13/2019   Procedure: HYSTERECTOMY ABDOMINAL WITH SALPINGO-OOPHORECTOMY;  Surgeon: Marylynn Pearson, MD;  Location: [facility];  Service: Gynecology;  Laterality: Bilateral;  . HYSTEROSCOPY    . TONSILLECTOMY    . TUBAL LIGATION      There were no vitals filed for  this visit.  Subjective Assessment - 09/16/19 1328    Subjective  Things are about the same.  She reports x-ray results came back and she is referred for MRI this Wednesday.  She reports she is doing exercises in the home, but not as frequently as she should be.  She does them everyday, but not the full recommended # of repetitions.  Her last HA was Saturday- lasted 3-4 hours with taking meds to reduce symtpoms.    Pertinent History  raynaud's syndrome, OA, migraine, DDD of lumbar spine, anxiety, is being treated for B12 deficiency and recently underwent a hysterectomy    Patient Stated Goals  Make the dizziness completely stop    Currently in Pain?  Yes    Pain Score  5     Pain Location  Neck    Pain Descriptors / Indicators  --   stiffness   Pain Type  Chronic pain    Pain Onset  More than a month ago    Pain Frequency  Intermittent    Aggravating Factors   turning her head             Vestibular Assessment - 09/16/19 1354      Positional Testing   Dix-Hallpike  Dix-Hallpike Right      Dix-Hallpike Right   Dix-Hallpike  Right Duration  1-2 seconds    Dix-Hallpike Right Symptoms  Other (comment)   pt's eyes were closed              [facility] Adult PT Treatment/Exercise - 09/17/19 1252      Manual Therapy   Manual Therapy  Joint mobilization;Myofascial release;Passive ROM;Manual Traction    Manual therapy comments  to address decreased cervical ROM     Joint Mobilization  lateral glides away from R side (towards L) while stabilizing mid-lower cervical spine x 2 minutes to increase available range and space on R side of neck due to limited rotation and lateral flexion    Myofascial Release  suboccipital release x 2-3 minutes    Passive ROM  to R upper trap x 2 reps x 60 seconds    Manual Traction  x 2-3 minutes in supine      Vestibular Treatment/Exercise - 09/16/19 1359      Vestibular Treatment/Exercise   Vestibular Treatment Provided  Canalith Repositioning;Gaze     Canalith Repositioning  Epley Manuever Right    Gaze Exercises  X1 Viewing Horizontal;X1 Viewing Vertical       EPLEY MANUEVER RIGHT   Number of Reps   1    Overall Response  Symptoms Resolved      X1 Viewing Horizontal   Foot Position  seated > standing    Reps  2    Comments  30 seconds progressed from sitting > standing with mild symptoms      X1 Viewing Vertical   Foot Position  seated > standing    Reps  2    Comments  30 seconds progressed from sitting > standing with mild symptoms            PT Education - 09/17/19 1305    Education Details  will continue to hold on TDN until after MRI completed.  re-instated x1 viewing    Person(s) Educated  Patient    Methods  Explanation;Demonstration;Handout    Comprehension  Verbalized understanding;Returned demonstration          PT Long Term Goals - 09/03/19 1632      PT LONG TERM GOAL #1   Title  Pt will demonstrate independence with vestibular and balance HEP  (LTG due by 09/21/2019)    Time  6    Period  Weeks    Status  On-going      PT LONG TERM GOAL #2   Title  Pt will test negative for vertigo and nystagmus when assessed with positional testing    Baseline  R posterior canal BPPV    Time  6    Period  Weeks    Status  On-going      PT LONG TERM GOAL #3   Title  Pt will decrease falls risk as indicated by increase in FGA by 4 points    Baseline  FGA is 10/30 on 8/27 and 19/30 on 08/19/19.  She has variability in her performance from day to day and notes this with her overall condition.    Time  6    Period  Weeks    Status  On-going      PT LONG TERM GOAL #4   Title  Pt will demonstrate decrease visual motion sensitivity on MSQ and as reported by improved tolerance for looking at computer screens for work    Time  6    Period  Weeks    Status  On-going  PT LONG TERM GOAL #5   Title  Pt will report decreased HA and improved cervical rotation and sidebending to R by 10-12 degrees    Baseline   see flow sheet    Time  6    Period  Weeks    Status  New    Target Date  09/21/19            Plan - 09/17/19 1307    Clinical Impression Statement  Patient's recent imaging (xray) did not demonstrate any significant findings but a follow up MRI was recommended to address soft tissue.  Will continue to hold on TDN until after MRI is performed and read.  Pt continues to report vertigo in R sidelying - performed hallpike-dix with very brief sensation of vertigo but still unable to visualize nystagmus due to fixation.  Symptoms resolved after one CRM and pt only reporting some mild "wooziness".  Re-initiated x1 viewing with pt demonstrating improved tolerance and ability to perform in standing today with mild symptoms.  Added to HEP.  Initiated manual therapy to cervical spine with pt tolerating with improvement in ROM to L and reporting decreased tension and pain on R.  Will continue to address in order to progress towards LTG.    Rehab Potential  Good    PT Frequency  1x / week    PT Duration  6 weeks    PT Treatment/Interventions  ADLs/Self Care Home Management;Canalith Repostioning;Gait training;Stair training;Functional mobility training;Therapeutic activities;Therapeutic exercise;Balance training;Neuromuscular re-education;Patient/family education;Vestibular;Cryotherapy;Moist Heat;Manual techniques;Passive range of motion;Dry needling;Taping;Spinal Manipulations    PT Next Visit Plan  Recertify for more visits.  What did MRI say? re-check R BPPV and treat as needed, Check HEP and add cervical ROM exercises/stretching, progress x1 viewing and habituation.  TDN if appropriate to R side for headaches    Consulted and Agree with Plan of Care  Patient       Patient will benefit from skilled therapeutic intervention in order to improve the following deficits and impairments:  Decreased balance, Decreased strength, Difficulty walking, Dizziness, Pain, Decreased range of motion  Visit  Diagnosis: Dizziness and giddiness  Cervicalgia  Unsteadiness on feet  Difficulty in walking, not elsewhere classified  BPPV (benign paroxysmal positional vertigo), right     Problem List Patient Active Problem List   Diagnosis Date Noted  . Primary osteoarthritis of both knees 12/14/2018  . Hx of migraines 11/23/2018  . Raynaud's syndrome without gangrene 11/23/2018  . Chronic urticaria 11/23/2018  . Family history of inflammatory bowel disease 11/23/2018  . Upper respiratory infection 02/28/2017  . Fibroids 09/20/2016  . Anxiety state 09/20/2016  . DDD (degenerative disc disease), lumbar 09/20/2016  . Migraine 09/20/2016  . Left hip pain 09/20/2016  . Chronic pain of both knees 09/20/2016  . Hyperglycemia 09/20/2016    Rico Junker, PT, DPT 09/17/19    1:17 PM    [facility] [address] [place], [place], [address] Phone: [phone]   Fax:  [phone]  Name: Crystal Dillon MRN: [id number] Date of Birth: 1966/09/17

## 2019-09-18 ENCOUNTER — Other Ambulatory Visit: Payer: Self-pay

## 2019-09-18 ENCOUNTER — Ambulatory Visit
Admission: RE | Admit: 2019-09-18 | Discharge: 2019-09-18 | Disposition: A | Discharge status: Home / Self Care | Payer: BC Managed Care – PPO | Source: Ambulatory Visit | Attending: Family Medicine | Admitting: Family Medicine

## 2019-09-18 DIAGNOSIS — G9589 Other specified diseases of spinal cord: Secondary | ICD-10-CM

## 2019-09-18 DIAGNOSIS — M5030 Other cervical disc degeneration, unspecified cervical region: Secondary | ICD-10-CM

## 2019-09-18 DIAGNOSIS — M5021 Other cervical disc displacement,  high cervical region: Secondary | ICD-10-CM | POA: Diagnosis not present

## 2019-09-18 MED ORDER — GADOBENATE DIMEGLUMINE 529 MG/ML IV SOLN
10.0000 mL | Freq: Once | INTRAVENOUS | Status: AC | PRN
  Administered 2019-09-18: 10 mL via INTRAVENOUS

## 2019-09-19 ENCOUNTER — Encounter: Payer: Self-pay | Admitting: Family Medicine

## 2019-09-20 ENCOUNTER — Telehealth: Payer: Self-pay | Admitting: *Deleted

## 2019-09-20 NOTE — Telephone Encounter (Signed)
Patient informed that provider is out of the office until Monday, but insists on another provider in the office go over results. Can you please take a look and I can call patient back. Thanks  Copied from [place] [id number]. Topic: General - Other >> Sep 20, 2019 11:40 AM Antonieta Iba C wrote: Reason for CRM: pt called in for MRI results   CB: [phone] >> Sep 20, 2019  2:32 PM Celene Kras A wrote: Pt called and is requesting to have another provider call her to discuss her mri results. Please advise.

## 2019-09-20 NOTE — Telephone Encounter (Signed)
MRI shows  - no soft tissue mass, muscle mass, or bone lesion - Normal appearance of cervical spinal cord - Mild bulging discs at C3-6  Follow up with PCP next week about results and treatment plan going forward

## 2019-09-20 NOTE — Telephone Encounter (Signed)
Returned call to patient and gave MRI results and recommendations. Patient verbalized understanding.

## 2019-09-23 ENCOUNTER — Encounter: Payer: Self-pay | Admitting: Family Medicine

## 2019-09-26 ENCOUNTER — Encounter: Payer: Self-pay | Admitting: Physical Therapy

## 2019-09-26 ENCOUNTER — Other Ambulatory Visit: Payer: Self-pay

## 2019-09-26 ENCOUNTER — Ambulatory Visit: Payer: BC Managed Care – PPO | Attending: Family Medicine | Admitting: Physical Therapy

## 2019-09-26 DIAGNOSIS — H8111 Benign paroxysmal vertigo, right ear: Secondary | ICD-10-CM

## 2019-09-26 DIAGNOSIS — R2681 Unsteadiness on feet: Secondary | ICD-10-CM | POA: Diagnosis not present

## 2019-09-26 DIAGNOSIS — R262 Difficulty in walking, not elsewhere classified: Secondary | ICD-10-CM

## 2019-09-26 DIAGNOSIS — M6281 Muscle weakness (generalized): Secondary | ICD-10-CM | POA: Diagnosis not present

## 2019-09-26 DIAGNOSIS — M542 Cervicalgia: Secondary | ICD-10-CM | POA: Diagnosis not present

## 2019-09-26 DIAGNOSIS — R42 Dizziness and giddiness: Secondary | ICD-10-CM | POA: Insufficient documentation

## 2019-09-26 NOTE — Therapy (Signed)
[facility] [address] [place], [place], [address] Phone: [phone]   Fax:  [phone]  Physical Therapy Treatment  Patient Details  Name: Crystal Dillon MRN: [id number] Date of Birth: 27-Jun-1966 Referring Provider (PT): Caren Macadam, MD   Encounter Date: 09/26/2019  PT End of Session - 09/26/19 1431    Visit Number  6    Number of Visits  10    Date for PT Re-Evaluation  11/10/19    Authorization Type  BCBS; VL: 48    Authorization - Visit Number  6    Authorization - Number of Visits  60    PT Start Time  9417    PT Stop Time  1322    PT Time Calculation (min)  47 min    Activity Tolerance  Patient tolerated treatment well    Behavior During Therapy  WFL for tasks assessed/performed       Past Medical History:  Diagnosis Date  . Anemia   . Anxiety   . Chronic urticaria 11/23/2018  . Complication of anesthesia    Blood pressure drops low  . Environmental and seasonal allergies   . GERD (gastroesophageal reflux disease)   . Migraine   . OA (osteoarthritis)   . PONV (postoperative nausea and vomiting)   . RAYNAUDS SYNDROME 01/06/2011   Qualifier: Diagnosis of  By: Arnoldo Morale MD, Balinda Quails   . Uterine fibroid    diagnosed by OB/GYN Dr Mayra Neer  . Varicose veins of left lower extremity with both ulcer of ankle and inflammation ([place]) 07/07/2011   Referral to interventional radiology for vascular study and potential intervention   . Vertigo     Past Surgical History:  Procedure Laterality Date  . ADENOIDECTOMY    . CESAREAN SECTION    . DENTAL SURGERY    . HYSTERECTOMY ABDOMINAL WITH SALPINGO-OOPHORECTOMY Bilateral 06/13/2019   Procedure: HYSTERECTOMY ABDOMINAL WITH SALPINGO-OOPHORECTOMY;  Surgeon: Marylynn Pearson, MD;  Location: [facility];  Service: Gynecology;  Laterality: Bilateral;  . HYSTEROSCOPY    . TONSILLECTOMY    . TUBAL LIGATION      There were no vitals filed for  this visit.  Subjective Assessment - 09/26/19 1325    Subjective  MRI came back without any abnormal findings.  Was a little sore after last session but recovered well.  Headache continues to be constant every day.  Dizziness has been less frequent since last session.    Pertinent History  raynaud's syndrome, OA, migraine, DDD of lumbar spine, anxiety, is being treated for B12 deficiency and recently underwent a hysterectomy    Patient Stated Goals  Make the dizziness completely stop    Currently in Pain?  Yes    Pain Onset  More than a month ago         [facility] PT Assessment - 09/26/19 1329      Assessment   Medical Diagnosis  Vertigo    Referring Provider (PT)  Caren Macadam, MD    Prior Therapy  Had one prior episode of vertigo x 6 months, went to see ENT who performed Epley manuever and put her in a neck brace for 3 days.        Precautions   Precautions  Other (comment)    Precaution Comments  raynaud's syndrome, OA, migraine, DDD of lumbar spine, anxiety, is being treated for B12 deficiency and recently underwent a hysterectomy      Prior Function   Level of Independence  Independent      Observation/Other Assessments   Focus on Therapeutic Outcomes (FOTO)   N/A      ROM / Strength   AROM / PROM / Strength  AROM   no dizziness or pain with ROM assessment     AROM   Overall AROM   Deficits    AROM Assessment Site  Cervical    Cervical Flexion  50    Cervical Extension  40    Cervical - Right Side Bend  40    Cervical - Left Side Bend  40    Cervical - Right Rotation  55    Cervical - Left Rotation  75      Functional Gait  Assessment   Gait assessed   Yes    Gait Level Surface  Walks 20 ft in less than 5.5 sec, no assistive devices, good speed, no evidence for imbalance, normal gait pattern, deviates no more than 6 in outside of the 12 in walkway width.    Change in Gait Speed  Able to smoothly change walking speed without loss of balance or gait deviation.  Deviate no more than 6 in outside of the 12 in walkway width.    Gait with Horizontal Head Turns  Performs head turns smoothly with slight change in gait velocity (eg, minor disruption to smooth gait path), deviates 6-10 in outside 12 in walkway width, or uses an assistive device.    Gait with Vertical Head Turns  Performs task with slight change in gait velocity (eg, minor disruption to smooth gait path), deviates 6 - 10 in outside 12 in walkway width or uses assistive device    Gait and Pivot Turn  Pivot turns safely within 3 sec and stops quickly with no loss of balance.    Step Over Obstacle  Is able to step over 2 stacked shoe boxes taped together (9 in total height) without changing gait speed. No evidence of imbalance.    Gait with Narrow Base of Support  Is able to ambulate for 10 steps heel to toe with no staggering.    Gait with Eyes Closed  Walks 20 ft, uses assistive device, slower speed, mild gait deviations, deviates 6-10 in outside 12 in walkway width. Ambulates 20 ft in less than 9 sec but greater than 7 sec.    Ambulating Backwards  Walks 20 ft, uses assistive device, slower speed, mild gait deviations, deviates 6-10 in outside 12 in walkway width.    Steps  Alternating feet, no rail.    Total Score  26    FGA comment:  26/30         Vestibular Assessment - 09/26/19 1341      Positional Testing   Dix-Hallpike  Dix-Hallpike Right      Dix-Hallpike Right   Dix-Hallpike Right Duration  5 seconds    Dix-Hallpike Right Symptoms  Upbeat, right rotatory nystagmus      Positional Sensitivities   Sit to Supine  Lightheadedness    Supine to Left Side  Lightheadedness    Supine to Right Side  Lightheadedness    Supine to Sitting  Mild dizziness    Right Hallpike  Mild dizziness    Up from Right Hallpike  Mild dizziness    Up from Left Hallpike  No dizziness    Nose to Right Knee  No dizziness    Right Knee to Sitting  No dizziness    Nose to Left Knee  No dizziness  Left  Knee to Sitting  No dizziness    Head Turning x 5  No dizziness    Head Nodding x 5  No dizziness    Pivot Right in Standing  No dizziness    Pivot Left in Standing  No dizziness    Rolling Right  Lightheadedness    Rolling Left  No dizziness                Vestibular Treatment/Exercise - 09/26/19 1345      Vestibular Treatment/Exercise   Vestibular Treatment Provided  Canalith Repositioning;Gaze    Canalith Repositioning  Epley Manuever Right       EPLEY MANUEVER RIGHT   Number of Reps   1    Overall Response  Symptoms Resolved            PT Education - 09/26/19 1431    Education Details  Progress towards goals; areas to continue to address, plan for future visits    Person(s) Educated  Patient    Methods  Explanation    Comprehension  Verbalized understanding          PT Long Term Goals - 09/26/19 1328      PT LONG TERM GOAL #1   Title  Pt will demonstrate independence with vestibular and balance HEP  (LTG due by 09/21/2019)    Time  6    Period  Weeks    Status  On-going      PT LONG TERM GOAL #2   Title  Pt will test negative for vertigo and nystagmus when assessed with positional testing    Baseline  R posterior canal BPPV    Time  6    Period  Weeks    Status  Partially Met      PT LONG TERM GOAL #3   Title  Pt will decrease falls risk as indicated by increase in FGA by 4 points    Baseline  15 point increase    Time  6    Period  Weeks    Status  Achieved      PT LONG TERM GOAL #4   Title  Pt will demonstrate decrease visual motion sensitivity on MSQ and as reported by improved tolerance for looking at computer screens for work    Baseline  decreased sensitivity to looking at various computer screens; most sensitive motions have decreased by 2 points in severity    Time  6    Period  Weeks    Status  Achieved      PT LONG TERM GOAL #5   Title  Pt will report decreased HA and improved cervical rotation and sidebending to R by 10-12  degrees    Baseline  headaches about the same, ROM has improved by 15 deg for sidebending, 5 deg for rotation    Time  6    Period  Weeks    Status  Partially Met        New goals for re-certification: PT Long Term Goals - 09/26/19 1328      PT LONG TERM GOAL #1   Title  Pt will demonstrate independence with vestibular and balance HEP    Time  6    Period  Weeks    Status  On-going    Target Date  11/10/19      PT LONG TERM GOAL #2   Title  Pt will test negative for vertigo and nystagmus when assessed with positional testing  Baseline  R posterior canal BPPV    Time  6    Period  Weeks    Status  Revised    Target Date  11/10/19      PT LONG TERM GOAL #3   Title  Pt will decrease falls risk as indicated by increase in FGA to >/= 28/30 points    Baseline  26/30    Time  6    Period  Weeks    Status  Revised    Target Date  11/10/19      PT LONG TERM GOAL #4   Title  Pt will demonstrate decreased motion sensitivity to rolling to R and sit <> R sidelying to 0-1/5    Baseline  2/5 overall    Time  6    Period  Weeks    Status  Revised    Target Date  11/10/19      PT LONG TERM GOAL #5   Title  Pt will report decreased HA and improved cervical rotation to >/= 60 deg and will improve other movements by 5 more degrees.    Baseline  headaches about the same, ROM has improved by 15 deg for sidebending, 5 deg for rotation    Time  6    Period  Weeks    Status  Revised    Target Date  11/10/19          Plan - 09/26/19 1437    Clinical Impression Statement  Pt's MRI was without abnormal findings.  Treatment session focused on assessment of progress towards LTG.  Pt is making good progress and has met 2/5 LTG, partially met 2 goals with HEP ongoing.  Pt continues to demonstrate positive positional testing for R BPPV which clears with CRM but returns by next session.  Pt is having decreased intensity and duration of vertigo and demonstrates decreased motion sensitivity to  bed mobility, head movements and bending down.  Pt demonstrates improvement in dynamic balance during gait.  In addition to ongoing vertigo pt continues to demonstrate mild motion sensitivity to bed mobility, mild imbalance with head turns during gait and continued headaches with decreased cervical ROM.  Pt will benefit from ongoing skilled PT services to address these impairments to maximize functional mobility independence and decrease falls risk.    Rehab Potential  Good    PT Frequency  1x / week    PT Duration  4 weeks    PT Treatment/Interventions  ADLs/Self Care Home Management;Canalith Repostioning;Gait training;Stair training;Functional mobility training;Therapeutic activities;Therapeutic exercise;Balance training;Neuromuscular re-education;Patient/family education;Vestibular;Cryotherapy;Moist Heat;Manual techniques;Passive range of motion;Dry needling;Taping;Spinal Manipulations    PT Next Visit Plan  Check HEP and add cervical ROM exercises/stretching, progress x1 viewing and habituation for sitting <> R sidelying.  Balance with head turns. TDN if appropriate to R side for headaches.  re-check R BPPV and treat as needed,    Consulted and Agree with Plan of Care  Patient       Patient will benefit from skilled therapeutic intervention in order to improve the following deficits and impairments:  Decreased balance, Decreased strength, Difficulty walking, Dizziness, Pain, Decreased range of motion  Visit Diagnosis: Dizziness and giddiness  Cervicalgia  Unsteadiness on feet  Difficulty in walking, not elsewhere classified  BPPV (benign paroxysmal positional vertigo), right  Muscle weakness (generalized)     Problem List Patient Active Problem List   Diagnosis Date Noted  . Primary osteoarthritis of both knees 12/14/2018  . Hx of migraines 11/23/2018  .  Raynaud's syndrome without gangrene 11/23/2018  . Chronic urticaria 11/23/2018  . Family history of inflammatory bowel  disease 11/23/2018  . Upper respiratory infection 02/28/2017  . Fibroids 09/20/2016  . Anxiety state 09/20/2016  . DDD (degenerative disc disease), lumbar 09/20/2016  . Migraine 09/20/2016  . Left hip pain 09/20/2016  . Chronic pain of both knees 09/20/2016  . Hyperglycemia 09/20/2016    Rico Junker, PT, DPT 09/26/19    3:58 PM    [facility] [address] [place], [place], [address] Phone: [phone]   Fax:  [phone]  Name: Crystal Dillon MRN: [id number] Date of Birth: [birth year]

## 2019-10-03 ENCOUNTER — Other Ambulatory Visit: Payer: Self-pay

## 2019-10-03 ENCOUNTER — Ambulatory Visit: Payer: BC Managed Care – PPO | Admitting: Rehabilitative and Restorative Service Providers"

## 2019-10-03 ENCOUNTER — Encounter: Payer: Self-pay | Admitting: Rehabilitative and Restorative Service Providers"

## 2019-10-03 DIAGNOSIS — M6281 Muscle weakness (generalized): Secondary | ICD-10-CM | POA: Diagnosis not present

## 2019-10-03 DIAGNOSIS — R42 Dizziness and giddiness: Secondary | ICD-10-CM | POA: Diagnosis not present

## 2019-10-03 DIAGNOSIS — H8111 Benign paroxysmal vertigo, right ear: Secondary | ICD-10-CM

## 2019-10-03 DIAGNOSIS — R262 Difficulty in walking, not elsewhere classified: Secondary | ICD-10-CM | POA: Diagnosis not present

## 2019-10-03 DIAGNOSIS — M542 Cervicalgia: Secondary | ICD-10-CM | POA: Diagnosis not present

## 2019-10-03 DIAGNOSIS — R2681 Unsteadiness on feet: Secondary | ICD-10-CM

## 2019-10-03 NOTE — Therapy (Signed)
[facility] [address] [place], [place], [address] Phone: [phone]   Fax:  [phone]  Physical Therapy Treatment  Patient Details  Name: Crystal Dillon MRN: [id number] Date of Birth: [birth year] Referring Provider (PT): Caren Macadam, MD   Encounter Date: 10/03/2019  PT End of Session - 10/03/19 1233    Visit Number  7    Number of Visits  10    Date for PT Re-Evaluation  11/10/19    Authorization Type  BCBS; VL: 89    Authorization - Visit Number  7    Authorization - Number of Visits  60    PT Start Time  [id number]    PT Stop Time  1314    PT Time Calculation (min)  40 min    Activity Tolerance  Patient tolerated treatment well    Behavior During Therapy  [facility] for tasks assessed/performed       Past Medical History:  Diagnosis Date  . Anemia   . Anxiety   . Chronic urticaria 11/23/2018  . Complication of anesthesia    Blood pressure drops low  . Environmental and seasonal allergies   . GERD (gastroesophageal reflux disease)   . Migraine   . OA (osteoarthritis)   . PONV (postoperative nausea and vomiting)   . RAYNAUDS SYNDROME 01/06/2011   Qualifier: Diagnosis of  By: Arnoldo Morale MD, Balinda Quails   . Uterine fibroid    diagnosed by OB/GYN Dr Mayra Neer  . Varicose veins of left lower extremity with both ulcer of ankle and inflammation ([place]) 07/07/2011   Referral to interventional radiology for vascular study and potential intervention   . Vertigo     Past Surgical History:  Procedure Laterality Date  . ADENOIDECTOMY    . CESAREAN SECTION    . DENTAL SURGERY    . HYSTERECTOMY ABDOMINAL WITH SALPINGO-OOPHORECTOMY Bilateral 06/13/2019   Procedure: HYSTERECTOMY ABDOMINAL WITH SALPINGO-OOPHORECTOMY;  Surgeon: Marylynn Pearson, MD;  Location: [facility];  Service: Gynecology;  Laterality: Bilateral;  . HYSTEROSCOPY    . TONSILLECTOMY    . TUBAL LIGATION      There were no vitals filed  for this visit.  Subjective Assessment - 10/03/19 1233    Subjective  The patient reports no pain and no headache today.  The dizziness is gradually getting better, but it has not completely gone away.    Pertinent History  raynaud's syndrome, OA, migraine, DDD of lumbar spine, anxiety, is being treated for B12 deficiency and recently underwent a hysterectomy    Patient Stated Goals  Make the dizziness completely stop    Currently in Pain?  No/denies             Vestibular Assessment - 10/03/19 1242      Vestibular Assessment   General Observation  Still notes dizziness during habituation HEP.      Positional Testing   Dix-Hallpike  Dix-Hallpike Right      Dix-Hallpike Right   Dix-Hallpike Right Duration  6 secibds    Dix-Hallpike Right Symptoms  Upbeat, right rotatory nystagmus      Sidelying Right   Sidelying Right Duration  0    Sidelying Right Symptoms  No nystagmus      Sidelying Left   Sidelying Left Duration  0    Sidelying Left Symptoms  No nystagmus      Horizontal Canal Right   Horizontal Canal Right Duration  0    Horizontal Canal Right Symptoms  Normal  Horizontal Canal Left   Horizontal Canal Left Duration  0    Horizontal Canal Left Symptoms  Normal               [facility] Adult PT Treatment/Exercise - 10/03/19 1915      Ambulation/Gait   Gait Comments  walking with eyes closed, backwards walking / dynamic gait activities performed focusing on balance and changing directions.      Exercises   Exercises  Other Exercises    Other Exercises   Seated upper trapezius stretching, seated levator stretching -- added both for HEP.  Patient has dec'd stiffness reported after stretching.        Vestibular Treatment/Exercise - 10/03/19 1545      Vestibular Treatment/Exercise   Vestibular Treatment Provided  Canalith Repositioning;Gaze;Habituation    Canalith Repositioning  Epley Manuever Right    Habituation Exercises  [employer]    Gaze  Exercises  X1 Viewing Horizontal;Eye/Head Exercise Horizontal;X1 Viewing Vertical       EPLEY MANUEVER RIGHT   Number of Reps   2    Overall Response  Symptoms Resolved      Nestor Lewandowsky   Number of Reps   2    Symptom Description   no dizziness after doing epley's-- reviewed for HEP      X1 Viewing Horizontal   Foot Position  x 1 viewing     Comments  60 seconds-- increased to 60 seconds for HEP      X1 Viewing Vertical   Foot Position  x 1 viewing    Comments  60 seconds, noting some tightness in neck musculature      Eye/Head Exercise Horizontal   Foot Position  seated VOR cancellation    Comments  no longer provokes symptoms-- reports does not provoke symptoms during HEP            PT Education - 10/03/19 1308    Education Details  Removed VOR cancellation (*no longer increasing symptoms and reports improvement with screen tolerance), added neck stretching    Person(s) Educated  Patient    Methods  Explanation    Comprehension  Verbalized understanding       PT Short Term Goals - 09/26/19 1553      PT SHORT TERM GOAL #1   Title  = LTG        PT Long Term Goals - 09/26/19 1328      PT LONG TERM GOAL #1   Title  Pt will demonstrate independence with vestibular and balance HEP    Time  6    Period  Weeks    Status  On-going    Target Date  11/10/19      PT LONG TERM GOAL #2   Title  Pt will test negative for vertigo and nystagmus when assessed with positional testing    Baseline  R posterior canal BPPV    Time  6    Period  Weeks    Status  Revised    Target Date  11/10/19      PT LONG TERM GOAL #3   Title  Pt will decrease falls risk as indicated by increase in FGA to >/= 28/30 points    Baseline  26/30    Time  6    Period  Weeks    Status  Revised    Target Date  11/10/19      PT LONG TERM GOAL #4   Title  Pt will demonstrate decreased  motion sensitivity to rolling to R and sit <> R sidelying to 0-1/5    Baseline  2/5 overall    Time  6     Period  Weeks    Status  Revised    Target Date  11/10/19      PT LONG TERM GOAL #5   Title  Pt will report decreased HA and improved cervical rotation to >/= 60 deg and will improve other movements by 5 more degrees.    Baseline  headaches about the same, ROM has improved by 15 deg for sidebending, 5 deg for rotation    Time  6    Period  Weeks    Status  Revised    Target Date  11/10/19            Plan - 10/03/19 1313    Clinical Impression Statement  The patient continued with trace R BPPV today that resolves with Epley's maneuver.  She notes overall improvement in dizziness, balance, and tolerance to viewing computer screens at work.  PT modified HEP today to continue habituation and added neck stretching for self mgmt of stiffness.    PT Treatment/Interventions  ADLs/Self Care Home Management;Canalith Repostioning;Gait training;Stair training;Functional mobility training;Therapeutic activities;Therapeutic exercise;Balance training;Neuromuscular re-education;Patient/family education;Vestibular;Cryotherapy;Moist Heat;Manual techniques;Passive range of motion;Dry needling;Taping;Spinal Manipulations    PT Next Visit Plan  Patients wants to try dry needling (feels like she would wonder if it could've helped if she doesn't do it), review neck stretches from today's visits added to HEP, assess for R BPPV and treat as indicated.  Dynamic gait activities with forward walking/backwards walking, gait with eyes closed    Consulted and Agree with Plan of Care  Patient       Patient will benefit from skilled therapeutic intervention in order to improve the following deficits and impairments:  Decreased balance, Decreased strength, Difficulty walking, Dizziness, Pain, Decreased range of motion  Visit Diagnosis: Dizziness and giddiness  Cervicalgia  Unsteadiness on feet  Difficulty in walking, not elsewhere classified  BPPV (benign paroxysmal positional vertigo), right     Problem  List Patient Active Problem List   Diagnosis Date Noted  . Primary osteoarthritis of both knees 12/14/2018  . Hx of migraines 11/23/2018  . Raynaud's syndrome without gangrene 11/23/2018  . Chronic urticaria 11/23/2018  . Family history of inflammatory bowel disease 11/23/2018  . Upper respiratory infection 02/28/2017  . Fibroids 09/20/2016  . Anxiety state 09/20/2016  . DDD (degenerative disc disease), lumbar 09/20/2016  . Migraine 09/20/2016  . Left hip pain 09/20/2016  . Chronic pain of both knees 09/20/2016  . Hyperglycemia 09/20/2016    Jersey Ravenscroft , PT 10/03/2019, 7:20 PM  [facility] [address] [place], [place], [address] Phone: [phone]   Fax:  [phone]  Name: Jarrica Rakers MRN: [id number] Date of Birth: 03/12/1966

## 2019-10-03 NOTE — Patient Instructions (Signed)
Access Code: [email or web address]  URL: [email or web address]  Date: 10/03/2019  Prepared by: Rudell Cobb   Program Notes  RIGHT SIDELYING:  1) Remain lying on your right side for 5 minutes, if tolerated.  *These may have to be modified based on how you feel and if you have a headache. For instance, you may not tolerate 1 minute of gaze exercises and may reduce duration to 30 seconds.   Exercises Brandt-Daroff Vestibular Exercise - 5 reps - 2x daily - 7x weekly Romberg Stance with Eyes Closed - 3 reps - 1 sets - 30 seconds hold - 2x daily - 7x weekly Standing Diagonal Chops with Medicine Ball - 5 reps - 1 sets - 2x daily - 7x weekly Standing Gaze Stabilization with Head Rotation - 1 sets - 60 second hold - 2x daily - 7x weekly Standing Gaze Stabilization with Head Nod - 1 sets - 60 second hold - 2x daily - 7x weekly Seated Levator Scapulae Stretch - 3 reps - 1 sets - 15 seconds hold - 2x daily - 7x weekly Seated Upper Trapezius Stretch - 3 reps - 1 sets - 15 seconds hold - 2x daily - 7x weekly

## 2019-10-10 ENCOUNTER — Encounter: Payer: BC Managed Care – PPO | Admitting: Physical Therapy

## 2019-10-14 ENCOUNTER — Encounter: Payer: Self-pay | Admitting: Physical Therapy

## 2019-10-14 NOTE — Therapy (Signed)
[facility] [address] [place], [place], [address] Phone: [phone]   Fax:  [phone]  Patient Details  Name: Talayia Hjort MRN: [id number] Date of Birth: 15-Jan-1966 Referring Provider:  No ref. provider found  Encounter Date: 10/14/2019  PHYSICAL THERAPY DISCHARGE SUMMARY  Visits from Start of Care: 7  Current functional level related to goals / functional outcomes: Unable to fully assess - pt cancelled remaining visits and requested D/C from neuro outpatient therapy due to transitioning care to [facility].   Remaining deficits: Dizziness, headache, neck pain   Education / Equipment: HEP  Plan: Patient agrees to discharge.  Patient goals were not met. Patient is being discharged due to the patient's request.  ?????     Rico Junker, PT, DPT 10/14/19    10:01 AM    [facility] [address] [place], [place], [address] Phone: [phone]   Fax:  [phone]

## 2019-10-24 ENCOUNTER — Encounter: Payer: BC Managed Care – PPO | Admitting: Physical Therapy

## 2019-10-28 ENCOUNTER — Other Ambulatory Visit: Payer: Self-pay | Admitting: Family Medicine

## 2019-10-30 ENCOUNTER — Telehealth: Payer: Self-pay | Admitting: Family Medicine

## 2019-10-30 NOTE — Telephone Encounter (Signed)
Copied from [place] [id number]. Topic: General - Other >> Oct 30, 2019  4:11 PM Keene Breath wrote: Reason for CRM: Patient called to ask if the nurse or doctor can call her in something for poison ivy at her local pharmacy.  Patient was infected on Sunday.  Please advise and call patient to let her know at [phone]

## 2019-10-31 ENCOUNTER — Other Ambulatory Visit: Payer: Self-pay

## 2019-10-31 ENCOUNTER — Telehealth (INDEPENDENT_AMBULATORY_CARE_PROVIDER_SITE_OTHER): Payer: BC Managed Care – PPO | Admitting: Family Medicine

## 2019-10-31 ENCOUNTER — Encounter: Payer: Self-pay | Admitting: Family Medicine

## 2019-10-31 VITALS — HR 74 | Temp 98.5°F

## 2019-10-31 DIAGNOSIS — L03119 Cellulitis of unspecified part of limb: Secondary | ICD-10-CM

## 2019-10-31 DIAGNOSIS — R21 Rash and other nonspecific skin eruption: Secondary | ICD-10-CM | POA: Diagnosis not present

## 2019-10-31 MED ORDER — CEPHALEXIN 500 MG PO CAPS: 500.0000 mg | ORAL_CAPSULE | Freq: Two times a day (BID) | ORAL | 0 refills | Status: DC

## 2019-10-31 MED ORDER — HYDROXYZINE HCL 25 MG PO TABS: 25.0000 mg | ORAL_TABLET | Freq: Three times a day (TID) | ORAL | 0 refills | Status: DC | PRN

## 2019-10-31 MED ORDER — FOSFOMYCIN TROMETHAMINE 3 G PO PACK: 3.0000 g | PACK | Freq: Once | ORAL | 1 refills | Status: DC

## 2019-10-31 NOTE — Telephone Encounter (Signed)
Pt has been scheduled for virtual visit with Dr Maudie Mercury

## 2019-10-31 NOTE — Progress Notes (Signed)
Virtual Visit via Video Note  I connected with Crystal Dillon  on 10/31/19 at 10:20 AM EST by a video enabled telemedicine application and verified that I am speaking with the correct person using two identifiers.  Location patient: home Location provider:work or home office Persons participating in the virtual visit: patient, provider  I discussed the limitations of evaluation and management by telemedicine and the availability of in person appointments. The patient expressed understanding and agreed to proceed.   HPI:  Acute visit for a rash, concerned for poison ivy: -started about 4 days ago after being outside and weeding -she now has a rash on her R hand and R arm, few small small dots on L arm, very itchy -started with little blisters and blistery bumps, now has scratched it -she has tried neosporin, hydrocortisone cr, triamcinolone cr, blue star ointment -now some swelling around a few lesions that she scratched a lot on the R hand -no purulent drainage or much crusting, fevers, malaise, lesion on face or new the eyes  She also has had a lot of anxiety recently. Grown children lost jobs, COVID and working from home are factors. She denies severe symptoms or depression.  ROS: See pertinent positives and negatives per HPI.  Past Medical History:  Diagnosis Date  . Anemia   . Anxiety   . Chronic urticaria 11/23/2018  . Complication of anesthesia    Blood pressure drops low  . Environmental and seasonal allergies   . GERD (gastroesophageal reflux disease)   . Migraine   . OA (osteoarthritis)   . PONV (postoperative nausea and vomiting)   . RAYNAUDS SYNDROME 01/06/2011   Qualifier: Diagnosis of  By: Arnoldo Morale MD, Balinda Quails   . Uterine fibroid    diagnosed by OB/GYN Dr Mayra Neer  . Varicose veins of left lower extremity with both ulcer of ankle and inflammation ([place]) 07/07/2011   Referral to interventional radiology for vascular study and potential intervention   . Vertigo     Past  Surgical History:  Procedure Laterality Date  . ADENOIDECTOMY    . CESAREAN SECTION    . DENTAL SURGERY    . HYSTERECTOMY ABDOMINAL WITH SALPINGO-OOPHORECTOMY Bilateral 06/13/2019   Procedure: HYSTERECTOMY ABDOMINAL WITH SALPINGO-OOPHORECTOMY;  Surgeon: Marylynn Pearson, MD;  Location[facility];  Service: Gynecology;  Laterality: Bilateral;  . HYSTEROSCOPY    . TONSILLECTOMY    . TUBAL LIGATION      Family History  Problem Relation Age of Onset  . COPD Mother   . Hyperlipidemia Mother   . Diabetes Mother   . Cancer Father        lung, smoker  . Diabetes Father   . Stroke Father   . Colon cancer Maternal Grandmother   . Diabetes Maternal Grandmother   . Healthy Sister   . Heart Problems Brother   . Crohn's disease Son   . Ulcerative colitis Daughter     SOCIAL HX: see hpi   Current Outpatient Medications:  .  Clobetasol Prop Emollient Base 0.05 % emollient cream, Apply topically as needed., Disp: , Rfl:  .  eletriptan (RELPAX) 40 MG tablet, TAKE 1 TABLET BY MOUTH AT ONSET OF HEADACHE MAY REPEAT IN 2 HOURS IF PERSISTS OR REOCCURS, Disp: 9 tablet, Rfl: 1 .  famotidine (PEPCID) 20 MG tablet, TAKE 1 TABLET BY MOUTH TWICE A DAY, Disp: 180 tablet, Rfl: 1 .  fexofenadine (ALLEGRA) 180 MG tablet, Take 180 mg by mouth daily., Disp: , Rfl:  .  FLUoxetine (  PROZAC) 20 MG capsule, Take 1 capsule (20 mg total) by mouth daily., Disp: 90 capsule, Rfl: 1 .  fluticasone (FLONASE) 50 MCG/ACT nasal spray, Place 2 sprays into both nostrils daily., Disp: 16 g, Rfl: 6 .  meclizine (ANTIVERT) 12.5 MG tablet, Take 0.5-1 tablets (6.25-12.5 mg total) by mouth 3 (three) times daily as needed for dizziness., Disp: 30 tablet, Rfl: 2 .  montelukast (SINGULAIR) 10 MG tablet, TAKE 1 TABLET BY MOUTH EVERYDAY AT BEDTIME, Disp: 90 tablet, Rfl: 1 .  ondansetron (ZOFRAN) 4 MG tablet, Take 1 tablet (4 mg total) by mouth every 8 (eight) hours as needed for nausea or vomiting (migraines)., Disp: 20  tablet, Rfl: 0 .  triamcinolone cream (KENALOG) 0.1 %, Apply 1 application topically as needed., Disp: , Rfl:  .  cephALEXin (KEFLEX) 500 MG capsule, Take 1 capsule (500 mg total) by mouth 2 (two) times daily., Disp: 10 capsule, Rfl: 0 .  hydrOXYzine (ATARAX/VISTARIL) 25 MG tablet, Take 1 tablet (25 mg total) by mouth every 8 (eight) hours as needed., Disp: 30 tablet, Rfl: 0  EXAM:  VITALS per patient if applicable:  GENERAL: alert, oriented, appears well and in no acute distress  HEENT: atraumatic, conjunttiva clear, no obvious abnormalities on inspection of external nose and ears  NECK: normal movements of the head and neck  LUNGS: on inspection no signs of respiratory distress, breathing rate appears normal, no obvious gross SOB, gasping or wheezing  CV: no obvious cyanosis  MS: moves all visible extremities without noticeable abnormality  SKIN: excoriated erythematous patches on the hands and R forearm, some redness and swelling around several lesions on the R hand, a few vesicular lesions  PSYCH/NEURO: pleasant and cooperative, no obvious depression or anxiety, speech and thought processing grossly intact  ASSESSMENT AND PLAN:  Discussed the following assessment and plan:  Rash  Cellulitis of upper extremity, unspecified laterality  -we discussed possible serious and likely etiologies, options for evaluation and workup, limitations of telemedicine visit vs in person visit, treatment, treatment risks and precautions. Pt prefers to treat via telemedicine empirically rather then risking or undertaking an in person visit at this moment. 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 Opted for treatment with topical triamcinilone cr bid which she has at home and she check to ensure is not expire, also keflex 500mg  bid for 5 days and Atarax fo intense pruritis prn. Discussed treatment options for anxiety. She opted to continue her  SSRI and consider CBT - number given for [place] for her to call to schedule. Follow up in 3 months, sooner as needed.Patient agrees to seek prompt medical care if worsening, new symptoms arise, or if is not improving with treatment.   I discussed the assessment and treatment plan with the patient. The patient was provided an opportunity to ask questions and all were answered. The patient agreed with the plan and demonstrated an understanding of the instructions.   The patient was advised to call back or seek an in-person evaluation if the symptoms worsen or if the condition fails to improve as anticipated.   Lucretia Kern, DO

## 2019-10-31 NOTE — Telephone Encounter (Signed)
Left a detailed message stating an appointment is needed in this case.  I asked that the pt call the office today to schedule a virtual visit or phone visit with another provider as Dr Ethlyn Gallery is out of the office today.

## 2019-10-31 NOTE — Patient Instructions (Signed)
-  I sent the medication(s) we discussed to your pharmacy: Meds ordered this encounter  Medications  . hydrOXYzine (ATARAX/VISTARIL) 25 MG tablet    Sig: Take 1 tablet (25 mg total) by mouth every 8 (eight) hours as needed.    Dispense:  30 tablet    Refill:  0  . cephALEXin (KEFLEX) 500 MG capsule    Sig: Take 1 capsule (500 mg total) by mouth 2 (two) times daily.    Dispense:  10 capsule    Refill:  0    Please let us know if you have any questions or concerns regarding this prescription.  Call the number provided for help with Anxiety.  Please follow up in 3 months, sooner as needed.  I hope you are feeling better soon! Seek care promptly if your symptoms worsen, new concerns arise or you are not improving with treatment.

## 2019-11-07 ENCOUNTER — Encounter: Payer: BC Managed Care – PPO | Admitting: Physical Therapy

## 2019-11-18 ENCOUNTER — Other Ambulatory Visit: Payer: Self-pay | Admitting: Family Medicine

## 2019-11-20 NOTE — Telephone Encounter (Signed)
Last OV 10/31/19 Last refill 05/30/19 #20/0 Next OV not scheduled

## 2019-11-20 NOTE — Telephone Encounter (Signed)
Last OV 10/31/19 Last refill Fluoxetine 07/17/19 #90/1                 Fluticasone 06/05/19 #16 g / 6 Next OV not scheduled

## 2019-11-26 ENCOUNTER — Ambulatory Visit: Payer: BC Managed Care – PPO

## 2019-12-10 DIAGNOSIS — L501 Idiopathic urticaria: Secondary | ICD-10-CM | POA: Diagnosis not present

## 2019-12-23 ENCOUNTER — Telehealth: Payer: Self-pay | Admitting: Family

## 2019-12-23 DIAGNOSIS — R21 Rash and other nonspecific skin eruption: Secondary | ICD-10-CM

## 2019-12-23 NOTE — Progress Notes (Signed)
Based on what you shared with me, I feel your condition warrants further evaluation and I recommend that you be seen for a face to face office visit.  Given that this is a chronic problem and you need to be seen face to face with your allergen or dermatologists.    NOTE: If you entered your credit card information for this eVisit, you will not be charged. You may see a "hold" on your card for the $35 but that hold will drop off and you will not have a charge processed.   If you are having a true medical emergency please call 911.      For an urgent face to face visit, [facility] has five urgent care centers for your convenience:      NEW:  [facility] [place] [facility] [place] Get Driving Directions [id number] [place], [place] [address] . 10 am - 6pm Monday - Friday    [facility] [place] [facility]) Get Driving Directions [id number] [address] [place], [place] [address] . 10 am to 8 pm Monday-Friday . 12 pm to 8 pm [facility] Get Driving Directions [id number] [place], [place], [place] [address] . 8 am to 8 pm Monday-Friday . 9 am to 6 pm Saturday . 11 am to 6 pm Sunday     [facility] Get Driving Directions  [id number] [address] [place], [place] [address] . 8 am to 8 pm Monday-Friday . 8 am to 4 pm [facility] at [place] Get Driving Directions [id number] [place], [place] [address] . 12 pm to 6 pm Monday-Friday      Your e-visit answers were reviewed by a board certified advanced clinical practitioner to complete your personal care plan.  Thank you for using e-Visits.

## 2019-12-23 NOTE — Progress Notes (Signed)
Virtual Visit via Video Note  I connected with Crystal Dillon on 12/24/19 at  2:15 PM EST by a video enabled telemedicine application and verified that I am speaking with the correct person using two identifiers.  Location: Patient: Home Provider: Clinic  This service was conducted via virtual visit.  Both audio and visual tools were used.  The patient was located at home. I was located in my office.  Consent was obtained prior to the virtual visit and is aware of possible charges through their insurance for this visit.  The patient is an established patient.  Dr. Estanislado Pandy, MD conducted the virtual visit and Hazel Sams, PA-C acted as scribe during the service.  Office staff helped with scheduling follow up visits after the service was conducted.     I discussed the limitations of evaluation and management by telemedicine and the availability of in person appointments. The patient expressed understanding and agreed to proceed.  CC: Hives  History of Present Illness: Crystal Dillon is a 54 y.o. female with history of urticaria and Raynaud's phenomenon. She has ongoing chronic urticaria for over 1 year.  The hives tend to be on her arms, legs, and hands.  She has developed scaring as a result of the excoriations.  She has had an inadequate response to famotidine, montelukast, triamcinolone cream, and clobetasol.  She was evaluated by Dr. Marcelline Deist on 12/10/19, and he recommended starting on cyclosporine 25 mg BID for chronic idiopathic urticaria but her insurance did not approve it.   She has ongoing symptoms of Raynaud's.  She has persistent pain in both hands but no joint swelling.  She denies any knee joint pain or feet pain at this time. She states that the plantar fasciitis has resolved.   Review of Systems  Constitutional: Negative for fever and malaise/fatigue.  HENT: Negative for congestion.   Eyes: Negative for photophobia, pain, discharge and redness.  Respiratory: Negative for cough,  shortness of breath and wheezing.   Cardiovascular: Negative for chest pain, palpitations and leg swelling.  Gastrointestinal: Negative for blood in stool, constipation and diarrhea.  Genitourinary: Negative for dysuria and frequency.  Musculoskeletal: Positive for joint pain. Negative for back pain, myalgias and neck pain.  Skin: Positive for rash.  Neurological: Negative for dizziness, weakness and headaches.  Endo/Heme/Allergies: Does not bruise/bleed easily.  Psychiatric/Behavioral: Negative for depression and memory loss. The patient is not nervous/anxious and does not have insomnia.      Observations/Objective: Physical Exam  Constitutional: She is oriented to person, place, and time and well-developed, well-nourished, and in no distress.  HENT:  Head: Normocephalic and atraumatic.  Eyes: Conjunctivae are normal.  Pulmonary/Chest: Effort normal.  Neurological: She is alert and oriented to person, place, and time.  Psychiatric: Mood, memory, affect and judgment normal.   Patient reports morning stiffness for  0  NONE.   Patient denies nocturnal pain.  Difficulty dressing/grooming: Denies Difficulty climbing stairs: Denies Difficulty getting out of chair: Denies Difficulty using hands for taps, buttons, cutlery, and/or writing: Denies    Assessment and Plan: Visit Diagnoses: Positive ANA (antinuclear antibody) - AVISE index -1.9.  Patient's repeat ANA was negative.  ENA panel was negative.  All other autoimmune work-up was negative: She has ongoing symptoms of raynaud's and idiopathic urticaria, but these symptoms do not seem to be related to an underlying autoimmune condition.  She was advised to notify us if she develops any new or worsening symptoms.  We will refer her to [facility] dermatology for further  evaluation and management of chronic idiopathic urticaria. She will follow up in 1 year.  Raynaud's disease without gangrene - Per patient: She has intermittent symptoms of  Raynaud's.  No digital ulcerations or signs of gangrene.    Chronic idiopathic urticaria-She has had ongoing chronic idiopathic urticaria for over 1 year.  The hives tend to be on her arms, legs, and hands.  She has developed scaring as a result of the deep excoriations.  She has had an inadequate response to famotidine, montelukast, triamcinolone cream, and clobetasol.  She was evaluated by Dr. 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  We will refer her to [facility] for further evaluation and management to coordinate care with Dr. Marcelline Deist.    Elevated alkaline phosphatase-She was advised to have alk phos rechecked with upcoming lab work.  Primary osteoarthritis of both knees - Bilateral moderate with mild chondromalacia patella: She is not experiencing any knee joint pain or inflammation at this time.   Polyarthralgia - X-rays obtained during the last visit showed mild osteoarthritis in the hands and right foot.  She has ongoing pain in both hands but no joint inflammation.   Plantar fasciitis - Bilateral-Resolved   DDD (degenerative disc disease), lumbar-She is not experiencing any lower back pain at this time.   Other medical conditions are listed as follows:   Anxiety state  History of migraine   Follow Up Instructions: She will follow up in 1 year   I discussed the assessment and treatment plan with the patient. The patient was provided an opportunity to ask questions and all were answered. The patient agreed with the plan and demonstrated an understanding of the instructions.   The patient was advised to call back or seek an in-person evaluation if the symptoms worsen or if the condition fails to improve as anticipated.  I provided 15 minutes of non-face-to-face time during this encounter.  Bo Merino, MD   Scribed by-  Hazel Sams, PA-C

## 2019-12-24 ENCOUNTER — Telehealth (INDEPENDENT_AMBULATORY_CARE_PROVIDER_SITE_OTHER): Payer: BC Managed Care – PPO | Admitting: Rheumatology

## 2019-12-24 ENCOUNTER — Telehealth: Payer: Self-pay | Admitting: Rheumatology

## 2019-12-24 ENCOUNTER — Other Ambulatory Visit: Payer: Self-pay | Admitting: *Deleted

## 2019-12-24 ENCOUNTER — Encounter: Payer: Self-pay | Admitting: Rheumatology

## 2019-12-24 ENCOUNTER — Other Ambulatory Visit: Payer: Self-pay

## 2019-12-24 DIAGNOSIS — M722 Plantar fascial fibromatosis: Secondary | ICD-10-CM

## 2019-12-24 DIAGNOSIS — Z8669 Personal history of other diseases of the nervous system and sense organs: Secondary | ICD-10-CM

## 2019-12-24 DIAGNOSIS — R768 Other specified abnormal immunological findings in serum: Secondary | ICD-10-CM

## 2019-12-24 DIAGNOSIS — L501 Idiopathic urticaria: Secondary | ICD-10-CM

## 2019-12-24 DIAGNOSIS — M5136 Other intervertebral disc degeneration, lumbar region: Secondary | ICD-10-CM

## 2019-12-24 DIAGNOSIS — R748 Abnormal levels of other serum enzymes: Secondary | ICD-10-CM

## 2019-12-24 DIAGNOSIS — M19071 Primary osteoarthritis, right ankle and foot: Secondary | ICD-10-CM

## 2019-12-24 DIAGNOSIS — F411 Generalized anxiety disorder: Secondary | ICD-10-CM

## 2019-12-24 DIAGNOSIS — I7300 Raynaud's syndrome without gangrene: Secondary | ICD-10-CM

## 2019-12-24 DIAGNOSIS — M170 Bilateral primary osteoarthritis of knee: Secondary | ICD-10-CM

## 2019-12-24 DIAGNOSIS — M19041 Primary osteoarthritis, right hand: Secondary | ICD-10-CM

## 2019-12-24 DIAGNOSIS — M19042 Primary osteoarthritis, left hand: Secondary | ICD-10-CM

## 2019-12-24 NOTE — Telephone Encounter (Signed)
Patient left a voicemail requesting to speak with you directly prior to her virtual appointment today at 2:15 pm.

## 2019-12-24 NOTE — Telephone Encounter (Signed)
I called patient 

## 2019-12-30 DIAGNOSIS — G43009 Migraine without aura, not intractable, without status migrainosus: Secondary | ICD-10-CM | POA: Diagnosis not present

## 2019-12-30 DIAGNOSIS — K219 Gastro-esophageal reflux disease without esophagitis: Secondary | ICD-10-CM | POA: Diagnosis not present

## 2019-12-30 DIAGNOSIS — E538 Deficiency of other specified B group vitamins: Secondary | ICD-10-CM | POA: Diagnosis not present

## 2019-12-30 DIAGNOSIS — R7309 Other abnormal glucose: Secondary | ICD-10-CM | POA: Diagnosis not present

## 2019-12-31 ENCOUNTER — Encounter: Payer: Self-pay | Admitting: Allergy

## 2019-12-31 ENCOUNTER — Other Ambulatory Visit: Payer: Self-pay

## 2019-12-31 ENCOUNTER — Ambulatory Visit (INDEPENDENT_AMBULATORY_CARE_PROVIDER_SITE_OTHER): Payer: BC Managed Care – PPO | Admitting: Allergy

## 2019-12-31 DIAGNOSIS — Z13220 Encounter for screening for lipoid disorders: Secondary | ICD-10-CM | POA: Diagnosis not present

## 2019-12-31 DIAGNOSIS — L508 Other urticaria: Secondary | ICD-10-CM

## 2019-12-31 DIAGNOSIS — L501 Idiopathic urticaria: Secondary | ICD-10-CM

## 2019-12-31 DIAGNOSIS — R5383 Other fatigue: Secondary | ICD-10-CM | POA: Diagnosis not present

## 2019-12-31 DIAGNOSIS — I7300 Raynaud's syndrome without gangrene: Secondary | ICD-10-CM | POA: Diagnosis not present

## 2019-12-31 MED ORDER — OMALIZUMAB 150 MG/ML ~~LOC~~ SOSY
300.0000 mg | PREFILLED_SYRINGE | SUBCUTANEOUS | Status: DC
  Administered 2019-12-31: 300 mg via SUBCUTANEOUS

## 2019-12-31 MED ORDER — EPINEPHRINE 0.3 MG/0.3ML IJ SOAJ: INTRAMUSCULAR | 1 refills | Status: AC

## 2019-12-31 NOTE — Patient Instructions (Signed)
Chronic hives   - labwork done at initial visit shows that you make autoantibodies that target your allergy cells leading to chronic hives.  This is an autoimmune form of hives.  Hives can be longer lasting and harder treat.  You have not had much improvement with use of antihistamine regimen thus at this time recommend proceeding with Xolair injections   - until Xolair has change to take effect would continue high-dose antihistamine regimen as follows:     - Allegra 180mg  1 tablet in AM,  Xyzal 5mg  1 tablet in PM, Pepcid 20mg  1 tablet twice a day and Singulair 10mg  daily   - Xolair monthly injections discussed today including benefits, risks and dosing schedule.  Decision made to proceed with this therapeutic option.  Sample was provided in office today to get started.  Next dose in 4 weeks.  Will prescribe an epinephrine device to bring on days of injections.     - keep skin moisturized to help decrease itch  Follow-up 3 months or sooner if needed

## 2019-12-31 NOTE — Progress Notes (Signed)
Follow-up Note  RE: Akasha Fels MRN: [id number] DOB: [birth year] Date of Office Visit: 12/31/2019   History of present illness: Taraya Liebel is a 54 y.o. female presenting today for follow-up of chronic urticaria.  She was last seen in the office on 11/07/2018 by myself.  In the interim she did see Dr. Marcelline Deist with [facility] allergy who recommended that she start on cyclosporine to help with hives control since she was not having any improvement with a antihistamine regimen.  Her insurance would not cover this that she has not started on this at this time.  She states she is continued to have worsening hives that is very frustrating for her.  She currently is taking Allegra 1 tablet a day, Pepcid 1 tablet a day and Singulair once a day.  She initially did start both Allegra and Pepcid twice a day was not feel that this was making any difference that she wean back to the daily dosing. At her last visit we did obtain a hive work-up that did show that she is positive for auto antibodies to the mast cell receptor thus indicating that she has autoimmune form of hives.  She does have other autoimmune disease and does follow with a rheumatologist with a positive ANA, Raynaud's disease, polyarthralgia, primary osteoarthritis of the knees.   She has not yet been evaluated by dermatology at this time for hives.  Review of systems: Review of Systems  Constitutional: Negative for chills, fever and malaise/fatigue.  HENT: Negative.   Eyes: Negative.   Respiratory: Negative.   Gastrointestinal: Negative.   Musculoskeletal: Positive for joint pain.  Skin: Positive for itching and rash.  Neurological: Negative.     All other systems negative unless noted above in HPI  Past medical/social/surgical/family history have been reviewed and are unchanged unless specifically indicated below.  No changes  Medication List: Current Outpatient Medications  Medication Sig Dispense Refill  . clobetasol  cream (TEMOVATE) 0.05 % Apply topically.    . Clobetasol Prop Emollient Base 0.05 % emollient cream Apply topically as needed.    . Clobetasol Propionate 0.025 % CREA     . Cobalamin Combinations (B-12) [phone] MCG SUBL     . eletriptan (RELPAX) 40 MG tablet TAKE 1 TABLET BY MOUTH AT ONSET OF HEADACHE MAY REPEAT IN 2 HOURS IF PERSISTS OR REOCCURS 9 tablet 1  . famotidine (PEPCID) 20 MG tablet TAKE 1 TABLET BY MOUTH TWICE A DAY 180 tablet 1  . fexofenadine (ALLEGRA) 180 MG tablet Take 180 mg by mouth daily.    Marland Kitchen FLUoxetine (PROZAC) 20 MG capsule TAKE 1 CAPSULE BY MOUTH EVERY DAY 90 capsule 1  . fluticasone (FLONASE) 50 MCG/ACT nasal spray SPRAY 2 SPRAYS INTO EACH NOSTRIL EVERY DAY 48 mL 1  . gabapentin (NEURONTIN) 100 MG capsule Take by mouth.    . meclizine (ANTIVERT) 12.5 MG tablet Take 0.5-1 tablets (6.25-12.5 mg total) by mouth 3 (three) times daily as needed for dizziness. 30 tablet 2  . montelukast (SINGULAIR) 10 MG tablet TAKE 1 TABLET BY MOUTH EVERYDAY AT BEDTIME 90 tablet 1  . Multiple Vitamins-Minerals (ONE-A-DAY WOMENS 50+ ADVANTAGE) TABS     . ondansetron (ZOFRAN) 4 MG tablet TAKE 1 TABLET BY MOUTH EVERY 8 HOURS AS NEEDED FOR NAUSEA OR VOMITING (MIGRAINES). 20 tablet 0  . triamcinolone cream (KENALOG) 0.1 % Apply 1 application topically as needed.    . hydrOXYzine (ATARAX/VISTARIL) 25 MG tablet Take 1 tablet (25 mg total) by mouth every 8 (  eight) hours as needed. (Patient not taking: Reported on 12/31/2019) 30 tablet 0   Current Facility-Administered Medications  Medication Dose Route Frequency Provider Last Rate Last Admin  . omalizumab Arvid Right) prefilled syringe 300 mg  300 mg Subcutaneous Q28 days Kennith Gain, MD   300 mg at 12/31/19 1630     Known medication allergies: No Known Allergies   Physical examination: Blood pressure 110/70, pulse 69, temperature 98.3 F (36.8 C), temperature source Temporal, resp. rate 12, height 5\' 1"  (1.549 m), weight 118 lb 2.7 oz  (53.6 kg), last menstrual period 07/06/2011, SpO2 97 %.  General: Alert, interactive, in no acute distress. HEENT: PERRLA, TMs pearly gray, turbinates non-edematous without discharge, post-pharynx non erythematous. Neck: Supple without lymphadenopathy. Lungs: Clear to auscultation without wheezing, rhonchi or rales. {no increased work of breathing. CV: Normal S1, S2 without murmurs. Abdomen: Nondistended, nontender. Skin: Scattered erythematous urticarial type lesions primarily located arms b/l , nonvesicular. Extremities:  No clubbing, cyanosis or edema. Neuro:   Grossly intact.  Diagnositics/Labs: Labs:  Component     Latest Ref Rng & Units 11/07/2018  IgE (Immunoglobulin E), Serum     6 - 495 IU/mL 16  D Pteronyssinus IgE     Class 0 kU/L <0.10  D Farinae IgE     Class 0 kU/L <0.10  Cat Dander IgE     Class 0 kU/L <0.10  Dog Dander IgE     Class 0 kU/L <0.10  [place] Grass IgE     Class 0 kU/L <0.10  Timothy Grass IgE     Class 0 kU/L <0.10  Johnson Grass IgE     Class 0 kU/L <0.10  Bahia Grass IgE     Class 0 kU/L <0.10  Cockroach, American IgE     Class 0 kU/L <0.10  Penicillium Chrysogen IgE     Class 0 kU/L <0.10  Cladosporium Herbarum IgE     Class 0 kU/L <0.10  Aspergillus Fumigatus IgE     Class 0 kU/L <0.10  Mucor Racemosus IgE     Class 0 kU/L <0.10  Alternaria Alternata IgE     Class 0 kU/L <0.10  Stemphylium Herbarum IgE     Class 0 kU/L <0.10  Common Silver Wendee Copp IgE     Class 0 kU/L <0.10  Oak, White IgE     Class 0 kU/L <0.10  Elm, American IgE     Class 0 kU/L <0.10  Maple/Box Elder IgE     Class 0 kU/L <0.10  Hickory, White IgE     Class 0 kU/L <0.10  Amer Sycamore IgE Qn     Class 0 kU/L <0.10  White Mulberry IgE     Class 0 kU/L <0.10  Sweet gum IgE RAST Ql     Class 0 kU/L <0.10  Cedar, [place] IgE     Class 0 kU/L <0.10  Ragweed, Short IgE     Class 0 kU/L <0.10  Mugwort IgE Qn     Class 0 kU/L <0.10  Plantain, English  IgE     Class 0 kU/L <0.10  Pigweed, Rough IgE     Class 0 kU/L <0.10  Sheep Sorrel IgE Qn     Class 0 kU/L <0.10  Nettle IgE     Class 0 kU/L <0.10  WBC     3.4 - 10.8 x10E3/uL 5.0  RBC     3.77 - 5.28 x10E6/uL 4.36  Hemoglobin     11.1 - 15.9 g/dL  12.8  HCT     34.0 - 46.6 % 38.8  MCV     79 - 97 fL 89  MCH     26.6 - 33.0 pg 29.4  MCHC     31.5 - 35.7 g/dL 33.0  RDW     12.3 - 15.4 % 13.0  Neutrophils     Not Estab. % 52  Lymphs     Not Estab. % 39  Monocytes     Not Estab. % 7  Eos     Not Estab. % 1  Basos     Not Estab. % 1  NEUT#     1.4 - 7.0 x10E3/uL 2.6  Lymphocyte #     0.7 - 3.1 x10E3/uL 1.9  Monocytes Absolute     0.1 - 0.9 x10E3/uL 0.4  EOS (ABSOLUTE)     0.0 - 0.4 x10E3/uL 0.1  Basophils Absolute     0.0 - 0.2 x10E3/uL 0.1  Immature Granulocytes     Not Estab. % 0  Immature Grans (Abs)     0.0 - 0.1 x10E3/uL 0.0  Glucose     65 - 99 mg/dL 114 (H)  BUN     6 - 24 mg/dL 14  Creatinine     0.57 - 1.00 mg/dL 0.71  GFR, Est Non African American     >59 mL/min/1.73 97  GFR, Est African American     >59 mL/min/1.73 112  BUN/Creatinine Ratio     9 - 23 20  Sodium     134 - 144 mmol/L 138  Potassium     3.5 - 5.2 mmol/L 4.4  Chloride     96 - 106 mmol/L 101  CO2     20 - 29 mmol/L 22  Calcium     8.7 - 10.2 mg/dL 9.7  Total Protein     6.0 - 8.5 g/dL 7.4  Albumin     3.5 - 5.5 g/dL 4.9  Globulin, Total     1.5 - 4.5 g/dL 2.5  Albumin/Globulin Ratio     1.2 - 2.2 2.0  Total Bilirubin     0.0 - 1.2 mg/dL 0.3  Alkaline Phosphatase     39 - 117 IU/L 173 (H)  AST     0 - 40 IU/L 17  ALT     0 - 32 IU/L 18  Beef (Bos spp) IgE     <0.35 kU/L <0.10  Class Interpretation      0  Lamb/Mutton (Ovis spp) IgE     <0.35 kU/L <0.10  Class Interpretation      0  Pork (Sus spp) IgE     <0.35 kU/L <0.10  Class Interpretation      0  Alpha Gal IgE*     <0.10 kU/L <0.10  Thyroperoxidase Ab SerPl-aCnc     0 - 34 IU/mL <6   Thyroglobulin Antibody     0.0 - 0.9 IU/mL <1.0  Sed Rate     0 - 40 mm/hr 15  Tryptase     2.2 - 13.2 ug/L 2.8  Anti Nuclear Antibody (ANA)     Negative Positive (A)  cu index     <10 31.8 (H)     Assessment and plan:   Chronic hives   - labwork done at initial visit shows that you make autoantibodies that target your allergy cells leading to chronic hives.  This is an autoimmune form of hives.  Hives can be longer lasting and  harder treat.  You have not had much improvement with use of antihistamine regimen thus at this time recommend proceeding with Xolair injections   - until Xolair has change to take effect would continue high-dose antihistamine regimen as follows:     - Allegra 180mg  1 tablet in AM,  Xyzal 5mg  1 tablet in PM, Pepcid 20mg  1 tablet twice a day and Singulair 10mg  daily   - Xolair monthly injections discussed today including benefits, risks and dosing schedule.  Decision made to proceed with this therapeutic option.  Sample was provided in office today to get started.  Next dose in 4 weeks.  Will prescribe an epinephrine device to bring on days of injections.     - keep skin moisturized to help decrease itch  Follow-up 3 months or sooner if needed    No follow-ups on file.  I appreciate the opportunity to take part in Srishti's care. Please do not hesitate to contact me with questions.  Sincerely,   Prudy Feeler, MD Allergy/Immunology [facility]

## 2020-01-01 ENCOUNTER — Telehealth: Payer: Self-pay | Admitting: Rheumatology

## 2020-01-01 DIAGNOSIS — R748 Abnormal levels of other serum enzymes: Secondary | ICD-10-CM

## 2020-01-01 NOTE — Telephone Encounter (Signed)
FYI: Patient wanted to let you know her PCP has put her on Zolair injections, and Gabapentin. He also repeated lab work, and her alkaline and ANA levels are still elevated.

## 2020-01-02 ENCOUNTER — Telehealth: Payer: Self-pay | Admitting: *Deleted

## 2020-01-02 NOTE — Telephone Encounter (Signed)
Called and dicussed Xolair with patient she had received sample dose on 11/12.   I have obtained Ins approval and copay cad and we will buy and bill medication thru her Ins. Advised patient if any future In changes she should let us know asap

## 2020-01-02 NOTE — Telephone Encounter (Signed)
I detailed discussion with the patient.  She states she has not developed any new clinical features of autoimmune disease.  Her ANA has been positive in the past and AVISE labs were negative.  I also discussed elevated alkaline phosphatase with her.  She would prefer to have total body bone scan.  Please a scheduled total body bone scan.

## 2020-01-02 NOTE — Telephone Encounter (Signed)
Please place order for Alk phos isoenzymes.

## 2020-01-02 NOTE — Telephone Encounter (Signed)
Please get the lab results.

## 2020-01-02 NOTE — Telephone Encounter (Signed)
Labs are available in [place].   Labs: 12/31/2019 positive ANA, alk phos 153.

## 2020-01-03 NOTE — Telephone Encounter (Signed)
Orders placed.

## 2020-01-03 NOTE — Addendum Note (Signed)
Addended by: Carole Binning on: 01/03/2020 09:30 AM   Modules accepted: Orders

## 2020-01-16 ENCOUNTER — Ambulatory Visit: Payer: BLUE CROSS/BLUE SHIELD | Admitting: Allergy

## 2020-01-16 DIAGNOSIS — R6883 Chills (without fever): Secondary | ICD-10-CM | POA: Diagnosis not present

## 2020-01-16 DIAGNOSIS — R519 Headache, unspecified: Secondary | ICD-10-CM | POA: Diagnosis not present

## 2020-01-16 DIAGNOSIS — U071 COVID-19: Secondary | ICD-10-CM | POA: Diagnosis not present

## 2020-01-17 ENCOUNTER — Other Ambulatory Visit: Payer: Self-pay | Admitting: Family Medicine

## 2020-01-20 DIAGNOSIS — Z03818 Encounter for observation for suspected exposure to other biological agents ruled out: Secondary | ICD-10-CM | POA: Diagnosis not present

## 2020-01-20 DIAGNOSIS — U071 COVID-19: Secondary | ICD-10-CM | POA: Diagnosis not present

## 2020-01-20 DIAGNOSIS — Z20828 Contact with and (suspected) exposure to other viral communicable diseases: Secondary | ICD-10-CM | POA: Diagnosis not present

## 2020-01-20 HISTORY — DX: COVID-19: U07.1

## 2020-01-21 ENCOUNTER — Ambulatory Visit: Payer: BC Managed Care – PPO

## 2020-01-22 DIAGNOSIS — R509 Fever, unspecified: Secondary | ICD-10-CM | POA: Diagnosis not present

## 2020-01-22 DIAGNOSIS — R110 Nausea: Secondary | ICD-10-CM | POA: Diagnosis not present

## 2020-01-22 DIAGNOSIS — R52 Pain, unspecified: Secondary | ICD-10-CM | POA: Diagnosis not present

## 2020-01-22 DIAGNOSIS — U071 COVID-19: Secondary | ICD-10-CM | POA: Diagnosis not present

## 2020-01-27 DIAGNOSIS — I959 Hypotension, unspecified: Secondary | ICD-10-CM | POA: Diagnosis not present

## 2020-01-27 DIAGNOSIS — G43909 Migraine, unspecified, not intractable, without status migrainosus: Secondary | ICD-10-CM | POA: Diagnosis not present

## 2020-01-27 DIAGNOSIS — R109 Unspecified abdominal pain: Secondary | ICD-10-CM | POA: Diagnosis not present

## 2020-01-27 DIAGNOSIS — R42 Dizziness and giddiness: Secondary | ICD-10-CM | POA: Diagnosis not present

## 2020-01-27 DIAGNOSIS — R55 Syncope and collapse: Secondary | ICD-10-CM | POA: Diagnosis not present

## 2020-01-27 DIAGNOSIS — J189 Pneumonia, unspecified organism: Secondary | ICD-10-CM | POA: Diagnosis not present

## 2020-01-27 DIAGNOSIS — E860 Dehydration: Secondary | ICD-10-CM | POA: Diagnosis not present

## 2020-01-27 DIAGNOSIS — R05 Cough: Secondary | ICD-10-CM | POA: Diagnosis not present

## 2020-01-27 DIAGNOSIS — K7689 Other specified diseases of liver: Secondary | ICD-10-CM | POA: Diagnosis not present

## 2020-01-27 DIAGNOSIS — J984 Other disorders of lung: Secondary | ICD-10-CM | POA: Diagnosis not present

## 2020-01-27 DIAGNOSIS — R0602 Shortness of breath: Secondary | ICD-10-CM | POA: Diagnosis not present

## 2020-01-27 DIAGNOSIS — Z79899 Other long term (current) drug therapy: Secondary | ICD-10-CM | POA: Diagnosis not present

## 2020-01-27 DIAGNOSIS — R918 Other nonspecific abnormal finding of lung field: Secondary | ICD-10-CM | POA: Diagnosis not present

## 2020-01-27 DIAGNOSIS — R110 Nausea: Secondary | ICD-10-CM | POA: Diagnosis not present

## 2020-01-27 DIAGNOSIS — U071 COVID-19: Secondary | ICD-10-CM | POA: Diagnosis not present

## 2020-01-27 DIAGNOSIS — J1282 Pneumonia due to coronavirus disease 2019: Secondary | ICD-10-CM | POA: Diagnosis not present

## 2020-01-27 DIAGNOSIS — N281 Cyst of kidney, acquired: Secondary | ICD-10-CM | POA: Diagnosis not present

## 2020-01-29 DIAGNOSIS — Z09 Encounter for follow-up examination after completed treatment for conditions other than malignant neoplasm: Secondary | ICD-10-CM | POA: Diagnosis not present

## 2020-01-29 DIAGNOSIS — U071 COVID-19: Secondary | ICD-10-CM | POA: Diagnosis not present

## 2020-01-29 DIAGNOSIS — K219 Gastro-esophageal reflux disease without esophagitis: Secondary | ICD-10-CM | POA: Diagnosis not present

## 2020-01-29 DIAGNOSIS — L509 Urticaria, unspecified: Secondary | ICD-10-CM | POA: Diagnosis not present

## 2020-01-31 ENCOUNTER — Other Ambulatory Visit: Payer: Self-pay | Admitting: Family Medicine

## 2020-02-03 ENCOUNTER — Ambulatory Visit: Payer: BC Managed Care – PPO

## 2020-02-06 DIAGNOSIS — M549 Dorsalgia, unspecified: Secondary | ICD-10-CM | POA: Diagnosis not present

## 2020-02-06 DIAGNOSIS — U071 COVID-19: Secondary | ICD-10-CM | POA: Diagnosis not present

## 2020-02-06 DIAGNOSIS — J189 Pneumonia, unspecified organism: Secondary | ICD-10-CM | POA: Diagnosis not present

## 2020-02-06 DIAGNOSIS — R000 Tachycardia, unspecified: Secondary | ICD-10-CM | POA: Diagnosis not present

## 2020-02-09 ENCOUNTER — Other Ambulatory Visit: Payer: Self-pay

## 2020-02-09 ENCOUNTER — Emergency Department (HOSPITAL_COMMUNITY): Payer: BC Managed Care – PPO

## 2020-02-09 ENCOUNTER — Encounter (HOSPITAL_COMMUNITY): Payer: Self-pay | Admitting: Internal Medicine

## 2020-02-09 ENCOUNTER — Inpatient Hospital Stay (HOSPITAL_COMMUNITY)
Admission: EM | Admit: 2020-02-09 | Discharge: 2020-02-10 | DRG: 176 | Disposition: A | Discharge status: Home / Self Care | Payer: BC Managed Care – PPO | Attending: Internal Medicine | Admitting: Internal Medicine

## 2020-02-09 DIAGNOSIS — L509 Urticaria, unspecified: Secondary | ICD-10-CM | POA: Diagnosis present

## 2020-02-09 DIAGNOSIS — R0602 Shortness of breath: Secondary | ICD-10-CM | POA: Diagnosis not present

## 2020-02-09 DIAGNOSIS — Z825 Family history of asthma and other chronic lower respiratory diseases: Secondary | ICD-10-CM | POA: Diagnosis not present

## 2020-02-09 DIAGNOSIS — I2602 Saddle embolus of pulmonary artery with acute cor pulmonale: Secondary | ICD-10-CM | POA: Diagnosis not present

## 2020-02-09 DIAGNOSIS — U071 COVID-19: Secondary | ICD-10-CM | POA: Diagnosis not present

## 2020-02-09 DIAGNOSIS — R7401 Elevation of levels of liver transaminase levels: Secondary | ICD-10-CM | POA: Diagnosis not present

## 2020-02-09 DIAGNOSIS — G43909 Migraine, unspecified, not intractable, without status migrainosus: Secondary | ICD-10-CM | POA: Diagnosis present

## 2020-02-09 DIAGNOSIS — I2699 Other pulmonary embolism without acute cor pulmonale: Secondary | ICD-10-CM | POA: Diagnosis present

## 2020-02-09 DIAGNOSIS — Z833 Family history of diabetes mellitus: Secondary | ICD-10-CM | POA: Diagnosis not present

## 2020-02-09 DIAGNOSIS — I7300 Raynaud's syndrome without gangrene: Secondary | ICD-10-CM | POA: Diagnosis not present

## 2020-02-09 DIAGNOSIS — I2609 Other pulmonary embolism with acute cor pulmonale: Secondary | ICD-10-CM | POA: Diagnosis not present

## 2020-02-09 DIAGNOSIS — K219 Gastro-esophageal reflux disease without esophagitis: Secondary | ICD-10-CM | POA: Diagnosis present

## 2020-02-09 DIAGNOSIS — Z823 Family history of stroke: Secondary | ICD-10-CM

## 2020-02-09 DIAGNOSIS — Z8349 Family history of other endocrine, nutritional and metabolic diseases: Secondary | ICD-10-CM

## 2020-02-09 DIAGNOSIS — Z800 Family history of malignant neoplasm of digestive organs: Secondary | ICD-10-CM | POA: Diagnosis not present

## 2020-02-09 DIAGNOSIS — R918 Other nonspecific abnormal finding of lung field: Secondary | ICD-10-CM | POA: Diagnosis not present

## 2020-02-09 DIAGNOSIS — B948 Sequelae of other specified infectious and parasitic diseases: Secondary | ICD-10-CM | POA: Diagnosis not present

## 2020-02-09 LAB — COMPREHENSIVE METABOLIC PANEL
ALT: 195 U/L — ABNORMAL HIGH (ref 0–44)
AST: 190 U/L — ABNORMAL HIGH (ref 15–41)
Albumin: 3.4 g/dL — ABNORMAL LOW (ref 3.5–5.0)
Alkaline Phosphatase: 262 U/L — ABNORMAL HIGH (ref 38–126)
Anion gap: 14 (ref 5–15)
BUN: 7 mg/dL (ref 6–20)
CO2: 25 mmol/L (ref 22–32)
Calcium: 9.9 mg/dL (ref 8.9–10.3)
Chloride: 100 mmol/L (ref 98–111)
Creatinine, Ser: 0.71 mg/dL (ref 0.44–1.00)
GFR calc Af Amer: >60 mL/min (ref >60)
GFR calc non Af Amer: >60 mL/min (ref >60)
Glucose, Bld: 117 mg/dL — ABNORMAL HIGH (ref 70–99)
Potassium: 3.8 mmol/L (ref 3.5–5.1)
Sodium: 139 mmol/L (ref 135–145)
Total Bilirubin: 0.9 mg/dL (ref 0.3–1.2)
Total Protein: 7.7 g/dL (ref 6.5–8.1)

## 2020-02-09 LAB — CBC WITH DIFFERENTIAL/PLATELET
Abs Immature Granulocytes: 0.03 10*3/uL (ref 0.00–0.07)
Basophils Absolute: 0.1 10*3/uL (ref 0.0–0.1)
Basophils Relative: 1 %
Eosinophils Absolute: 0 10*3/uL (ref 0.0–0.5)
Eosinophils Relative: 0 %
HCT: 38.3 % (ref 36.0–46.0)
Hemoglobin: 12.3 g/dL (ref 12.0–15.0)
Immature Granulocytes: 1 %
Lymphocytes Relative: 17 %
Lymphs Abs: 1.1 10*3/uL (ref 0.7–4.0)
MCH: 29 pg (ref 26.0–34.0)
MCHC: 32.1 g/dL (ref 30.0–36.0)
MCV: 90.3 fL (ref 80.0–100.0)
Monocytes Absolute: 0.6 10*3/uL (ref 0.1–1.0)
Monocytes Relative: 9 %
Neutro Abs: 4.7 10*3/uL (ref 1.7–7.7)
Neutrophils Relative %: 72 %
Platelets: 318 10*3/uL (ref 150–400)
RBC: 4.24 MIL/uL (ref 3.87–5.11)
RDW: 13.6 % (ref 11.5–15.5)
WBC: 6.4 10*3/uL (ref 4.0–10.5)
nRBC: 0 % (ref 0.0–0.2)

## 2020-02-09 LAB — D-DIMER, QUANTITATIVE: D-Dimer, Quant: 2.52 ug/mL-FEU — ABNORMAL HIGH (ref 0.00–0.50)

## 2020-02-09 LAB — TROPONIN I (HIGH SENSITIVITY)
Troponin I (High Sensitivity): 3 ng/L (ref <18)
Troponin I (High Sensitivity): 3 ng/L (ref <18)

## 2020-02-09 LAB — BRAIN NATRIURETIC PEPTIDE: B Natriuretic Peptide: 26.2 pg/mL (ref 0.0–100.0)

## 2020-02-09 LAB — HEPARIN LEVEL (UNFRACTIONATED): Heparin Unfractionated: 0.35 IU/mL (ref 0.30–0.70)

## 2020-02-09 LAB — SARS CORONAVIRUS 2 (TAT 6-24 HRS): SARS Coronavirus 2: POSITIVE — AB

## 2020-02-09 MED ORDER — HEPARIN BOLUS VIA INFUSION
3000.0000 [IU] | Freq: Once | INTRAVENOUS | Status: AC
  Administered 2020-02-09: 13:00:00 3000 [IU] via INTRAVENOUS
  Filled 2020-02-09: qty 3000

## 2020-02-09 MED ORDER — IOHEXOL 350 MG/ML SOLN
100.0000 mL | Freq: Once | INTRAVENOUS | Status: AC | PRN
  Administered 2020-02-09: 45 mL via INTRAVENOUS

## 2020-02-09 MED ORDER — FENTANYL CITRATE (PF) 100 MCG/2ML IJ SOLN
50.0000 ug | Freq: Once | INTRAMUSCULAR | Status: AC
  Administered 2020-02-09: 15:00:00 50 ug via INTRAVENOUS
  Filled 2020-02-09: qty 2

## 2020-02-09 MED ORDER — ENSURE ENLIVE PO LIQD
237.0000 mL | Freq: Two times a day (BID) | ORAL | Status: DC
  Administered 2020-02-10 (×2): 237 mL via ORAL

## 2020-02-09 MED ORDER — ACETAMINOPHEN 325 MG PO TABS
650.0000 mg | ORAL_TABLET | Freq: Four times a day (QID) | ORAL | Status: DC | PRN
  Filled 2020-02-09: qty 2

## 2020-02-09 MED ORDER — ACETAMINOPHEN 650 MG RE SUPP: 650.0000 mg | Freq: Four times a day (QID) | RECTAL | Status: DC | PRN

## 2020-02-09 MED ORDER — HEPARIN (PORCINE) 25000 UT/250ML-% IV SOLN
950.0000 [IU]/h | INTRAVENOUS | Status: AC
  Administered 2020-02-09: 850 [IU]/h via INTRAVENOUS
  Administered 2020-02-10: 13:00:00 950 [IU]/h via INTRAVENOUS
  Filled 2020-02-09 (×2): qty 250

## 2020-02-09 MED ORDER — IBUPROFEN 600 MG PO TABS: 600.0000 mg | ORAL_TABLET | Freq: Four times a day (QID) | ORAL | Status: DC | PRN

## 2020-02-09 MED ORDER — OXYCODONE HCL 5 MG PO TABS
5.0000 mg | ORAL_TABLET | ORAL | Status: DC | PRN
  Administered 2020-02-09 – 2020-02-10 (×4): 5 mg via ORAL
  Filled 2020-02-09 (×4): qty 1

## 2020-02-09 NOTE — Plan of Care (Signed)
  Problem: Education: Goal: Knowledge of General Education information will improve Description Including pain rating scale, medication(s)/side effects and non-pharmacologic comfort measures Outcome: Progressing   Problem: Health Behavior/Discharge Planning: Goal: Ability to manage health-related needs will improve Outcome: Progressing   

## 2020-02-09 NOTE — Progress Notes (Signed)
ANTICOAGULATION CONSULT NOTE - Initial Consult  Pharmacy Consult for heparin  Indication: pulmonary embolus  No Known Allergies  Patient Measurements: Height: 5\' 1"  (154.9 cm) Weight: 110 lb (49.9 kg) IBW/kg (Calculated) : 47.8 Heparin Dosing Weight: TBW  Vital Signs: Temp: 97.7 F (36.5 C) (02/21 0919) Temp Source: Oral (02/21 0919) BP: 101/73 (02/21 1045) Pulse Rate: 81 (02/21 1045)  Labs: Recent Labs    02/09/20 0955  HGB 12.3  HCT 38.3  PLT 318  CREATININE 0.71  TROPONINIHS 3    Estimated Creatinine Clearance: 60 mL/min (by C-G formula based on SCr of 0.71 mg/dL).   Medical History: Past Medical History:  Diagnosis Date  . Anemia   . Anxiety   . Chronic urticaria 11/23/2018  . Complication of anesthesia    Blood pressure drops low  . Environmental and seasonal allergies   . GERD (gastroesophageal reflux disease)   . Migraine   . OA (osteoarthritis)   . PONV (postoperative nausea and vomiting)   . RAYNAUDS SYNDROME 01/06/2011   Qualifier: Diagnosis of  By: Arnoldo Morale MD, Balinda Quails   . Uterine fibroid    diagnosed by OB/GYN Dr Mayra Neer  . Varicose veins of left lower extremity with both ulcer of ankle and inflammation ([place]) 07/07/2011   Referral to interventional radiology for vascular study and potential intervention   . Vertigo    Assessment: 14 YOF presenting with SOB on exertion, hx of COVID infection earlier in month, D-dimer <5 today, CT shows PE with BL heart strain. Not on anticoagulation PTA, CBC wnl.    Goal of Therapy:  Heparin level 0.3-0.7 units/ml Monitor platelets by anticoagulation protocol: Yes   Plan:  Heparin 3000 units IV x 1, and gtt at 850 units/hr F/u 6 hour heparin level  Bertis Ruddy, PharmD Clinical Pharmacist Please check AMION for all [place] numbers 02/09/2020 12:08 PM

## 2020-02-09 NOTE — H&P (Addendum)
Date: 02/09/2020               Patient Name:  Crystal Dillon MRN: [id number]  DOB: [birth year] Age / Sex: 54 y.o., female   PCP: Marijo File, MD         Medical Service: Internal Medicine Teaching Service         Attending Physician: Dr. Aldine Contes, MD    First Contact: Dr. Court Joy Pager: [phone]  Second Contact: Dr. Sherry Ruffing  Pager: [phone]       After Hours (After 5p/  First Contact Pager: [phone]  weekends / holidays): Second Contact Pager: [phone]   Chief Complaint: Chest pain  History of Present Illness: Crystal Dillon is a 53 year old person with a history of GERD, chronic urticaria on Xolair injections, Raynaud's, migraines and recent COVID 19 pneumonia diagnosed on Feb 1st who presents with chest pain and shortness of breath.  Patient says since Monday she has had worsening shortness of breath and right-sided back pain which was worse with lying down.  The right sided back pain has also progressed to the left side of her back. She feels it radiates from her back into her chest. She was given a Z-Pak by her PCP this week, but symptoms have not improved.  She has been taking tylenol to control the pain, but it has progressively worsened . She had an episode of feeling dizzy in setting of a hot shower and standing up too fast. She could not sleep due to the pain last night and came to the ED. She was found to have a elevated d-dimer and  submassive PE on CTPE. Patient admitted for observation in setting of borderline right heart strain read on CTPE.    Meds:  Current Facility-Administered Medications for the 02/09/20 encounter [facility] Encounter)  Medication  . omalizumab Arvid Right) prefilled syringe 300 mg   Current Meds  Medication Sig  . acetaminophen (TYLENOL) 500 MG tablet Take 1,000 mg by mouth every 6 (six) hours as needed for mild pain or headache.  . albuterol (VENTOLIN HFA) 108 (90 Base) MCG/ACT inhaler Inhale 2 puffs into the lungs every 6 (six) hours as  needed for wheezing.  Marland Kitchen azithromycin (ZITHROMAX) 250 MG tablet Take 250 mg by mouth See admin instructions. Take 2 tablets (500 mg totally) by mouth for 1 day; then take 1 tablet (250 mg totally) for 4 days  . clobetasol cream (TEMOVATE) 6.73 % Apply 1 application topically daily as needed (skin rash).   Marland Kitchen doxycycline (VIBRAMYCIN) 100 MG capsule Take 100 mg by mouth 2 (two) times daily.  Marland Kitchen eletriptan (RELPAX) 40 MG tablet TAKE 1 TABLET BY MOUTH AT ONSET OF HEADACHE MAY REPEAT IN 2 HOURS IF PERSISTS OR REOCCURS (Patient taking differently: Take 40 mg by mouth once. )  . EPINEPHrine (AUVI-Q) 0.3 mg/0.3 mL IJ SOAJ injection Use as directed for severe allergic reactions  . famotidine (PEPCID) 20 MG tablet TAKE 1 TABLET BY MOUTH TWICE A DAY (Patient taking differently: Take 20 mg by mouth daily. )  . fexofenadine (ALLEGRA) 180 MG tablet Take 180 mg by mouth daily as needed for allergies.   Marland Kitchen FLUoxetine (PROZAC) 20 MG capsule TAKE 1 CAPSULE BY MOUTH EVERY DAY (Patient taking differently: Take 20 mg by mouth daily. )  . fluticasone (FLONASE) 50 MCG/ACT nasal spray SPRAY 2 SPRAYS INTO EACH NOSTRIL EVERY DAY (Patient taking differently: Place 2 sprays into both nostrils daily as needed for allergies. )  . gabapentin (NEURONTIN) 100  MG capsule Take 100 mg by mouth daily.   Marland Kitchen HYDROcodone-homatropine (HYCODAN) 5-1.5 MG/5ML syrup Take 5 mLs by mouth every 8 (eight) hours as needed.  . meclizine (ANTIVERT) 12.5 MG tablet Take 0.5-1 tablets (6.25-12.5 mg total) by mouth 3 (three) times daily as needed for dizziness.  . Multiple Vitamins-Minerals (ONE-A-DAY WOMENS 50+ ADVANTAGE) TABS Take 1 tablet by mouth daily.   . ondansetron (ZOFRAN) 4 MG tablet TAKE 1 TABLET BY MOUTH EVERY 8 HOURS AS NEEDED FOR NAUSEA OR VOMITING (MIGRAINES). (Patient taking differently: Take 4 mg by mouth every 8 (eight) hours as needed for nausea or vomiting. )     Allergies: Allergies as of 02/09/2020  . (No Known Allergies)   Past  Medical History:  Diagnosis Date  . Anemia   . Anxiety   . Chronic urticaria 11/23/2018  . Complication of anesthesia    Blood pressure drops low  . Environmental and seasonal allergies   . GERD (gastroesophageal reflux disease)   . Migraine   . OA (osteoarthritis)   . PONV (postoperative nausea and vomiting)   . RAYNAUDS SYNDROME 01/06/2011   Qualifier: Diagnosis of  By: Arnoldo Morale MD, Balinda Quails   . Uterine fibroid    diagnosed by OB/GYN Dr Mayra Neer  . Varicose veins of left lower extremity with both ulcer of ankle and inflammation ([place]) 07/07/2011   Referral to interventional radiology for vascular study and potential intervention   . Vertigo     Family History:  Family History  Problem Relation Age of Onset  . COPD Mother   . Hyperlipidemia Mother   . Diabetes Mother   . Cancer Father        lung, smoker  . Diabetes Father   . Stroke Father   . Colon cancer Maternal Grandmother   . Diabetes Maternal Grandmother   . Healthy Sister   . Heart Problems Brother   . Crohn's disease Son   . Ulcerative colitis Daughter      Social History:  Social History   Tobacco Use  . Smoking status: Never Smoker  . Smokeless tobacco: Never Used  Substance Use Topics  . Alcohol use: Yes    Comment: rare  . Drug use: No     Review of Systems: A complete ROS was negative except as per HPI.   Physical Exam: Blood pressure (!) 134/96, pulse 85, temperature 97.7 F (36.5 C), temperature source Oral, resp. rate 20, height '5\' 1"'$  (1.549 m), weight 49.9 kg, last menstrual period 07/06/2011, SpO2 95 %.   General: Alert, nl appearance HE: Normocephalic, atraumatic , EOMI, Conjunctivae normal ENT: No congestion, no rhinorrhea moist, no exudate or erythema  Cardiovascular: Normal rate, regular rhythm.  No murmurs, rubs, or gallops Pulmonary : Tachypnea, Effort normal, not moving good air. No wheezes, rales, or rhonchi Abdominal: soft, nontender, no mass, no distention, bowel sounds  normal Musculoskeletal: no swelling , deformity, injury ,or tenderness in extremities, Skin: Warm, dry , multiple excoriations, erythematous rash on chest   Neurological: alert and oriented x4 , no weakness, no sensory deficit  Psychiatric/Behavioral:  normal mood, normal behavior   CXR: personally reviewed my interpretation is patchy opacities in mid and lower lung fields bilaterally. Consistent with recent COVID-19 infection  Assessment & Plan by Problem: Active Problems:   Pulmonary embolism (HCC)  Elita Dame is a 53 year old person with a history of GERD, chronic urticaria on Xolair injections, Raynaud's, migraines, and recent COVID 19 pneumonia diagnosed on Feb 1st who presented for evaluation  of chest pain and shortness of breath and found to have a submassive pulmonary embolism.   #Pulmonary embolism Likely provoked from recent COVID-19 pneumonia.  No family history of clotting disorders or recent travel.  Borderline right heart strain noted on CT so patient was admitted.  Seen by PCCM in ED who does not recommend thrombolytic therapy at this time with possible adverse outcomes such as GI bleeding and intracranial hemorrhage.   P: - Recommend age appropriate cancer screenings at discharge ( mammogram, pap smear, colonoscopy)  - TEE to evaluate heart strain -  Continue heparin, with plan to transition to oral anticoagulant  -  Telemetry  #Transaminitis Appears normal per lab review prior to COVID 19 infection, 01/27/2020 AST 79 , ALT 69 ,  alk phos 222.  Increased on admission AST 190 , ALT 195, Alk phos 262. This has been seen in covid-19 infections. Will continue to trend with labs and hold off on further workup at this time.  P: Trend CMP     Dispo: Admit patient to Inpatient with expected length of stay greater than 2 midnights.  Signed:  Tamsen Snider, MD PGY1

## 2020-02-09 NOTE — ED Provider Notes (Addendum)
[facility] EMERGENCY DEPARTMENT Provider Note   CSN: [id number] Arrival date & time: 02/09/20  [id number]     History Chief Complaint  Patient presents with  . Shortness of Breath    COVID    Crystal Dillon is a 54 y.o. female.  Presents to the emergency department with chief complaint shortness of breath.  Patient diagnosed with COVID-19 on February 1.  Treated initially with doxycycline and prednisone.  Seem to have some improvement but never fully returned to her baseline.  Over the last few days she is having some worsening shortness of breath and chest pain.  Given Z-Pak by PCP.  No improvement.  Pain initially was left-sided, now right-sided, seems to radiate to her right shoulder.  Sharp, relatively constant, difficulty taking deep breath.  Shortness of breath worsened with exertion.  Chest pain no change with exertion.  Fever yesterday to 100.6.  No leg pain or leg swelling.  Not on estrogen therapy, no history of prior DVT/PE.  Denies prior history of CAD.  HPI     Past Medical History:  Diagnosis Date  . Anemia   . Anxiety   . Chronic urticaria 11/23/2018  . Complication of anesthesia    Blood pressure drops low  . Environmental and seasonal allergies   . GERD (gastroesophageal reflux disease)   . Migraine   . OA (osteoarthritis)   . PONV (postoperative nausea and vomiting)   . RAYNAUDS SYNDROME 01/06/2011   Qualifier: Diagnosis of  By: Arnoldo Morale MD, Balinda Quails   . Uterine fibroid    diagnosed by OB/GYN Dr Mayra Neer  . Varicose veins of left lower extremity with both ulcer of ankle and inflammation ([place]) 07/07/2011   Referral to interventional radiology for vascular study and potential intervention   . Vertigo     Patient Active Problem List   Diagnosis Date Noted  . Primary osteoarthritis of both knees 12/14/2018  . Hx of migraines 11/23/2018  . Raynaud's syndrome without gangrene 11/23/2018  . Chronic urticaria 11/23/2018  . Family history of  inflammatory bowel disease 11/23/2018  . Upper respiratory infection 02/28/2017  . Fibroids 09/20/2016  . Anxiety state 09/20/2016  . DDD (degenerative disc disease), lumbar 09/20/2016  . Migraine 09/20/2016  . Left hip pain 09/20/2016  . Chronic pain of both knees 09/20/2016  . Hyperglycemia 09/20/2016    Past Surgical History:  Procedure Laterality Date  . ADENOIDECTOMY    . CESAREAN SECTION    . DENTAL SURGERY    . HYSTERECTOMY ABDOMINAL WITH SALPINGO-OOPHORECTOMY Bilateral 06/13/2019   Procedure: HYSTERECTOMY ABDOMINAL WITH SALPINGO-OOPHORECTOMY;  Surgeon: Marylynn Pearson, MD;  Location: [facility];  Service: Gynecology;  Laterality: Bilateral;  . HYSTEROSCOPY    . TONSILLECTOMY    . TUBAL LIGATION       OB History   No obstetric history on file.     Family History  Problem Relation Age of Onset  . COPD Mother   . Hyperlipidemia Mother   . Diabetes Mother   . Cancer Father        lung, smoker  . Diabetes Father   . Stroke Father   . Colon cancer Maternal Grandmother   . Diabetes Maternal Grandmother   . Healthy Sister   . Heart Problems Brother   . Crohn's disease Son   . Ulcerative colitis Daughter     Social History   Tobacco Use  . Smoking status: Never Smoker  . Smokeless tobacco: Never Used  Substance Use  Topics  . Alcohol use: Yes    Comment: rare  . Drug use: No    Home Medications Prior to Admission medications   Medication Sig Start Date End Date Taking? Authorizing Provider  clobetasol cream (TEMOVATE) 0.05 % Apply topically.    [provider]  Clobetasol Prop Emollient Base 0.05 % emollient cream Apply topically as needed.    [provider]  Clobetasol Propionate 0.025 % CREA  12/19/16   [provider]  Cobalamin Combinations (B-12) [phone] MCG SUBL  06/18/19   [provider]  eletriptan (RELPAX) 40 MG tablet TAKE 1 TABLET BY MOUTH AT ONSET OF HEADACHE MAY REPEAT IN 2 HOURS IF PERSISTS  OR REOCCURS 08/12/19   Lucretia Kern, DO  EPINEPHrine (AUVI-Q) 0.3 mg/0.3 mL IJ SOAJ injection Use as directed for severe allergic reactions 12/31/19   Kennith Gain, MD  famotidine (PEPCID) 20 MG tablet TAKE 1 TABLET BY MOUTH TWICE A DAY 10/29/19   Koberlein, Junell C, MD  fexofenadine (ALLEGRA) 180 MG tablet Take 180 mg by mouth daily.    [provider]  FLUoxetine (PROZAC) 20 MG capsule TAKE 1 CAPSULE BY MOUTH EVERY DAY 11/20/19   Koberlein, Junell C, MD  fluticasone (FLONASE) 50 MCG/ACT nasal spray SPRAY 2 SPRAYS INTO EACH NOSTRIL EVERY DAY 11/20/19   Koberlein, Andris Flurry C, MD  gabapentin (NEURONTIN) 100 MG capsule Take by mouth. 12/30/19 03/29/20  [provider]  hydrOXYzine (ATARAX/VISTARIL) 25 MG tablet Take 1 tablet (25 mg total) by mouth every 8 (eight) hours as needed. Patient not taking: Reported on 12/31/2019 10/31/19   Lucretia Kern, DO  meclizine (ANTIVERT) 12.5 MG tablet Take 0.5-1 tablets (6.25-12.5 mg total) by mouth 3 (three) times daily as needed for dizziness. 06/05/19   Koberlein, Steele Berg, MD  montelukast (SINGULAIR) 10 MG tablet TAKE 1 TABLET BY MOUTH EVERYDAY AT BEDTIME 10/29/19   Koberlein, Steele Berg, MD  Multiple Vitamins-Minerals (ONE-A-DAY WOMENS 50+ ADVANTAGE) TABS  06/18/19   [provider]  ondansetron (ZOFRAN) 4 MG tablet TAKE 1 TABLET BY MOUTH EVERY 8 HOURS AS NEEDED FOR NAUSEA OR VOMITING (MIGRAINES). 11/20/19   Caren Macadam, MD  triamcinolone cream (KENALOG) 0.1 % Apply 1 application topically as needed.    [provider]    Allergies    Patient has no known allergies.  Review of Systems   Review of Systems  Constitutional: Positive for fever. Negative for chills.  HENT: Negative for ear pain and sore throat.   Eyes: Negative for pain and visual disturbance.  Respiratory: Positive for shortness of breath. Negative for cough.   Cardiovascular: Positive for chest pain. Negative for palpitations.   Gastrointestinal: Negative for abdominal pain and vomiting.  Genitourinary: Negative for dysuria and hematuria.  Musculoskeletal: Negative for arthralgias and back pain.  Skin: Negative for color change and rash.  Neurological: Negative for seizures and syncope.  All other systems reviewed and are negative.   Physical Exam Updated Vital Signs BP 101/73   Pulse 81   Temp 97.7 F (36.5 C) (Oral)   Resp 17   Ht 5\' 1"  (1.549 m)   Wt 49.9 kg   LMP 07/06/2011   SpO2 95%   BMI 20.78 kg/m   Physical Exam Vitals and nursing note reviewed.  Constitutional:      General: She is not in acute distress.    Appearance: She is well-developed.  HENT:     Head: Normocephalic and atraumatic.  Eyes:  Conjunctiva/sclera: Conjunctivae normal.  Cardiovascular:     Rate and Rhythm: Normal rate and regular rhythm.     Heart sounds: No murmur.  Pulmonary:     Effort: Pulmonary effort is normal. No respiratory distress.     Breath sounds: Normal breath sounds.     Comments: Very mild tachypnea, no increased work of breathing Abdominal:     Palpations: Abdomen is soft.     Tenderness: There is no abdominal tenderness.  Musculoskeletal:     Cervical back: Neck supple.     Right lower leg: No edema.     Left lower leg: No edema.  Skin:    General: Skin is warm and dry.  Neurological:     General: No focal deficit present.     Mental Status: She is alert and oriented to person, place, and time.     ED Results / Procedures / Treatments   Labs (all labs ordered are listed, but only abnormal results are displayed) Labs Reviewed  COMPREHENSIVE METABOLIC PANEL - Abnormal; Notable for the following components:      Result Value   Glucose, Bld 117 (*)    Albumin 3.4 (*)    AST 190 (*)    ALT 195 (*)    Alkaline Phosphatase 262 (*)    All other components within normal limits  D-DIMER, QUANTITATIVE (NOT AT [facility]) - Abnormal; Notable for the following components:   D-Dimer, Quant 2.52  (*)    All other components within normal limits  CBC WITH DIFFERENTIAL/PLATELET  TROPONIN I (HIGH SENSITIVITY)  TROPONIN I (HIGH SENSITIVITY)    EKG None  Radiology DG Chest Portable 1 View  Result Date: 02/09/2020 CLINICAL DATA:  Dyspnea, COVID-19 a few weeks prior EXAM: PORTABLE CHEST 1 VIEW COMPARISON:  06/21/2014 chest radiograph. FINDINGS: Stable cardiomediastinal silhouette with normal heart size. No pneumothorax. No pleural effusion. Hazy patchy opacities throughout the mid to lower lungs, predominantly peripheral, new. IMPRESSION: New hazy patchy opacities throughout the mid to lower lungs, predominantly peripheral, compatible with COVID-19 pneumonia. Electronically Signed   By: Ilona Sorrel M.D.   On: 02/09/2020 10:07    Procedures .Critical Care Performed by: Lucrezia Starch, MD Authorized by: Lucrezia Starch, MD   Critical care provider statement:    Critical care time (minutes):  48   Critical care was necessary to treat or prevent imminent or life-threatening deterioration of the following conditions:  Respiratory failure   Critical care was time spent personally by me on the following activities:  Discussions with consultants, evaluation of patient's response to treatment, examination of patient, ordering and performing treatments and interventions, ordering and review of laboratory studies, ordering and review of radiographic studies, pulse oximetry, re-evaluation of patient's condition, obtaining history from patient or surrogate and review of old charts   (including critical care time)  Medications Ordered in ED Medications  iohexol (OMNIPAQUE) 350 MG/ML injection 100 mL (45 mLs Intravenous Contrast Given 02/09/20 1148)    ED Course  I have reviewed the triage vital signs and the nursing notes.  Pertinent labs & imaging results that were available during my care of the patient were reviewed by me and considered in my medical decision making (see chart for  details).  Clinical Course as of Feb 08 1337  Sun Feb 09, 2020  1025 Will check CTA chest to r/o PE  D-Dimer, Quant(!): 2.52 [RD]  1200 Large PE per my read,   [RD]  1215 Discussed with pulm, likely just [facility]  and admit hosp, they will drop consult note   [RD]    Clinical Course User Index [RD] Lucrezia Starch, MD   MDM Rules/Calculators/A&P                      54 year old lady with notable history recent COVID-19.  Now with worsening chest pain and shortness of breath.  Vital signs stable, given history and recent Covid, concern for PE.  Dimer elevated, CTA chest ordered.  Demonstrated acute hilar and central lobar unilateral pulmonary emboli with borderline heart strain.  Contacted pulmonology, they will evaluate patient, likely recommendation will be anticoagulation and medicine admission.  Consulted pharmacy for heparin.  Updated patient and husband via phone.  Discussed with internal medicine residents, they will admit.  Final Clinical Impression(s) / ED Diagnoses Final diagnoses:  Acute pulmonary embolism, unspecified pulmonary embolism type, unspecified whether acute cor pulmonale present [facility])    Rx / DC Orders ED Discharge Orders    None       Lucrezia Starch, MD 02/09/20 1346    Lucrezia Starch, MD 02/09/20 1346

## 2020-02-09 NOTE — Consult Note (Signed)
NAME:  [name], MRN:  [id number], DOB:  Oct 07, 1966, LOS: 0 ADMISSION DATE:  02/09/2020, CONSULTATION DATE:  02/09/2020  REFERRING MD:  Lucrezia Starch, MD, CHIEF COMPLAINT:  "pulmonary embolism"  History of present illness   Crystal Dillon is a 54 y.o. woman with a history of chronic urticaria on xolair injections, allergic and seasonal rhinitis and GERD who presents with acute onset chest pain. She was diagnosed with COVID 19 pneumonia on Jan 28th and was not hospitalized, but observed in the ED for 12 hours and given IV fluids. She notes earlier this week having worsening right-sided back pain which is worse when she was lying down and subsequently progressed to her left shoulder as well.  She has been taking Tylenol at home for the pain.  She is also been having worsening shortness of breath.  She denies any chest pain, has not had any syncope.  She has been feeling dizzy, this is in the context of taking a hot shower and standing up too fast.  She presented with worsening back pain to the ED this morning and was found to have acute pulmonary embolism.  PCCM is being consulted to assist with valuation of acute pulmonary embolism in the setting of COVID-19.  Note no family history of hypercoagulable disorder.    She is a never smoker but does have passive smoke exposure in her parents and husband. At baseline she is fairly active and independent with ADLs without dyspnea.  Past Medical History  She,  has a past medical history of Anemia, Anxiety, Chronic urticaria (123456), Complication of anesthesia, Environmental and seasonal allergies, GERD (gastroesophageal reflux disease), Migraine, OA (osteoarthritis), PONV (postoperative nausea and vomiting), RAYNAUDS SYNDROME (01/06/2011), Uterine fibroid, Varicose veins of left lower extremity with both ulcer of ankle and inflammation ([place]) (07/07/2011), and Vertigo.   Consults:  PCCM  Procedures:   Significant Diagnostic Tests:  CT Angio  done 2/21 demonstrates acute pulmonary embolism of the right main stem PA with acute right heart strain.   Micro Data:  POCT COVID 19 positive on 1/28.   Antimicrobials:    Objective   Blood pressure 138/87, pulse 81, temperature 97.7 F (36.5 C), temperature source Oral, resp. rate 20, height 5\' 1"  (1.549 m), weight 49.9 kg, last menstrual period 07/06/2011, SpO2 97 %.       No intake or output data in the 24 hours ending 02/09/20 1332 Filed Weights   02/09/20 0921  Weight: 49.9 kg    Examination: General: resting comfortably, no distress HENT: mmm Lungs: clear to auscultation bilaterally, she is tachypneic, able to complete full sentences without wheezes or crackles Cardiovascular: RRR, HR in the low 90s. No mrg. No extra heart sounds Abdomen: scaphoid, soft, nontender, nondistended Extremities: no lower extremity edema Neuro: normal speech, moves all 4 extremities MSK: multiple excoriations, no hives Lines/Tubes: PIV  Assessment & Plan:  Acute pulmonary embolism - provoked COVID 19 pneumonia  [name] has presents with acute pulmonary embolism in the setting of hypercoagulability due to recent COVID 19 pneumonia. Despite having some suggestion of right heart strain on her CT angiography, She does not have evidence of myocardial necrosis based on cardiac biomarkers and is hemodynamically stable without clinically impending obstruction shock physiology. In this situation anticoagulation with heparin and transition to an oral agent for 3-6 months is appropriate.  I would not recommend thrombolytic therapy.  I reviewed the risks and benefits with the patient regarding thrombolytic therapy and adverse outcomes such as clinical significant  GI bleeding and intracranial hemorrhage.   She should have an updated age-appropriate cancer screening as well for discharge recommendations (mammogram, pap smear, colonoscopy.) Would order echocardiogram for completeness. Supportive care for the  COVID 19 pneumonia. [place] for admission to floor with telemetry.    Antithrombotic Therapy for VTE Disease: CHEST Guideline and Expert Panel Report. Chest. 2016 Feb;149(2):315-352. doi: 10.1016/[email or web address].2015.11.026. Epub 2016 Jan 7.   Labs   CBC: Recent Labs  Lab 02/09/20 0955  WBC 6.4  NEUTROABS 4.7  HGB 12.3  HCT 38.3  MCV 90.3  PLT 0000000    Basic Metabolic Panel: Recent Labs  Lab 02/09/20 0955  NA 139  K 3.8  CL 100  CO2 25  GLUCOSE 117*  BUN 7  CREATININE 0.71  CALCIUM 9.9   GFR: Estimated Creatinine Clearance: 60 mL/min (by C-G formula based on SCr of 0.71 mg/dL). Recent Labs  Lab 02/09/20 0955  WBC 6.4    Liver Function Tests: Recent Labs  Lab 02/09/20 0955  AST 190*  ALT 195*  ALKPHOS 262*  BILITOT 0.9  PROT 7.7  ALBUMIN 3.4*   No results for input(s): LIPASE, AMYLASE in the last 168 hours. No results for input(s): AMMONIA in the last 168 hours.  ABG No results found for: PHART, PCO2ART, PO2ART, HCO3, TCO2, ACIDBASEDEF, O2SAT   Coagulation Profile: No results for input(s): INR, PROTIME in the last 168 hours.  Cardiac Enzymes: No results for input(s): CKTOTAL, CKMB, CKMBINDEX, TROPONINI in the last 168 hours.  HbA1C: Hgb A1c MFr Bld  Date/Time Value Ref Range Status  10/02/2018 05:10 PM 5.8 4.6 - 6.5 % Final    Comment:    Glycemic Control Guidelines for People with Diabetes:Non Diabetic:  <6%Goal of Therapy: <7%Additional Action Suggested:  >8%   12/15/2016 09:09 AM 5.4 4.6 - 6.5 % Final    Comment:    Glycemic Control Guidelines for People with Diabetes:Non Diabetic:  <6%Goal of Therapy: <7%Additional Action Suggested:  >8%     CBG: No results for input(s): GLUCAP in the last 168 hours.  Review of Systems:   Review of Systems  Constitutional: Negative for chills, fever and weight loss.  HENT: Negative for congestion, sinus pain and sore throat.   Eyes: Negative for discharge and redness.  Respiratory: Positive for shortness of  breath. Negative for cough, hemoptysis, sputum production and wheezing.   Cardiovascular: Positive for chest pain. Negative for palpitations and leg swelling.  Gastrointestinal: Positive for heartburn. Negative for nausea and vomiting.  Musculoskeletal: Negative for joint pain and myalgias.  Skin: Positive for rash.  Neurological: Negative for dizziness, tremors, focal weakness and headaches.  Endo/Heme/Allergies: Positive for environmental allergies.  Psychiatric/Behavioral: Negative for depression. The patient is not nervous/anxious.   All other systems reviewed and are negative.  Surgical History    Past Surgical History:  Procedure Laterality Date  . ADENOIDECTOMY    . CESAREAN SECTION    . DENTAL SURGERY    . HYSTERECTOMY ABDOMINAL WITH SALPINGO-OOPHORECTOMY Bilateral 06/13/2019   Procedure: HYSTERECTOMY ABDOMINAL WITH SALPINGO-OOPHORECTOMY;  Surgeon: Marylynn Pearson, MD;  Location: [facility];  Service: Gynecology;  Laterality: Bilateral;  . HYSTEROSCOPY    . TONSILLECTOMY    . TUBAL LIGATION       Social History   reports that she has never smoked. She has never used smokeless tobacco. She reports current alcohol use. She reports that she does not use drugs.   Family History   Her family history includes COPD in her mother;  Cancer in her father; Colon cancer in her maternal grandmother; Crohn's disease in her son; Diabetes in her father, maternal grandmother, and mother; Healthy in her sister; Heart Problems in her brother; Hyperlipidemia in her mother; Stroke in her father; Ulcerative colitis in her daughter.   Allergies No Known Allergies   Home Medications  Prior to Admission medications   Medication Sig Start Date End Date Taking? Authorizing Provider  clobetasol cream (TEMOVATE) 0.05 % Apply topically.    [provider]  Clobetasol Prop Emollient Base 0.05 % emollient cream Apply topically as needed.    [provider]  Clobetasol  Propionate 0.025 % CREA  12/19/16   [provider]  Cobalamin Combinations (B-12) [phone] MCG SUBL  06/18/19   [provider]  eletriptan (RELPAX) 40 MG tablet TAKE 1 TABLET BY MOUTH AT ONSET OF HEADACHE MAY REPEAT IN 2 HOURS IF PERSISTS OR REOCCURS 08/12/19   Lucretia Kern, DO  EPINEPHrine (AUVI-Q) 0.3 mg/0.3 mL IJ SOAJ injection Use as directed for severe allergic reactions 12/31/19   Kennith Gain, MD  famotidine (PEPCID) 20 MG tablet TAKE 1 TABLET BY MOUTH TWICE A DAY 10/29/19   Koberlein, Junell C, MD  fexofenadine (ALLEGRA) 180 MG tablet Take 180 mg by mouth daily.    [provider]  FLUoxetine (PROZAC) 20 MG capsule TAKE 1 CAPSULE BY MOUTH EVERY DAY 11/20/19   Koberlein, Junell C, MD  fluticasone (FLONASE) 50 MCG/ACT nasal spray SPRAY 2 SPRAYS INTO EACH NOSTRIL EVERY DAY 11/20/19   Koberlein, Andris Flurry C, MD  gabapentin (NEURONTIN) 100 MG capsule Take by mouth. 12/30/19 03/29/20  [provider]  hydrOXYzine (ATARAX/VISTARIL) 25 MG tablet Take 1 tablet (25 mg total) by mouth every 8 (eight) hours as needed. Patient not taking: Reported on 12/31/2019 10/31/19   Lucretia Kern, DO  meclizine (ANTIVERT) 12.5 MG tablet Take 0.5-1 tablets (6.25-12.5 mg total) by mouth 3 (three) times daily as needed for dizziness. 06/05/19   Koberlein, Steele Berg, MD  montelukast (SINGULAIR) 10 MG tablet TAKE 1 TABLET BY MOUTH EVERYDAY AT BEDTIME 10/29/19   Koberlein, Steele Berg, MD  Multiple Vitamins-Minerals (ONE-A-DAY WOMENS 50+ ADVANTAGE) TABS  06/18/19   [provider]  ondansetron (ZOFRAN) 4 MG tablet TAKE 1 TABLET BY MOUTH EVERY 8 HOURS AS NEEDED FOR NAUSEA OR VOMITING (MIGRAINES). 11/20/19   Caren Macadam, MD  triamcinolone cream (KENALOG) 0.1 % Apply 1 application topically as needed.    [provider]

## 2020-02-09 NOTE — ED Triage Notes (Signed)
Pt to ED via [facility] with exertion. Reports tested positive COVID Feb 1, treated with doxy and prednisone for secondary pneumonia. Little relief, now currently on z pack.

## 2020-02-09 NOTE — ED Notes (Signed)
Attempted to call nursing report.  

## 2020-02-09 NOTE — Progress Notes (Signed)
Crystal Dillon for heparin  Indication: pulmonary embolus  No Known Allergies  Patient Measurements: Height: 5\' 1"  (154.9 cm) Weight: 113 lb 8.6 oz (51.5 kg) IBW/kg (Calculated) : 47.8 Heparin Dosing Weight: TBW  Vital Signs: Temp: 99.4 F (37.4 C) (02/21 1730) Temp Source: Oral (02/21 1730) BP: 138/93 (02/21 1730) Pulse Rate: 109 (02/21 1859)  Labs: Recent Labs    02/09/20 0955 02/09/20 1200 02/09/20 1846  HGB 12.3  --   --   HCT 38.3  --   --   PLT 318  --   --   HEPARINUNFRC  --   --  0.35  CREATININE 0.71  --   --   TROPONINIHS 3 3  --     Estimated Creatinine Clearance: 60 mL/min (by C-G formula based on SCr of 0.71 mg/dL).   Medical History: Past Medical History:  Diagnosis Date  . Anemia   . Anxiety   . Chronic urticaria 11/23/2018  . Complication of anesthesia    Blood pressure drops low  . Environmental and seasonal allergies   . GERD (gastroesophageal reflux disease)   . Migraine   . OA (osteoarthritis)   . PONV (postoperative nausea and vomiting)   . RAYNAUDS SYNDROME 01/06/2011   Qualifier: Diagnosis of  By: Arnoldo Morale MD, Balinda Quails   . Uterine fibroid    diagnosed by OB/GYN Dr Mayra Neer  . Varicose veins of left lower extremity with both ulcer of ankle and inflammation ([place]) 07/07/2011   Referral to interventional radiology for vascular study and potential intervention   . Vertigo    Assessment: [name] presenting with SOB on exertion, hx of COVID infection earlier in month, D-dimer <5 today, CT shows PE with BL heart strain. Not on anticoagulation PTA, CBC wnl.    Initial heparin level 0.35  Goal of Therapy:  Heparin level 0.3-0.7 units/ml Monitor platelets by anticoagulation protocol: Yes   Plan:  Increase heparin to 950 units / hr to prevent drop Follow up AM heparin level  Thank you Crystal Dillon, PharmD Please check AMION for all [place] numbers 02/09/2020 7:47 PM

## 2020-02-09 NOTE — Progress Notes (Signed)
Crystal Dillon is a 54 y.o. female patient admitted from ED awake, alert - oriented  X 4 - no acute distress noted.  VSS - Blood pressure (!) 138/93, pulse (!) 109, temperature 99.4 F (37.4 C), temperature source Oral, resp. rate (!) 30, height 5\' 1"  (1.549 m), weight 49.9 kg, last menstrual period 07/06/2011, SpO2 91 %.    IV in place, occlusive dsg intact without redness.  Orientation to room, and floor completed with information packet given to patient/family.  Patient declined safety video at this time.  Admission INP armband ID verified with patient/family, and in place.   SR up x 2, fall assessment complete, with patient and family able to verbalize understanding of risk associated with falls, and verbalized understanding to call nsg before up out of bed.  Call light within reach, patient able to voice, and demonstrate understanding.  Skin, clean-dry- intact without evidence of bruising, or skin tears.   No evidence of skin break down noted on exam.     Will cont to eval and treat per MD orders.  Luci Bank, RN 02/09/2020 6:18 PM

## 2020-02-10 ENCOUNTER — Other Ambulatory Visit (HOSPITAL_COMMUNITY): Payer: BC Managed Care – PPO

## 2020-02-10 ENCOUNTER — Inpatient Hospital Stay (HOSPITAL_COMMUNITY): Payer: BC Managed Care – PPO

## 2020-02-10 DIAGNOSIS — Z1231 Encounter for screening mammogram for malignant neoplasm of breast: Secondary | ICD-10-CM | POA: Diagnosis not present

## 2020-02-10 DIAGNOSIS — Z13820 Encounter for screening for osteoporosis: Secondary | ICD-10-CM | POA: Diagnosis not present

## 2020-02-10 DIAGNOSIS — Z8616 Personal history of COVID-19: Secondary | ICD-10-CM | POA: Diagnosis not present

## 2020-02-10 DIAGNOSIS — I2609 Other pulmonary embolism with acute cor pulmonale: Secondary | ICD-10-CM | POA: Diagnosis not present

## 2020-02-10 DIAGNOSIS — I2699 Other pulmonary embolism without acute cor pulmonale: Secondary | ICD-10-CM | POA: Diagnosis not present

## 2020-02-10 DIAGNOSIS — M810 Age-related osteoporosis without current pathological fracture: Secondary | ICD-10-CM | POA: Diagnosis not present

## 2020-02-10 DIAGNOSIS — R945 Abnormal results of liver function studies: Secondary | ICD-10-CM | POA: Diagnosis not present

## 2020-02-10 DIAGNOSIS — R0602 Shortness of breath: Secondary | ICD-10-CM | POA: Diagnosis not present

## 2020-02-10 DIAGNOSIS — J302 Other seasonal allergic rhinitis: Secondary | ICD-10-CM | POA: Diagnosis not present

## 2020-02-10 DIAGNOSIS — D72819 Decreased white blood cell count, unspecified: Secondary | ICD-10-CM | POA: Diagnosis not present

## 2020-02-10 DIAGNOSIS — R9389 Abnormal findings on diagnostic imaging of other specified body structures: Secondary | ICD-10-CM | POA: Diagnosis not present

## 2020-02-10 DIAGNOSIS — U071 COVID-19: Secondary | ICD-10-CM | POA: Diagnosis not present

## 2020-02-10 DIAGNOSIS — Z01419 Encounter for gynecological examination (general) (routine) without abnormal findings: Secondary | ICD-10-CM | POA: Diagnosis not present

## 2020-02-10 DIAGNOSIS — Z6821 Body mass index (BMI) 21.0-21.9, adult: Secondary | ICD-10-CM | POA: Diagnosis not present

## 2020-02-10 LAB — COMPREHENSIVE METABOLIC PANEL
ALT: 144 U/L — ABNORMAL HIGH (ref 0–44)
AST: 90 U/L — ABNORMAL HIGH (ref 15–41)
Albumin: 3 g/dL — ABNORMAL LOW (ref 3.5–5.0)
Alkaline Phosphatase: 222 U/L — ABNORMAL HIGH (ref 38–126)
Anion gap: 11 (ref 5–15)
BUN: 7 mg/dL (ref 6–20)
CO2: 25 mmol/L (ref 22–32)
Calcium: 9 mg/dL (ref 8.9–10.3)
Chloride: 97 mmol/L — ABNORMAL LOW (ref 98–111)
Creatinine, Ser: 0.74 mg/dL (ref 0.44–1.00)
GFR calc Af Amer: >60 mL/min (ref >60)
GFR calc non Af Amer: >60 mL/min (ref >60)
Glucose, Bld: 104 mg/dL — ABNORMAL HIGH (ref 70–99)
Potassium: 4.1 mmol/L (ref 3.5–5.1)
Sodium: 133 mmol/L — ABNORMAL LOW (ref 135–145)
Total Bilirubin: 0.7 mg/dL (ref 0.3–1.2)
Total Protein: 6.8 g/dL (ref 6.5–8.1)

## 2020-02-10 LAB — CBC
HCT: 33.4 % — ABNORMAL LOW (ref 36.0–46.0)
Hemoglobin: 11 g/dL — ABNORMAL LOW (ref 12.0–15.0)
MCH: 29.1 pg (ref 26.0–34.0)
MCHC: 32.9 g/dL (ref 30.0–36.0)
MCV: 88.4 fL (ref 80.0–100.0)
Platelets: 285 10*3/uL (ref 150–400)
RBC: 3.78 MIL/uL — ABNORMAL LOW (ref 3.87–5.11)
RDW: 13.8 % (ref 11.5–15.5)
WBC: 7.3 10*3/uL (ref 4.0–10.5)
nRBC: 0 % (ref 0.0–0.2)

## 2020-02-10 LAB — ECHOCARDIOGRAM LIMITED
Height: 61 in
Weight: 1816.59 oz

## 2020-02-10 LAB — GLUCOSE, CAPILLARY: Glucose-Capillary: 102 mg/dL — ABNORMAL HIGH (ref 70–99)

## 2020-02-10 LAB — HEPARIN LEVEL (UNFRACTIONATED): Heparin Unfractionated: 0.39 IU/mL (ref 0.30–0.70)

## 2020-02-10 MED ORDER — LORATADINE 10 MG PO TABS
10.0000 mg | ORAL_TABLET | Freq: Every day | ORAL | Status: DC
  Administered 2020-02-10: 10 mg via ORAL
  Filled 2020-02-10: qty 1

## 2020-02-10 MED ORDER — APIXABAN 5 MG PO TABS: ORAL_TABLET | ORAL | 0 refills | Status: DC

## 2020-02-10 MED ORDER — ONDANSETRON HCL 4 MG/2ML IJ SOLN
4.0000 mg | Freq: Four times a day (QID) | INTRAMUSCULAR | Status: DC | PRN
  Administered 2020-02-10: 14:00:00 4 mg via INTRAVENOUS
  Filled 2020-02-10: qty 2

## 2020-02-10 MED ORDER — ALBUTEROL SULFATE HFA 108 (90 BASE) MCG/ACT IN AERS: 2.0000 | INHALATION_SPRAY | Freq: Four times a day (QID) | RESPIRATORY_TRACT | Status: DC | PRN

## 2020-02-10 MED ORDER — FAMOTIDINE 20 MG PO TABS
20.0000 mg | ORAL_TABLET | Freq: Two times a day (BID) | ORAL | Status: DC
  Administered 2020-02-10: 10:00:00 20 mg via ORAL
  Filled 2020-02-10: qty 1

## 2020-02-10 MED ORDER — LACTATED RINGERS IV SOLN: INTRAVENOUS | Status: AC

## 2020-02-10 MED ORDER — GABAPENTIN 100 MG PO CAPS
100.0000 mg | ORAL_CAPSULE | Freq: Every day | ORAL | Status: DC
  Administered 2020-02-10: 100 mg via ORAL
  Filled 2020-02-10: qty 1

## 2020-02-10 MED ORDER — ELETRIPTAN HYDROBROMIDE 20 MG PO TABS
40.0000 mg | ORAL_TABLET | ORAL | Status: DC | PRN
  Administered 2020-02-10: 40 mg via ORAL
  Filled 2020-02-10 (×2): qty 2

## 2020-02-10 MED ORDER — APIXABAN 5 MG PO TABS
10.0000 mg | ORAL_TABLET | Freq: Two times a day (BID) | ORAL | Status: DC
  Administered 2020-02-10: 10 mg via ORAL
  Filled 2020-02-10: qty 2

## 2020-02-10 MED ORDER — FLUTICASONE PROPIONATE 50 MCG/ACT NA SUSP: 2.0000 | Freq: Every day | NASAL | Status: DC | PRN

## 2020-02-10 MED ORDER — FLUOXETINE HCL 20 MG PO CAPS
20.0000 mg | ORAL_CAPSULE | Freq: Every day | ORAL | Status: DC
  Administered 2020-02-10: 10:00:00 20 mg via ORAL
  Filled 2020-02-10: qty 1

## 2020-02-10 MED ORDER — APIXABAN 5 MG PO TABS: 5.0000 mg | ORAL_TABLET | Freq: Two times a day (BID) | ORAL | Status: DC

## 2020-02-10 MED FILL — ELIQUIS STARTER PACK 5 MG T: 5 | 30 days supply | Qty: 74 | Fill #0

## 2020-02-10 NOTE — Progress Notes (Signed)
Transitions of Care Pharmacist Note  Aleah Grajewski is a 54 y.o. female that has been diagnosed with PE and will be prescribed Eliquis (apixaban) at discharge.   Patient Education: I provided the following education on 2/22 to the patient: How to take the medication Described what the medication is Signs of bleeding Signs/symptoms of VTE and stroke  Answered their questions  Discharge Medications Plan: The patient wants to have their discharge medications filled by the Transitions of Care pharmacy rather than their usual pharmacy.  The discharge orders pharmacy has been changed to the Transitions of Care pharmacy, the patient will receive a phone call regarding co-pay, and their medications will be delivered by the Transitions of Care pharmacy.    Thank you,   Berenice Bouton, PharmD PGY1 Pharmacy Resident  Please check AMION for all [facility] phone numbers After 10:00 PM, call [place] [phone]  February 10, 2020

## 2020-02-10 NOTE — Care Management (Signed)
CM acknowledges medication consult regarding covered NOAC's.  CM submitted benefit checks for Eliquis, Xarelto and Brilinta.  Pt will also be able to use copay card for meds as pt does not have medicaid nor medicare per [employer]

## 2020-02-10 NOTE — Progress Notes (Signed)
Initial Nutrition Assessment   RD working remotely.  DOCUMENTATION CODES:   Not applicable  INTERVENTION:  Continue Ensure Enlive po BID, each supplement provides 350 kcal and 20 grams of protein  Encourage adequate PO intake.   NUTRITION DIAGNOSIS:   Increased nutrient needs related to acute illness(COVID) as evidenced by estimated needs.  GOAL:   Patient will meet greater than or equal to 90% of their needs  MONITOR:   PO intake, Supplement acceptance, Skin, Weight trends, Labs, I & O's  REASON FOR ASSESSMENT:   Malnutrition Screening Tool    ASSESSMENT:   54 year old person with a history of GERD, chronic urticaria on Xolair injections, Raynaud's, migraines, and recent COVID 19 pneumonia diagnosed on Feb 1st who presented for evaluation of chest pain and shortness of breath and found to have a submassive pulmonary embolism.  Pt unavailable during attempted time of contact. RD unable to obtain pt nutrition history at this time. Meal completion has been 60-100%. Pt currently has Ensure ordered and has been consuming them. RD to continue with current orders to aid in caloric and protein needs.    Unable to complete Nutrition-Focused physical exam at this time.   Labs and medications reviewed.   Diet Order:   Diet Order            Diet Heart Room service appropriate? Yes; Fluid consistency: Thin  Diet effective now              EDUCATION NEEDS:   Not appropriate for education at this time  Skin:  Skin Assessment: Reviewed RN Assessment  Last BM:  2/22  Height:   Ht Readings from Last 1 Encounters:  02/09/20 5\' 1"  (1.549 m)    Weight:   Wt Readings from Last 1 Encounters:  02/09/20 51.5 kg    BMI:  Body mass index is 21.45 kg/m.  Estimated Nutritional Needs:   Kcal:  1600-1800  Protein:  70-90 grams  Fluid:  >/= 1.6 L/day   Corrin Parker, MS, RD, LDN RD pager number/after hours weekend pager number on Amion.

## 2020-02-10 NOTE — Discharge Instructions (Signed)
Information on my medicine - ELIQUIS (apixaban)  This medication education was reviewed with me or my healthcare representative as part of my discharge preparation.  The pharmacist that spoke with me during my hospital stay was:  Nica Friske Z  Lovell Roe,, RPH  Why was Eliquis prescribed for you? Eliquis was prescribed to treat blood clots that may have been found in the veins of your legs (deep vein thrombosis) or in your lungs (pulmonary embolism) and to reduce the risk of them occurring again.  What do You need to know about Eliquis ? The starting dose is 10 mg (two 5 mg tablets) taken TWICE daily for the FIRST SEVEN (7) DAYS, then on (enter date)  02/17/2020  the dose is reduced to ONE 5 mg tablet taken TWICE daily.  Eliquis may be taken with or without food.   Try to take the dose about the same time in the morning and in the evening. If you have difficulty swallowing the tablet whole please discuss with your pharmacist how to take the medication safely.  Take Eliquis exactly as prescribed and DO NOT stop taking Eliquis without talking to the doctor who prescribed the medication.  Stopping may increase your risk of developing a new blood clot.  Refill your prescription before you run out.  After discharge, you should have regular check-up appointments with your healthcare provider that is prescribing your Eliquis.    What do you do if you miss a dose? If a dose of ELIQUIS is not taken at the scheduled time, take it as soon as possible on the same day and twice-daily administration should be resumed. The dose should not be doubled to make up for a missed dose.  Important Safety Information A possible side effect of Eliquis is bleeding. You should call your healthcare provider right away if you experience any of the following: ? Bleeding from an injury or your nose that does not stop. ? Unusual colored urine (red or dark brown) or unusual colored stools (red or black). ? Unusual bruising  for unknown reasons. ? A serious fall or if you hit your head (even if there is no bleeding).  Some medicines may interact with Eliquis and might increase your risk of bleeding or clotting while on Eliquis. To help avoid this, consult your healthcare provider or pharmacist prior to using any new prescription or non-prescription medications, including herbals, vitamins, non-steroidal anti-inflammatory drugs (NSAIDs) and supplements.  This website has more information on Eliquis (apixaban): http://www.eliquis.com/eliquis/home   

## 2020-02-10 NOTE — Progress Notes (Signed)
Starting Apixaban for PE, spoke to RN who will stop Heparin when she gives Apixaban.

## 2020-02-10 NOTE — TOC Benefit Eligibility Note (Signed)
Transition of Care [facility]) Benefit Eligibility Note    Patient Details  Name: Celina Atherley MRN: [id number] Date of Birth: 08-31-1966   Medication/Dose: Ty Hilts, Eliquis  Covered?: Yes  Spoke with Person/Company/Phone Number:: [employer]  Co-Pay: $30 for 30 day supply per rx  Prior Approval: No  Additional Notes: All 3 medications are covered, No prior auth needed. Each have a $30 o-pay at [place] Phone Number: 02/10/2020, 1:58 PM

## 2020-02-10 NOTE — Progress Notes (Signed)
   Subjective:  Patient reports her chest pain is controlled , but she is having a severe migraine this morning and she has nausea from the migraine. We will start her home migraine medication and put a medication in for nausea. Discussed plan for TTE today . We also have consulted TOC for help finding the most affordable oral anticoagulant.   Objective:  Vital signs in last 24 hours: Vitals:   02/09/20 2000 02/10/20 0000 02/10/20 0354 02/10/20 0402  BP: 115/87 97/70 (!) 143/99   Pulse: (!) 123 91 (!) 117 96  Resp: 19 13 (!) 28 (!) 21  Temp: 98.9 F (37.2 C)  97.6 F (36.4 C)   TempSrc: Axillary  Oral   SpO2: 92% 91% 91% 92%  Weight:      Height:        General: Alert, appears to be in pain  Cardiovascular: Tachycardic, regular rhythm.  No murmurs, rubs, or gallops Pulmonary : shallow breaths, diminished breath sounds. No wheezes, rales, or rhonchi Abdominal: soft, nontender, bowel sounds normal Musculoskeletal: no swelling , deformity, injury ,or tenderness in extremities,  Assessment/Plan:  Active Problems:   Pulmonary embolism (HCC)  Crystal Dillon is a 54 year old person with a history of GERD, chronic urticaria on Xolair injections, Raynaud's, migraines, and recent COVID-19 pneumonia diagnosed on February 1 who presented for evaluation of chest pain shortness of breath.    Admitted on 2/21. Patient was found to have submassive pulmonary embolism and started on heparin.  Evaluated by PCCM in ED who did not recommend thrombolytic therapy at this time with possible adverse outcomes such as GI bleeding intracranial hemorrhage.  #Pulmonary embolism Patient is tachycardic, normotensive, Sating well on 2L.  TEE scheduled to better evaluate heart strain. TOC has been consulted for help with finding affordable DOAC. Plan to discharge home, pending starting DOAC and TEE. Plan:  -  F/U medication covered by insurance, transition from Heparin to DOAC - Wean supplemental oxygen - F/u  TTE  #Transaminitis As mentioned in H&P I believe this is related to COVID-19 infection, transaminitis is improving today.  Will recommend follow-up monitoring by PCP  #Migraine Patient has history of migraine and has eletriptan at home which helps. P: - Start home Eletriptan  - Zofran for nausea    Prior to Admission Living Arrangement: home Anticipated Discharge Location: home Barriers to Discharge: TEE and finding most affordable DOAC Dispo: Anticipated discharge in approximately 0-1 day(s).   Tamsen Snider, MD PGY1

## 2020-02-10 NOTE — Progress Notes (Signed)
ANTICOAGULATION CONSULT NOTE - Follow Up Consult  Pharmacy Consult for heparin Indication: pulmonary embolus  Labs: Recent Labs    02/09/20 0955 02/09/20 1200 02/09/20 1846 02/10/20 0242  HGB 12.3  --   --  11.0*  HCT 38.3  --   --  33.4*  PLT 318  --   --  285  HEPARINUNFRC  --   --  0.35 0.39  CREATININE 0.71  --   --  0.74  TROPONINIHS 3 3  --   --     Assessment/Plan:  54yo female remains therapeutic on heparin. Will continue gtt at current rate and monitor daily level.   Wynona Neat, PharmD, BCPS  02/10/2020,3:46 AM

## 2020-02-10 NOTE — Care Management (Addendum)
Pt deemed appropriate for discharge home today.  Pt will discharge home on Elquis - TOC pharmacy will fill first script.  CM communicated copay with pt and also benefit of reduced copay card as pt confirms she does not have Medicare nor Medicaid.  CM printed reduced copay card to unit printer and bedside nurse to give to pt prior to discharge.  No other CM needs - CM signing off  Update 1440:  Pt will need home oxygen.  CM provided agency choice to pt - pt chose [employer] - agency accepts referral and will bring portable oxygen concentrator prior to discharge.  POC will allow pt to discharge home prior to home equipment being delivered.  CM signing off

## 2020-02-10 NOTE — Progress Notes (Signed)
SATURATION QUALIFICATIONS: (This note is used to comply with regulatory documentation for home oxygen)  Patient Saturations on Room Air at Rest  87 %  Patient Saturations on Room Air while Ambulating = 90%  Patient Saturations on 2 Liters of oxygen while Ambulating = 94%  Please briefly explain why patient needs home oxygen:

## 2020-02-10 NOTE — Progress Notes (Signed)
Crystal Dillon to be D/C'd Home per MD order.  Discussed with the patient and all questions fully answered.  VSS, Skin clean, dry and intact without evidence of skin break down, no evidence of skin tears noted. IV catheter discontinued intact. Site without signs and symptoms of complications. Dressing and pressure applied.  An After Visit Summary was printed and given to the patient. Patient received prescription.  D/c education completed with patient/family including follow up instructions, medication list, d/c activities limitations if indicated, with other d/c instructions as indicated by MD - patient able to verbalize understanding, all questions fully answered.   Patient instructed to return to ED, call 911, or call MD for any changes in condition.   Patient escorted via [place], and D/C home via private auto. Oxygen was deliver to the room and education given to the patient.  [name] [facility] 02/10/2020 7:02 PM

## 2020-02-10 NOTE — Progress Notes (Signed)
  Echocardiogram 2D Echocardiogram has been performed.  Crystal Dillon 02/10/2020, 2:30 PM

## 2020-02-11 ENCOUNTER — Other Ambulatory Visit: Payer: Self-pay | Admitting: Family Medicine

## 2020-02-11 DIAGNOSIS — Z8616 Personal history of COVID-19: Secondary | ICD-10-CM | POA: Diagnosis not present

## 2020-02-11 DIAGNOSIS — J9611 Chronic respiratory failure with hypoxia: Secondary | ICD-10-CM | POA: Diagnosis not present

## 2020-02-11 DIAGNOSIS — Z09 Encounter for follow-up examination after completed treatment for conditions other than malignant neoplasm: Secondary | ICD-10-CM | POA: Diagnosis not present

## 2020-02-11 DIAGNOSIS — R7401 Elevation of levels of liver transaminase levels: Secondary | ICD-10-CM | POA: Diagnosis not present

## 2020-02-11 LAB — HIV ANTIBODY (ROUTINE TESTING W REFLEX): HIV Screen 4th Generation wRfx: NONREACTIVE — AB

## 2020-02-11 NOTE — Discharge Summary (Signed)
 Name: Crystal Dillon MRN: [id number] DOB: 08/07/1966 53 y.o. PCP: Philip, Mathews K, MD  Date of Admission: 02/09/2020  9:07 AM Date of Discharge: 02/10/2020 Attending Physician: NARENDRA, NISCHAL  Discharge Diagnosis: 1. Pulmonary Embolism  Discharge Medications: Allergies as of 02/10/2020   No Known Allergies      Medication List     STOP taking these medications    azithromycin 250 MG tablet Commonly known as: ZITHROMAX   doxycycline 100 MG capsule Commonly known as: VIBRAMYCIN       TAKE these medications    acetaminophen 500 MG tablet Commonly known as: TYLENOL Take 1,000 mg by mouth every 6 (six) hours as needed for mild pain or headache.   albuterol 108 (90 Base) MCG/ACT inhaler Commonly known as: VENTOLIN HFA Inhale 2 puffs into the lungs every 6 (six) hours as needed for wheezing.   apixaban 5 MG Tabs tablet Commonly known as: Eliquis Take 2 tablets (10mg) twice daily for 7 days, then 1 tablet (5mg) twice daily   clobetasol cream 0.05 % Commonly known as: TEMOVATE Apply 1 application topically daily as needed (skin rash).   eletriptan 40 MG tablet Commonly known as: RELPAX TAKE 1 TABLET BY MOUTH AT ONSET OF HEADACHE MAY REPEAT IN 2 HOURS IF PERSISTS OR REOCCURS What changed: See the new instructions.   EPINEPHrine 0.3 mg/0.3 mL Soaj injection Commonly known as: Auvi-Q Use as directed for severe allergic reactions   famotidine 20 MG tablet Commonly known as: PEPCID TAKE 1 TABLET BY MOUTH TWICE A DAY What changed: when to take this   fexofenadine 180 MG tablet Commonly known as: ALLEGRA Take 180 mg by mouth daily as needed for allergies.   FLUoxetine 20 MG capsule Commonly known as: PROZAC TAKE 1 CAPSULE BY MOUTH EVERY DAY What changed: how much to take   fluticasone 50 MCG/ACT nasal spray Commonly known as: FLONASE SPRAY 2 SPRAYS INTO EACH NOSTRIL EVERY DAY What changed: See the new instructions.   gabapentin 100 MG  capsule Commonly known as: NEURONTIN Take 100 mg by mouth daily.   HYDROcodone-homatropine 5-1.5 MG/5ML syrup Commonly known as: HYCODAN Take 5 mLs by mouth every 8 (eight) hours as needed.   hydrOXYzine 25 MG tablet Commonly known as: ATARAX/VISTARIL Take 1 tablet (25 mg total) by mouth every 8 (eight) hours as needed.   meclizine 12.5 MG tablet Commonly known as: ANTIVERT Take 0.5-1 tablets (6.25-12.5 mg total) by mouth 3 (three) times daily as needed for dizziness.   montelukast 10 MG tablet Commonly known as: SINGULAIR TAKE 1 TABLET BY MOUTH EVERYDAY AT BEDTIME   ondansetron 4 MG tablet Commonly known as: ZOFRAN TAKE 1 TABLET BY MOUTH EVERY 8 HOURS AS NEEDED FOR NAUSEA OR VOMITING (MIGRAINES). What changed: See the new instructions.   One-A-Day Womens 50+ Advantage Tabs Take 1 tablet by mouth daily.        Disposition and follow-up:   [name] was discharged from [facility] in Stable condition.  At the hospital follow up visit please address:  1.    Pulmonary embolism       - Patient sent out with Apixaban starter pack. She was also sating 88% at rest and sent home with home oxygen.        - Likely provoked from recent COVID-19 infection, discuss risk/benefits at 3 months.        - Recommend age appropriate cancer screenings ( mammogram, pap smear, colonoscopy)    Transaminitis - Appears she has   had these lab abnormalities since Covid infection - CMP, monitor for resolution  2.  Labs / imaging needed at time of follow-up: CMP  3.  Pending labs/ test needing follow-up: na  Follow-up Appointments:    Hospital Course by problem list: 1.   #Pulmonary embolism Likely provoked from recent COVID-19 pneumonia.  No family history of clotting disorders or recent travel.  Borderline right heart strain noted on CT so patient was admitted. Seen by PCCM in ED who did not recommend thrombolytic therapy at this time with possible adverse  outcomes such as GI bleeding and intracranial hemorrhage. TTE did not show any right heart strain. Shortness of breath was improved the second day of admission. Ambulated with pulse ox and reported to Sat at 91%. However after patient sating 87% at rest. Sent out on 2 L of home oxygen.   #Transaminitis Appears normal per lab review prior to COVID 19 infection, 01/27/2020 AST 79 , ALT 69 ,  alk phos 222.  Increased on admission AST 190 , ALT 195, Alk phos 262. This has been seen in covid-19 infections.Trending down on admission. Plan to recommend PCP follow for resolution.    Discharge Vitals:   BP 140/85 (BP Location: Right Arm)   Pulse (!) 102   Temp 97.8 F (36.6 C) (Oral)   Resp 20   Ht 5' 1" (1.549 m)   Wt 51.5 kg   LMP 07/06/2011   SpO2 93%   BMI 21.45 kg/m   Pertinent Labs, Studies, and Procedures:  CBC Latest Ref Rng & Units 02/10/2020 02/09/2020 06/06/2019  WBC 4.0 - 10.5 Dillon/uL 7.3 6.4 5.6  Hemoglobin 12.0 - 15.0 g/dL 11.0(L) 12.3 12.4  Hematocrit 36.0 - 46.0 % 33.4(L) 38.3 38.4  Platelets 150 - 400 Dillon/uL 285 318 307   BMP Latest Ref Rng & Units 02/10/2020 02/09/2020 06/26/2019  Glucose 70 - 99 mg/dL 104(H) 117(H) 78  BUN 6 - 20 mg/dL _0 Creatinine 0.44 - 1.00 mg/dL 0.74 0.71 0.72  BUN/Creat Ratio 9 - 23 - - -  Sodium 135 - 145 mmol/L 133(L) 139 138  Potassium 3.5 - 5.1 mmol/L 4.1 3.8 4.4  Chloride 98 - 111 mmol/L 97(L) 100 102  CO2 22 - 32 mmol/L _1 Calcium 8.9 - 10.3 mg/dL 9.0 9.9 9.4   CLINICAL DATA:  COVID positive 01/20/2020, shortness of breath, elevated D-dimer  EXAM: CT ANGIOGRAPHY CHEST WITH CONTRAST  TECHNIQUE: Multidetector CT imaging of the chest was performed using the standard protocol during bolus administration of intravenous contrast. Multiplanar CT image reconstructions and MIPs were obtained to evaluate the vascular anatomy.  CONTRAST:  1m OMNIPAQUE IOHEXOL 350 MG/ML SOLN  COMPARISON:  02/09/2020 chest  x-ray  FINDINGS: Cardiovascular: Hypodense tubular nearly occlusive filling defects within the hilar right pulmonary artery extending into the lobar branches of the right lung compatible with unilateral acute right pulmonary emboli.  Left pulmonary vasculature appears spared and patent.  RV to LV ratio is 1.0.  No pericardial effusion. Intact aorta. Major branch vessels are patent. No aneurysm or dissection. No mediastinal hemorrhage or hematoma.  Mediastinum/Nodes: Mildly enlarged paratracheal and hilar lymph nodes noted bilaterally, suspect infectious/inflammatory. No bulky adenopathy.  Thyroid, trachea and esophagus appear unremarkable. Esophagus is nondilated.  Lungs/Pleura: Multifocal peripheral/subpleural areas of patchy mixed interstitial and airspace disease noted compatible with atypical or viral pneumonia. Patient is noted to be COVID positive. There is also some degree of associated basilar atelectasis.  Trace pleural effusions.  No pneumothorax.  Upper Abdomen: Central hepatic cyst close to the hepatic IVC confluence measures 1.7 cm. No acute upper abdominal finding.  Musculoskeletal: No acute osseous finding.  Review of the MIP images confirms the above findings.  IMPRESSION: Acute right hilar and central lobar unilateral pulmonary emboli with borderline heart strain. RV LV ratio 1.0 consistent with at least submassive (intermediate risk) PE. The presence of right heart strain has been associated with an increased risk of morbidity and mortality. Please activate Code PE by paging [phone].  Multifocal bilateral mixed interstitial and airspace opacities compatible with known COVID pneumonia.  Associated bibasilar atelectasis  Trace pleural effusions  Discharge Instructions: Discharge Instructions     Increase activity slowly   Complete by: As directed        Signed: Steen, James, MD 02/11/2020, 5:40 PM   Pager:  @[email or web address]@  

## 2020-02-26 DIAGNOSIS — Z8616 Personal history of COVID-19: Secondary | ICD-10-CM | POA: Diagnosis not present

## 2020-02-26 DIAGNOSIS — R0602 Shortness of breath: Secondary | ICD-10-CM | POA: Diagnosis not present

## 2020-02-26 DIAGNOSIS — J302 Other seasonal allergic rhinitis: Secondary | ICD-10-CM | POA: Diagnosis not present

## 2020-02-26 DIAGNOSIS — I2699 Other pulmonary embolism without acute cor pulmonale: Secondary | ICD-10-CM | POA: Diagnosis not present

## 2020-02-27 DIAGNOSIS — R9389 Abnormal findings on diagnostic imaging of other specified body structures: Secondary | ICD-10-CM | POA: Diagnosis not present

## 2020-02-27 DIAGNOSIS — D72819 Decreased white blood cell count, unspecified: Secondary | ICD-10-CM | POA: Diagnosis not present

## 2020-02-27 DIAGNOSIS — I2699 Other pulmonary embolism without acute cor pulmonale: Secondary | ICD-10-CM | POA: Diagnosis not present

## 2020-02-27 DIAGNOSIS — R945 Abnormal results of liver function studies: Secondary | ICD-10-CM | POA: Diagnosis not present

## 2020-02-27 DIAGNOSIS — Z8616 Personal history of COVID-19: Secondary | ICD-10-CM | POA: Diagnosis not present

## 2020-03-02 DIAGNOSIS — I2699 Other pulmonary embolism without acute cor pulmonale: Secondary | ICD-10-CM | POA: Diagnosis not present

## 2020-03-02 DIAGNOSIS — Z8616 Personal history of COVID-19: Secondary | ICD-10-CM | POA: Diagnosis not present

## 2020-03-02 DIAGNOSIS — Z79899 Other long term (current) drug therapy: Secondary | ICD-10-CM | POA: Diagnosis not present

## 2020-03-03 DIAGNOSIS — Z01419 Encounter for gynecological examination (general) (routine) without abnormal findings: Secondary | ICD-10-CM | POA: Diagnosis not present

## 2020-03-03 DIAGNOSIS — Z6821 Body mass index (BMI) 21.0-21.9, adult: Secondary | ICD-10-CM | POA: Diagnosis not present

## 2020-03-03 DIAGNOSIS — Z1231 Encounter for screening mammogram for malignant neoplasm of breast: Secondary | ICD-10-CM | POA: Diagnosis not present

## 2020-03-06 DIAGNOSIS — Z13820 Encounter for screening for osteoporosis: Secondary | ICD-10-CM | POA: Diagnosis not present

## 2020-03-09 DIAGNOSIS — U071 COVID-19: Secondary | ICD-10-CM | POA: Diagnosis not present

## 2020-03-17 ENCOUNTER — Ambulatory Visit: Payer: BC Managed Care – PPO | Admitting: Allergy

## 2020-03-30 DIAGNOSIS — R9389 Abnormal findings on diagnostic imaging of other specified body structures: Secondary | ICD-10-CM | POA: Diagnosis not present

## 2020-03-31 ENCOUNTER — Ambulatory Visit: Payer: Self-pay | Admitting: Allergy

## 2020-03-31 DIAGNOSIS — M810 Age-related osteoporosis without current pathological fracture: Secondary | ICD-10-CM | POA: Diagnosis not present

## 2020-04-07 ENCOUNTER — Ambulatory Visit (INDEPENDENT_AMBULATORY_CARE_PROVIDER_SITE_OTHER): Payer: BC Managed Care – PPO | Admitting: Allergy

## 2020-04-07 ENCOUNTER — Other Ambulatory Visit: Payer: Self-pay

## 2020-04-07 ENCOUNTER — Encounter: Payer: Self-pay | Admitting: Allergy

## 2020-04-07 VITALS — BP 110/78 | HR 56 | Temp 99.4°F | Resp 10

## 2020-04-07 DIAGNOSIS — L508 Other urticaria: Secondary | ICD-10-CM | POA: Diagnosis not present

## 2020-04-07 NOTE — Progress Notes (Signed)
Follow-up Note  RE: Crystal Dillon MRN: [id number] DOB: [birth year] Date of Office Visit: 04/07/2020   History of present illness: Crystal Dillon is a 54 y.o. female presenting today for follow-up of chronic hives. She was last seen on 12/31/2019 by Dr. Nelva Bush.   She reports that in February she had COVID-19 for 8 weeks and was hospitalized for a week. She did not require intubation. She had pneumonia and blood clots in her lungs. She did receive her first Moderna vaccine and will receive her second injection in 2 weeks.  She reports that her hives are a lot better now. She is currently taking Allegra 180 mg in the morning and Xyzal 5 mg at night. She only received one Xolair injection before getting COVID-19. Currently she has a few hives on bilateral arms, left and and right foot. She could not tell a difference with the use of Pepcid 20 mg twice a day or Singulair 10 mg once a day, so she stopped those. She would like to hold off on further Xolair injections for now to see how she does.   Review of systems: Review of Systems  Constitutional: Negative for chills and fever.  HENT: Negative.   Eyes: Negative.   Respiratory: Negative.   Skin: Positive for itching and rash.    All other systems negative unless noted above in HPI  Past medical/social/surgical/family history have been reviewed and are unchanged unless specifically indicated below.  No changes  Medication List: Current Outpatient Medications  Medication Sig Dispense Refill  . albuterol (VENTOLIN HFA) 108 (90 Base) MCG/ACT inhaler Inhale 2 puffs into the lungs every 6 (six) hours as needed for wheezing.    Marland Kitchen apixaban (ELIQUIS) 5 MG TABS tablet Take 2 tablets (10mg ) twice daily for 7 days, then 1 tablet (5mg ) twice daily 60 tablet 0  . Cholecalciferol (VITAMIN D3 PO) Take 3,000 Units by mouth daily.    . clobetasol cream (TEMOVATE) [id number] % Apply 1 application topically daily as needed (skin rash).     Marland Kitchen eletriptan  (RELPAX) 40 MG tablet TAKE 1 TABLET BY MOUTH AT ONSET OF HEADACHE MAY REPEAT IN 2 HOURS IF PERSISTS OR REOCCURS (Patient taking differently: Take 40 mg by mouth once. ) 9 tablet 1  . EPINEPHrine (AUVI-Q) 0.3 mg/0.3 mL IJ SOAJ injection Use as directed for severe allergic reactions 2 each 1  . Ferrous Sulfate (IRON PO) Take by mouth daily.    . fexofenadine (ALLEGRA) 180 MG tablet Take 180 mg by mouth daily as needed for allergies.     Marland Kitchen FLUoxetine (PROZAC) 20 MG capsule TAKE 1 CAPSULE BY MOUTH EVERY DAY (Patient taking differently: Take 20 mg by mouth daily. ) 90 capsule 1  . fluticasone (FLONASE) 50 MCG/ACT nasal spray SPRAY 2 SPRAYS INTO EACH NOSTRIL EVERY DAY (Patient taking differently: Place 2 sprays into both nostrils daily as needed for allergies. ) 48 mL 1  . hydrOXYzine (ATARAX/VISTARIL) 25 MG tablet Take 1 tablet (25 mg total) by mouth every 8 (eight) hours as needed. 30 tablet 0  . meclizine (ANTIVERT) 12.5 MG tablet Take 0.5-1 tablets (6.25-12.5 mg total) by mouth 3 (three) times daily as needed for dizziness. 30 tablet 2  . Multiple Vitamins-Minerals (ONE-A-DAY WOMENS 50+ ADVANTAGE) TABS Take 1 tablet by mouth daily.     . ondansetron (ZOFRAN) 4 MG tablet TAKE 1 TABLET BY MOUTH EVERY 8 HOURS AS NEEDED FOR NAUSEA OR VOMITING (MIGRAINES). (Patient taking differently: Take 4 mg by mouth every 8 (  eight) hours as needed for nausea or vomiting. ) 20 tablet 0  . triamcinolone cream (KENALOG) 0.1 % Apply 1 application topically as needed.    . famotidine (PEPCID) 20 MG tablet TAKE 1 TABLET BY MOUTH TWICE A DAY (Patient not taking: No sig reported) 180 tablet 1  . gabapentin (NEURONTIN) 100 MG capsule Take 100 mg by mouth daily.     . montelukast (SINGULAIR) 10 MG tablet TAKE 1 TABLET BY MOUTH EVERYDAY AT BEDTIME (Patient not taking: Reported on 02/09/2020) 90 tablet 1   Current Facility-Administered Medications  Medication Dose Route Frequency Provider Last Rate Last Admin  . omalizumab Arvid Right)  prefilled syringe 300 mg  300 mg Subcutaneous Q28 days Kennith Gain, MD   300 mg at 12/31/19 1630     Known medication allergies: No Known Allergies   Physical examination: Blood pressure 110/78, pulse (!) 56, temperature 99.4 F (37.4 C), temperature source Temporal, resp. rate 10, last menstrual period 07/06/2011, SpO2 97 %.  General: Alert, interactive, in no acute distress. HEENT: TMs pearly gray, turbinates non-edematous without discharge, post-pharynx unremarkable. Neck: Supple without lymphadenopathy. Lungs: Clear to auscultation without wheezing, rhonchi or rales.  CV: Normal S1, S2 without murmurs. Abdomen: Nondistended, nontender. Skin: scaring noted on bilateral arms along with scabs. scabbin on right ventral foot.. Extremities:  No clubbing, cyanosis or edema. Neuro:   Grossly intact.   Assessment and plan:   Chronic urticaria   - labwork done at initial visit shows that you make autoantibodies that target your allergy cells leading to chronic hives.  This is an autoimmune form of hives.  Hives can be longer lasting and harder treat.   - hives however have been improved  - continue Allergra and Xyzal daily  - if hives worsen then would resume use of Pepcid 20mg  1 tablet twice a day and Singulair 10mg  daily as well as resume use of Xolair monthly injections    - keep skin moisturized to help decrease itch  Follow-up 4-6 months or sooner if needed  Thank you for the opportunity to take part in Crystal Dillon's care. Please do not hesitate to contact me with questions.  Althea Charon, [facility]  Addendum:  I performed/discussed the history and physical examination of the patient as well as management with NP Ambs. I reviewed the NP's note and agree with the documented findings and plan of care with following additions/exceptions: none  Prudy Feeler, MD [facility]

## 2020-04-07 NOTE — Patient Instructions (Addendum)
Chronic hives   - labwork done at initial visit shows that you make autoantibodies that target your allergy cells leading to chronic hives.  This is an autoimmune form of hives.  Hives can be longer lasting and harder treat.   - hives however have been improved  - continue Allergra and Xyzal daily  - if hives worsen then would resume use of Pepcid 20mg  1 tablet twice a day and Singulair 10mg  daily as well as resume use of Xolair monthly injections    - keep skin moisturized to help decrease itch  Follow-up 4-6 months or sooner if needed

## 2020-04-09 DIAGNOSIS — U071 COVID-19: Secondary | ICD-10-CM | POA: Diagnosis not present

## 2020-05-09 DIAGNOSIS — U071 COVID-19: Secondary | ICD-10-CM | POA: Diagnosis not present

## 2020-05-19 DIAGNOSIS — M810 Age-related osteoporosis without current pathological fracture: Secondary | ICD-10-CM | POA: Diagnosis not present

## 2020-05-19 DIAGNOSIS — R0789 Other chest pain: Secondary | ICD-10-CM | POA: Diagnosis not present

## 2020-05-19 DIAGNOSIS — R079 Chest pain, unspecified: Secondary | ICD-10-CM | POA: Diagnosis not present

## 2020-06-08 DIAGNOSIS — Z86711 Personal history of pulmonary embolism: Secondary | ICD-10-CM | POA: Diagnosis not present

## 2020-06-08 DIAGNOSIS — Z09 Encounter for follow-up examination after completed treatment for conditions other than malignant neoplasm: Secondary | ICD-10-CM | POA: Diagnosis not present

## 2020-06-08 DIAGNOSIS — I2699 Other pulmonary embolism without acute cor pulmonale: Secondary | ICD-10-CM | POA: Diagnosis not present

## 2020-06-08 DIAGNOSIS — Z7902 Long term (current) use of antithrombotics/antiplatelets: Secondary | ICD-10-CM | POA: Diagnosis not present

## 2020-06-08 DIAGNOSIS — Z8616 Personal history of COVID-19: Secondary | ICD-10-CM | POA: Diagnosis not present

## 2020-06-17 ENCOUNTER — Other Ambulatory Visit: Payer: Self-pay | Admitting: Family Medicine

## 2020-07-18 ENCOUNTER — Other Ambulatory Visit: Payer: Self-pay | Admitting: Family Medicine

## 2020-07-27 DIAGNOSIS — F411 Generalized anxiety disorder: Secondary | ICD-10-CM | POA: Diagnosis not present

## 2020-07-27 DIAGNOSIS — I2699 Other pulmonary embolism without acute cor pulmonale: Secondary | ICD-10-CM | POA: Diagnosis not present

## 2020-07-27 DIAGNOSIS — M5136 Other intervertebral disc degeneration, lumbar region: Secondary | ICD-10-CM | POA: Diagnosis not present

## 2020-07-27 DIAGNOSIS — E538 Deficiency of other specified B group vitamins: Secondary | ICD-10-CM | POA: Diagnosis not present

## 2020-08-07 ENCOUNTER — Other Ambulatory Visit: Payer: Self-pay | Admitting: Family Medicine

## 2020-08-11 DIAGNOSIS — Z1152 Encounter for screening for COVID-19: Secondary | ICD-10-CM | POA: Diagnosis not present

## 2020-08-11 DIAGNOSIS — J189 Pneumonia, unspecified organism: Secondary | ICD-10-CM | POA: Diagnosis not present

## 2020-08-11 DIAGNOSIS — R0602 Shortness of breath: Secondary | ICD-10-CM | POA: Diagnosis not present

## 2020-08-11 DIAGNOSIS — M549 Dorsalgia, unspecified: Secondary | ICD-10-CM | POA: Diagnosis not present

## 2020-08-12 DIAGNOSIS — J189 Pneumonia, unspecified organism: Secondary | ICD-10-CM | POA: Diagnosis not present

## 2020-08-12 DIAGNOSIS — I2699 Other pulmonary embolism without acute cor pulmonale: Secondary | ICD-10-CM | POA: Diagnosis not present

## 2020-08-12 DIAGNOSIS — M549 Dorsalgia, unspecified: Secondary | ICD-10-CM | POA: Diagnosis not present

## 2020-08-12 DIAGNOSIS — R05 Cough: Secondary | ICD-10-CM | POA: Diagnosis not present

## 2020-08-27 ENCOUNTER — Ambulatory Visit: Payer: BC Managed Care – PPO | Admitting: Physician Assistant

## 2020-08-27 ENCOUNTER — Other Ambulatory Visit: Payer: Self-pay

## 2020-08-27 ENCOUNTER — Encounter: Payer: Self-pay | Admitting: Physician Assistant

## 2020-08-27 VITALS — BP 130/88 | HR 70 | Resp 12 | Ht 61.0 in | Wt 119.0 lb

## 2020-08-27 DIAGNOSIS — R748 Abnormal levels of other serum enzymes: Secondary | ICD-10-CM

## 2020-08-27 DIAGNOSIS — F411 Generalized anxiety disorder: Secondary | ICD-10-CM

## 2020-08-27 DIAGNOSIS — R768 Other specified abnormal immunological findings in serum: Secondary | ICD-10-CM

## 2020-08-27 DIAGNOSIS — M19041 Primary osteoarthritis, right hand: Secondary | ICD-10-CM | POA: Diagnosis not present

## 2020-08-27 DIAGNOSIS — M19071 Primary osteoarthritis, right ankle and foot: Secondary | ICD-10-CM

## 2020-08-27 DIAGNOSIS — M722 Plantar fascial fibromatosis: Secondary | ICD-10-CM

## 2020-08-27 DIAGNOSIS — Z8669 Personal history of other diseases of the nervous system and sense organs: Secondary | ICD-10-CM

## 2020-08-27 DIAGNOSIS — M7702 Medial epicondylitis, left elbow: Secondary | ICD-10-CM

## 2020-08-27 DIAGNOSIS — Z8379 Family history of other diseases of the digestive system: Secondary | ICD-10-CM

## 2020-08-27 DIAGNOSIS — L501 Idiopathic urticaria: Secondary | ICD-10-CM

## 2020-08-27 DIAGNOSIS — M7701 Medial epicondylitis, right elbow: Secondary | ICD-10-CM

## 2020-08-27 DIAGNOSIS — I7300 Raynaud's syndrome without gangrene: Secondary | ICD-10-CM | POA: Diagnosis not present

## 2020-08-27 DIAGNOSIS — M170 Bilateral primary osteoarthritis of knee: Secondary | ICD-10-CM

## 2020-08-27 DIAGNOSIS — Z86711 Personal history of pulmonary embolism: Secondary | ICD-10-CM

## 2020-08-27 DIAGNOSIS — M7062 Trochanteric bursitis, left hip: Secondary | ICD-10-CM

## 2020-08-27 DIAGNOSIS — M19042 Primary osteoarthritis, left hand: Secondary | ICD-10-CM

## 2020-08-27 DIAGNOSIS — M7061 Trochanteric bursitis, right hip: Secondary | ICD-10-CM

## 2020-08-27 DIAGNOSIS — M5136 Other intervertebral disc degeneration, lumbar region: Secondary | ICD-10-CM

## 2020-08-27 NOTE — Progress Notes (Addendum)
Office Visit Note  Patient: Crystal Dillon             Date of Birth: 10/04/1966           MRN: [id number]             PCP: Marijo File, MD Referring: Marijo File, MD Visit Date: 08/27/2020 Occupation: _0 @  Subjective:  Pain in multiple joints  History of Present Illness: Crystal Dillon is a 54 y.o. female with history of positive ANA, Raynaud's syndrome, and osteoarthritis.  Patient reports that she has been experiencing increased pain and stiffness in multiple joints over the past several months.  She states the pain is most severe in both hands, both elbows, and bilateral trochanteric bursa.  She has occasional discomfort in her shoulder joints and knee joints.  She states that she has noticed intermittent joint swelling in both hands and both knees.  She states that her morning stiffness is lasting several hours on a daily basis.  She states that her nocturnal pain is the worst of her symptoms currently.  Patient reports that she feels as though her joint pain and stiffness has been worsening since being diagnosed with COVID-19 in February 2021.  Patient reports that she was hospitalized and diagnosed with double pneumonia and 2 pulmonary embolisms.  She is currently taking Eliquis.  She reports she is also been experiencing increased sicca symptoms, hair loss She denies any oral or nasal ulcerations.  She experiences photosensitivity and intermittent urticaria.  She is receiving Xolair injections and is followed by asthma and allergy.   Activities of Daily Living:  Patient reports morning stiffness for several  hours.   Patient Reports nocturnal pain.  Difficulty dressing/grooming: Denies Difficulty climbing stairs: Denies Difficulty getting out of chair: Denies Difficulty using hands for taps, buttons, cutlery, and/or writing: Denies  Review of Systems  Constitutional: Positive for fatigue.  HENT: Positive for mouth dryness. Negative for mouth sores and nose  dryness.   Eyes: Positive for dryness. Negative for pain, itching and visual disturbance.  Respiratory: Negative for cough, hemoptysis, shortness of breath and difficulty breathing.   Cardiovascular: Negative for chest pain, palpitations, hypertension and swelling in legs/feet.  Gastrointestinal: Negative for blood in stool, constipation and diarrhea.  Endocrine: Negative for increased urination.  Genitourinary: Negative for difficulty urinating and painful urination.  Musculoskeletal: Positive for arthralgias, joint pain, joint swelling, morning stiffness and muscle tenderness. Negative for myalgias, muscle weakness and myalgias.  Skin: Positive for rash. Negative for color change, pallor, hair loss, nodules/bumps, skin tightness, ulcers and sensitivity to sunlight.  Allergic/Immunologic: Negative for susceptible to infections.  Neurological: Negative for dizziness, numbness, headaches and weakness.  Hematological: Positive for bruising/bleeding tendency. Negative for swollen glands.  Psychiatric/Behavioral: Positive for sleep disturbance. Negative for depressed mood and confusion. The patient is not nervous/anxious.     PMFS History:  Patient Active Problem List   Diagnosis Date Noted  . Pulmonary embolism ([place]) 02/09/2020  . Primary osteoarthritis of both knees 12/14/2018  . Hx of migraines 11/23/2018  . Raynaud's syndrome without gangrene 11/23/2018  . Chronic urticaria 11/23/2018  . Family history of inflammatory bowel disease 11/23/2018  . Upper respiratory infection 02/28/2017  . Fibroids 09/20/2016  . Anxiety state 09/20/2016  . DDD (degenerative disc disease), lumbar 09/20/2016  . Migraine 09/20/2016  . Left hip pain 09/20/2016  . Chronic pain of both knees 09/20/2016  . Hyperglycemia 09/20/2016    Past Medical History:  Diagnosis Date  . Anemia   .  Anxiety   . Chronic urticaria 11/23/2018  . Complication of anesthesia    Blood pressure drops low  . COVID-19 virus  infection 01/2020  . Environmental and seasonal allergies   . GERD (gastroesophageal reflux disease)   . Migraine   . OA (osteoarthritis)   . Osteoporosis    Dr. Julien Girt  . PONV (postoperative nausea and vomiting)   . RAYNAUDS SYNDROME 01/06/2011   Qualifier: Diagnosis of  By: Arnoldo Morale MD, Balinda Quails   . Uterine fibroid    diagnosed by OB/GYN Dr Mayra Neer  . Varicose veins of left lower extremity with both ulcer of ankle and inflammation ([place]) 07/07/2011   Referral to interventional radiology for vascular study and potential intervention   . Vertigo     Family History  Problem Relation Age of Onset  . COPD Mother   . Hyperlipidemia Mother   . Diabetes Mother   . Cancer Father        lung, smoker  . Diabetes Father   . Stroke Father   . Colon cancer Maternal Grandmother   . Diabetes Maternal Grandmother   . Healthy Sister   . Heart Problems Brother   . Crohn's disease Son   . Ulcerative colitis Daughter    Past Surgical History:  Procedure Laterality Date  . ADENOIDECTOMY    . CESAREAN SECTION    . DENTAL SURGERY    . HYSTERECTOMY ABDOMINAL WITH SALPINGO-OOPHORECTOMY Bilateral 06/13/2019   Procedure: HYSTERECTOMY ABDOMINAL WITH SALPINGO-OOPHORECTOMY;  Surgeon: Marylynn Pearson, MD;  Location: [facility];  Service: Gynecology;  Laterality: Bilateral;  . HYSTEROSCOPY    . TONSILLECTOMY    . TUBAL LIGATION     Social History   Social History Narrative   Work or School: acounting       Home Situation: lives with husband and son 56 yo, and niece whom is 63 yo      Spiritual Beliefs: Christian      Lifestyle:trying to eat healthy; no regular exercise   Immunization History  Administered Date(s) Administered  . Influenza,inj,Quad PF,6+ Mos 09/20/2016, 10/03/2017  . Moderna SARS-COVID-2 Vaccination 04/02/2020, 05/02/2020  . Td 09/20/2005, 10/03/2017     Objective: Vital Signs: BP 130/88 (BP Location: Left Arm, Patient Position: Sitting, Cuff Size:  Normal)   Pulse 70   Resp 12   Ht _0  (1.549 m)   Wt 119 lb (54 kg)   LMP 07/06/2011   BMI 22.48 kg/m    Physical Exam Vitals and nursing note reviewed.  Constitutional:      Appearance: She is well-developed.  HENT:     Head: Normocephalic and atraumatic.  Eyes:     Conjunctiva/sclera: Conjunctivae normal.  Pulmonary:     Effort: Pulmonary effort is normal.  Abdominal:     Palpations: Abdomen is soft.  Musculoskeletal:     Cervical back: Normal range of motion.  Skin:    General: Skin is warm and dry.     Capillary Refill: Capillary refill takes less than 2 seconds.  Neurological:     Mental Status: She is alert and oriented to person, place, and time.  Psychiatric:        Behavior: Behavior normal.      Musculoskeletal Exam: C-spine, thoracic spine, and lumbar spine have good range of motion.  Shoulder joint abduction to about 120 degrees bilaterally.  She has tenderness palpation over bilateral medial epicondyles.  Wrist joints have good range of motion with no tenderness or synovitis.  She has  tenderness of several MCP and PIP joints as described below.  Hip joints have good range of motion.  She has tenderness to palpation over both trochanteric bursa.  Knee joints have good range of motion with no warmth or effusion.  Ankle joints have good range of motion with no tenderness or inflammation.  CDAI Exam: CDAI Score: -- Patient Global: --; Provider Global: -- Swollen: 0 ; Tender: 16  Joint Exam 08/27/2020      Right  Left  Glenohumeral   Tender   Tender  MCP 1   Tender   Tender  MCP 2   Tender   Tender  MCP 3   Tender   Tender  MCP 4   Tender   Tender  MCP 5   Tender   Tender  PIP 2      Tender  PIP 3   Tender   Tender  PIP 4   Tender        Investigation: No additional findings.  Imaging: No results found.  Recent Labs: Lab Results  Component Value Date   WBC 7.3 02/10/2020   HGB 11.0 (L) 02/10/2020   PLT 285 02/10/2020   NA 133 (L) 02/10/2020    K 4.1 02/10/2020   CL 97 (L) 02/10/2020   CO2 25 02/10/2020   GLUCOSE 104 (H) 02/10/2020   BUN 7 02/10/2020   CREATININE 0.74 02/10/2020   BILITOT 0.7 02/10/2020   ALKPHOS 222 (H) 02/10/2020   AST 90 (H) 02/10/2020   ALT 144 (H) 02/10/2020   PROT 6.8 02/10/2020   ALBUMIN 3.0 (L) 02/10/2020   CALCIUM 9.0 02/10/2020   GFRAA >60 02/10/2020    Speciality Comments: No specialty comments available.  Procedures:  No procedures performed Allergies: Patient has no known allergies.   Assessment / Plan:     Visit Diagnoses: Positive ANA (antinuclear antibody) - [facility] 11/23/18: Negative index: -1.9.  ANA negative: [facility] labs from 11/23/2018 were reviewed today in the office.  The patient was last evaluated in the office on 12/26/2018.  She has started to develop some new or worsening symptoms since her last visit which have progressively been getting worse since she was diagnosed with COVID-19 in February 2021.  She was hospitalized and treated for double pneumonia and pulmonary embolism.  She is now taking Eliquis as prescribed.  Since the resolution of the infection she is been experiencing increased pain in multiple joints.  She has noticed intermittent joint swelling in both hands and both knee joints.  On examination today she has tenderness of several MCP and PIP joints as described above but no synovitis was noted.  We will schedule an ultrasound of both hands to assess for synovitis.  She has also been experiencing ongoing fatigue, sicca symptoms, symptoms of Raynaud's, photosensitivity, and urticaria.  Due to her history of positive ANA and new and worsening symptoms we will repeat [facility] lab work.  She will follow-up in about 1 month to discuss ultrasound and lab results.  She was advised to notify us if she develops any new or worsening symptoms in the meantime.  Chronic idiopathic urticaria - Followed by Dr. Nelva Bush.Current regimen: Xolair, Allegra, Xyzal, Pepcid, and Singulair.  She continues  to experience intermittent urticaria despite the current regimen.  Raynaud's disease without gangrene: She experiences intermittent symptoms of Raynaud's.  No digital ulcerations or signs of gangrene were noted on exam.  She has no skin thickening or skin tightness.  No obvious nailbed capillary changes were noted.  We will obtain [facility] lab work to complete the workup.   Primary osteoarthritis of both hands: She has mild PIP and DIP thickening consistent with osteoarthritis of both hands.  She is able to make a complete fist bilaterally.  She has been experiencing increased pain and stiffness in her hands.  Most of her discomfort is in her MCP joints.  She has tenderness to palpation of several MCP and PIP joints as described above.  We will schedule an ultrasound of both hands to assess for synovitis. She was given a handout of hand exercises to perform.  We discussed the importance of joint protection and muscle strengthening.  Primary osteoarthritis of both knees: She has been experiencing increased pain in both knee joints.  She has no warmth or effusion on examination today.  Primary osteoarthritis of right foot: She has been experiencing increased pain in both feet.  No synovitis or dactylitis was noted on exam.  Plantar fasciitis: She has intermittent symptoms of plantar fasciitis.  DDD (degenerative disc disease), lumbar: She has chronic lower back pain.  She does not have any symptoms of radiculopathy at this time.  Elevated alkaline phosphatase level - Alk phos 122 on 02/10/20  History of pulmonary embolism - Dx with Covid-19 in February 2021-hospitalized with pneumonia + PE.  Started on Eliquis.  Trochanteric bursitis of both hips: She presents today with discomfort due to trochanteric bursitis bilaterally.  She has tenderness to palpation on examination today.  She has good range of motion of both hip joints on exam.  She was given a handout of exercises to perform.  We also discussed  that physical therapy is an option in the future if her symptoms persist or worsen.  We can also perform a cortisone injection in the future if her pain progresses.  Medial epicondylitis of both elbows: She has tenderness palpation over the medial epicondyle of both elbow joints.  We discussed the diagnosis of medial epicondylitis.  She was given a handout of exercises to perform.  She was advised to notify us if her symptoms persist or worsen and we can refer her to physical therapy.  Other medical conditions are listed as follows:  Anxiety state  History of migraine  Hx of migraines  Family history of inflammatory bowel disease  Orders: No orders of the defined types were placed in this encounter.  No orders of the defined types were placed in this encounter.     Follow-Up Instructions: Return for +ANA, Polyarthralgia.   Ofilia Neas, PA-C  Note - This record has been created using Dragon software.  Chart creation errors have been sought, but may not always  have been located. Such creation errors do not reflect on  the standard of medical care.

## 2020-08-27 NOTE — Patient Instructions (Signed)
Hand Exercises Hand exercises can be helpful for almost anyone. These exercises can strengthen the hands, improve flexibility and movement, and increase blood flow to the hands. These results can make work and daily tasks easier. Hand exercises can be especially helpful for people who have joint pain from arthritis or have nerve damage from overuse (carpal tunnel syndrome). These exercises can also help people who have injured a hand. Exercises Most of these hand exercises are gentle stretching and motion exercises. It is usually safe to do them often throughout the day. Warming up your hands before exercise may help to reduce stiffness. You can do this with gentle massage or by placing your hands in warm water for 10-15 minutes. It is normal to feel some stretching, pulling, tightness, or mild discomfort as you begin new exercises. This will gradually improve. Stop an exercise right away if you feel sudden, severe pain or your pain gets worse. Ask your health care provider which exercises are best for you. Knuckle bend or "claw" fist 1. Stand or sit with your arm, hand, and all five fingers pointed straight up. Make sure to keep your wrist straight during the exercise. 2. Gently bend your fingers down toward your palm until the tips of your fingers are touching the top of your palm. Keep your big knuckle straight and just bend the small knuckles in your fingers. 3. Hold this position for __________ seconds. 4. Straighten (extend) your fingers back to the starting position. Repeat this exercise 5-10 times with each hand. Full finger fist 1. Stand or sit with your arm, hand, and all five fingers pointed straight up. Make sure to keep your wrist straight during the exercise. 2. Gently bend your fingers into your palm until the tips of your fingers are touching the middle of your palm. 3. Hold this position for __________ seconds. 4. Extend your fingers back to the starting position, stretching every  joint fully. Repeat this exercise 5-10 times with each hand. Straight fist 1. Stand or sit with your arm, hand, and all five fingers pointed straight up. Make sure to keep your wrist straight during the exercise. 2. Gently bend your fingers at the big knuckle, where your fingers meet your hand, and the middle knuckle. Keep the knuckle at the tips of your fingers straight and try to touch the bottom of your palm. 3. Hold this position for __________ seconds. 4. Extend your fingers back to the starting position, stretching every joint fully. Repeat this exercise 5-10 times with each hand. Tabletop 1. Stand or sit with your arm, hand, and all five fingers pointed straight up. Make sure to keep your wrist straight during the exercise. 2. Gently bend your fingers at the big knuckle, where your fingers meet your hand, as far down as you can while keeping the small knuckles in your fingers straight. Think of forming a tabletop with your fingers. 3. Hold this position for __________ seconds. 4. Extend your fingers back to the starting position, stretching every joint fully. Repeat this exercise 5-10 times with each hand. Finger spread 1. Place your hand flat on a table with your palm facing down. Make sure your wrist stays straight as you do this exercise. 2. Spread your fingers and thumb apart from each other as far as you can until you feel a gentle stretch. Hold this position for __________ seconds. 3. Bring your fingers and thumb tight together again. Hold this position for __________ seconds. Repeat this exercise 5-10 times with each hand.   Making circles 1. Stand or sit with your arm, hand, and all five fingers pointed straight up. Make sure to keep your wrist straight during the exercise. 2. Make a circle by touching the tip of your thumb to the tip of your index finger. 3. Hold for __________ seconds. Then open your hand wide. 4. Repeat this motion with your thumb and each finger on your  hand. Repeat this exercise 5-10 times with each hand. Thumb motion 1. Sit with your forearm resting on a table and your wrist straight. Your thumb should be facing up toward the ceiling. Keep your fingers relaxed as you move your thumb. 2. Lift your thumb up as high as you can toward the ceiling. Hold for __________ seconds. 3. Bend your thumb across your palm as far as you can, reaching the tip of your thumb for the small finger (pinkie) side of your palm. Hold for __________ seconds. Repeat this exercise 5-10 times with each hand. Grip strengthening  1. Hold a stress ball or other soft ball in the middle of your hand. 2. Slowly increase the pressure, squeezing the ball as much as you can without causing pain. Think of bringing the tips of your fingers into the middle of your palm. All of your finger joints should bend when doing this exercise. 3. Hold your squeeze for __________ seconds, then relax. Repeat this exercise 5-10 times with each hand. Contact a health care provider if:  Your hand pain or discomfort gets much worse when you do an exercise.  Your hand pain or discomfort does not improve within 2 hours after you exercise. If you have any of these problems, stop doing these exercises right away. Do not do them again unless your health care provider says that you can. Get help right away if:  You develop sudden, severe hand pain or swelling. If this happens, stop doing these exercises right away. Do not do them again unless your health care provider says that you can. This information is not intended to replace advice given to you by your health care provider. Make sure you discuss any questions you have with your health care provider. Document Revised: 03/28/2019 Document Reviewed: 12/06/2018 Elsevier Patient Education  [place] Ask your health care provider which exercises are safe for you. Do exercises exactly as told by your health care provider  and adjust them as directed. It is normal to feel mild stretching, pulling, tightness, or discomfort as you do these exercises. Stop right away if you feel sudden pain or your pain gets worse. Do not begin these exercises until told by your health care provider. Stretching and range-of-motion exercises These exercises warm up your muscles and joints and improve the movement and flexibility of your elbow. Wrist extension  1. Straighten your left / right elbow in front of you with your palm facing up toward the ceiling. ? If told by your health care provider, bend your left / right elbow to a 90-degree angle (right angle) at your side. 2. With your other hand, gently pull your left / right hand and fingers toward the floor (extension). Stop when you feel a gentle stretch on the palm side of your forearm. 3. Hold this position for __________ seconds. Repeat __________ times. Complete this exercise __________ times a day. Wrist flexion  1. Straighten your left / right elbow in front of you with your palm facing down toward the floor. ? If told by your health care provider, bend  your left / right elbow to a 90-degree angle (right angle) at your side. 2. With your other hand, gently push over the back of your left / right hand so your fingers point toward the floor (flexion). Stop when you feel a gentle stretch on the back of your forearm. 3. Hold this position for __________ seconds. Repeat __________ times. Complete this exercise __________ times a day. Forearm rotation, supination 1. Sit or stand with your elbows at your side. 2. Bend your left / right elbow to a 90-degree angle (right angle). 3. Using your uninjured hand, turn your left / right palm up toward the ceiling (supination) until you feel a gentle stretch along the inside of your forearm. 4. Hold this position for __________ seconds. Repeat __________ times. Complete this exercise __________ times a day. Forearm rotation,  pronation 1. Sit or stand with your elbows at your side. 2. Bend your left / right elbow to a 90-degree angle (right angle). 3. Using your uninjured hand, turn your left / right palm down toward the floor (pronation) until you feel a gentle stretch along the top of your forearm. 4. Hold this position for __________ seconds. Repeat __________ times. Complete this exercise __________ times a day. Strengthening exercises These exercises build strength and endurance in your elbow. Endurance is the ability to use your muscles for a long time, even after they get tired. Wrist flexion  1. Sit with your left / right forearm supported on a table or other surface and your palm turned up toward the ceiling. Let your left / right wrist extend over the edge of the surface. 2. Hold a __________ weight or a piece of rubber exercise band or tubing. ? If using a rubber exercise band or tubing, hold the other end of the tubing with your other hand. 3. Slowly bend your wrist so your hand moves up toward the ceiling (flexion). Try to only move your wrist and keep the rest of your arm still. 4. Hold this position for __________ seconds. 5. Slowly return to the starting position. Repeat __________ times. Complete this exercise __________ times a day. Wrist flexion, eccentric 1. Sit with your left / right forearm palm-up and supported on a table or other surface. Let your left / right wrist extend over the edge of the surface. 2. Hold a __________ weight or a piece of rubber exercise band or tubing in your left / right hand. ? If using a rubber exercise band or tubing, hold the other end of the tubing with your other hand. 3. Use your uninjured hand to move your left / right hand up toward the ceiling. 4. Take your uninjured hand away and slowly return to the starting position using only your left / right hand (eccentric flexion). Repeat __________ times. Complete this exercise __________ times a day. Forearm  rotation, pronation To do this exercise, you will need a lightweight hammer or rubber mallet. 1. Sit with your left / right forearm supported on a table or other surface. Bend your elbow to a 90-degree angle (right angle). Position your forearm so that your palm is facing up toward the ceiling, with your hand resting over the edge of the table. 2. Hold a hammer in your left / right hand. ? To make this exercise easier, hold the hammer near the head of the hammer. ? To make this exercise harder, hold the hammer near the end of the handle. 3. Without moving your elbow, slowly turn (rotate) your forearm so  your palm faces down toward the floor (pronation). 4. Hold this position for __________ seconds. 5. Slowly return to the starting position. Repeat __________ times. Complete this exercise __________ times a day. Shoulder blade squeeze 1. Sit in a stable chair or stand with good posture. If you are sitting down, do not let your back touch the back of the chair. 2. Your arms should be at your sides with your elbows bent to a 90-degree angle (right angle). Position your forearms so that your thumbs are facing the ceiling (neutral position). 3. Without lifting your shoulders up, squeeze your shoulder blades tightly together. 4. Hold this position for __________ seconds. 5. Slowly release and return to the starting position. Repeat __________ times. Complete this exercise __________ times a day. This information is not intended to replace advice given to you by your health care provider. Make sure you discuss any questions you have with your health care provider. Document Revised: 03/28/2019 Document Reviewed: 01/29/2019 Elsevier Patient Education  [place] Band Syndrome Rehab Ask your health care provider which exercises are safe for you. Do exercises exactly as told by your health care provider and adjust them as directed. It is normal to feel mild stretching, pulling,  tightness, or discomfort as you do these exercises. Stop right away if you feel sudden pain or your pain gets significantly worse. Do not begin these exercises until told by your health care provider. Stretching and range-of-motion exercises These exercises warm up your muscles and joints and improve the movement and flexibility of your hip and pelvis. Quadriceps stretch, prone  4. Lie on your abdomen on a firm surface, such as a bed or padded floor (prone position). 5. Bend your left / right knee and reach back to hold your ankle or pant leg. If you cannot reach your ankle or pant leg, loop a belt around your foot and grab the belt instead. 6. Gently pull your heel toward your buttocks. Your knee should not slide out to the side. You should feel a stretch in the front of your thigh and knee (quadriceps). 7. Hold this position for __________ seconds. Repeat __________ times. Complete this exercise __________ times a day. Iliotibial band stretch An iliotibial band is a strong band of muscle tissue that runs from the outer side of your hip to the outer side of your thigh and knee. 4. Lie on your side with your left / right leg in the top position. 5. Bend both of your knees and grab your left / right ankle. Stretch out your bottom arm to help you balance. 6. Slowly bring your top knee back so your thigh goes behind your trunk. 7. Slowly lower your top leg toward the floor until you feel a gentle stretch on the outside of your left / right hip and thigh. If you do not feel a stretch and your knee will not fall farther, place the heel of your other foot on top of your knee and pull your knee down toward the floor with your foot. 8. Hold this position for __________ seconds. Repeat __________ times. Complete this exercise __________ times a day. Strengthening exercises These exercises build strength and endurance in your hip and pelvis. Endurance is the ability to use your muscles for a long time, even  after they get tired. Straight leg raises, side-lying This exercise strengthens the muscles that rotate the leg at the hip and move it away from your body (hip abductors). 5. Lie on your side with  your left / right leg in the top position. Lie so your head, shoulder, hip, and knee line up. You may bend your bottom knee to help you balance. 6. Roll your hips slightly forward so your hips are stacked directly over each other and your left / right knee is facing forward. 7. Tense the muscles in your outer thigh and lift your top leg 4-6 inches (10-15 cm). 8. Hold this position for __________ seconds. 9. Slowly return to the starting position. Let your muscles relax completely before doing another repetition. Repeat __________ times. Complete this exercise __________ times a day. Leg raises, prone This exercise strengthens the muscles that move the hips (hip extensors). 5. Lie on your abdomen on your bed or a firm surface. You can put a pillow under your hips if that is more comfortable for your lower back. 6. Bend your left / right knee so your foot is straight up in the air. 7. Squeeze your buttocks muscles and lift your left / right thigh off the bed. Do not let your back arch. 8. Tense your thigh muscle as hard as you can without increasing any knee pain. 9. Hold this position for __________ seconds. 10. Slowly lower your leg to the starting position and allow it to relax completely. Repeat __________ times. Complete this exercise __________ times a day. Hip hike 6. Stand sideways on a bottom step. Stand on your left / right leg with your other foot unsupported next to the step. You can hold on to the railing or wall for balance if needed. 7. Keep your knees straight and your torso square. Then lift your left / right hip up toward the ceiling. 8. Slowly let your left / right hip lower toward the floor, past the starting position. Your foot should get closer to the floor. Do not lean or bend your  knees. Repeat __________ times. Complete this exercise __________ times a day. This information is not intended to replace advice given to you by your health care provider. Make sure you discuss any questions you have with your health care provider. Document Revised: 03/28/2019 Document Reviewed: 09/26/2018 Elsevier Patient Education  2020 [place]. Hip Bursitis Rehab Ask your health care provider which exercises are safe for you. Do exercises exactly as told by your health care provider and adjust them as directed. It is normal to feel mild stretching, pulling, tightness, or discomfort as you do these exercises. Stop right away if you feel sudden pain or your pain gets worse. Do not begin these exercises until told by your health care provider. Stretching exercise This exercise warms up your muscles and joints and improves the movement and flexibility of your hip. This exercise also helps to relieve pain and stiffness. Iliotibial band stretch An iliotibial band is a strong band of muscle tissue that runs from the outer side of your hip to the outer side of your thigh and knee. 1. Lie on your side with your left / right leg in the top position. 2. Bend your left / right knee and grab your ankle. Stretch out your bottom arm to help you balance. 3. Slowly bring your knee back so your thigh is behind your body. 4. Slowly lower your knee toward the floor until you feel a gentle stretch on the outside of your left / right thigh. If you do not feel a stretch and your knee will not fall farther, place the heel of your other foot on top of your knee and pull  your knee down toward the floor with your foot. 5. Hold this position for __________ seconds. 6. Slowly return to the starting position. Repeat __________ times. Complete this exercise __________ times a day. Strengthening exercises These exercises build strength and endurance in your hip and pelvis. Endurance is the ability to use your muscles  for a long time, even after they get tired. Bridge This exercise strengthens the muscles that move your thigh backward (hip extensors). 1. Lie on your back on a firm surface with your knees bent and your feet flat on the floor. 2. Tighten your buttocks muscles and lift your buttocks off the floor until your trunk is level with your thighs. ? Do not arch your back. ? You should feel the muscles working in your buttocks and the back of your thighs. If you do not feel these muscles, slide your feet 1-2 inches (2.5-5 cm) farther away from your buttocks. ? If this exercise is too easy, try doing it with your arms crossed over your chest. 3. Hold this position for __________ seconds. 4. Slowly lower your hips to the starting position. 5. Let your muscles relax completely after each repetition. Repeat __________ times. Complete this exercise __________ times a day. Squats This exercise strengthens the muscles in front of your thigh and knee (quadriceps). 1. Stand in front of a table, with your feet and knees pointing straight ahead. You may rest your hands on the table for balance but not for support. 2. Slowly bend your knees and lower your hips like you are going to sit in a chair. ? Keep your weight over your heels, not over your toes. ? Keep your lower legs upright so they are parallel with the table legs. ? Do not let your hips go lower than your knees. ? Do not bend lower than told by your health care provider. ? If your hip pain increases, do not bend as low. 3. Hold the squat position for __________ seconds. 4. Slowly push with your legs to return to standing. Do not use your hands to pull yourself to standing. Repeat __________ times. Complete this exercise __________ times a day. Hip hike 1. Stand sideways on a bottom step. Stand on your left / right leg with your other foot unsupported next to the step. You can hold on to the railing or wall for balance if needed. 2. Keep your knees  straight and your torso square. Then lift your left / right hip up toward the ceiling. 3. Hold this position for __________ seconds. 4. Slowly let your left / right hip lower toward the floor, past the starting position. Your foot should get closer to the floor. Do not lean or bend your knees. Repeat __________ times. Complete this exercise __________ times a day. Single leg stand 1. Without shoes, stand near a railing or in a doorway. You may hold on to the railing or door frame as needed for balance. 2. Squeeze your left / right buttock muscles, then lift up your other foot. ? Do not let your left / right hip push out to the side. ? It is helpful to stand in front of a mirror for this exercise so you can watch your hip. 3. Hold this position for __________ seconds. Repeat __________ times. Complete this exercise __________ times a day. This information is not intended to replace advice given to you by your health care provider. Make sure you discuss any questions you have with your health care provider. Document Revised: 04/01/2019  Document Reviewed: 04/01/2019 Elsevier Patient Education  [employer].

## 2020-09-02 DIAGNOSIS — Z8616 Personal history of COVID-19: Secondary | ICD-10-CM | POA: Diagnosis not present

## 2020-09-02 DIAGNOSIS — M810 Age-related osteoporosis without current pathological fracture: Secondary | ICD-10-CM | POA: Diagnosis not present

## 2020-09-02 DIAGNOSIS — I2699 Other pulmonary embolism without acute cor pulmonale: Secondary | ICD-10-CM | POA: Diagnosis not present

## 2020-09-02 DIAGNOSIS — R590 Localized enlarged lymph nodes: Secondary | ICD-10-CM | POA: Diagnosis not present

## 2020-09-02 DIAGNOSIS — J189 Pneumonia, unspecified organism: Secondary | ICD-10-CM | POA: Diagnosis not present

## 2020-09-08 DIAGNOSIS — M5030 Other cervical disc degeneration, unspecified cervical region: Secondary | ICD-10-CM | POA: Diagnosis not present

## 2020-09-08 DIAGNOSIS — F411 Generalized anxiety disorder: Secondary | ICD-10-CM | POA: Diagnosis not present

## 2020-09-08 DIAGNOSIS — K219 Gastro-esophageal reflux disease without esophagitis: Secondary | ICD-10-CM | POA: Diagnosis not present

## 2020-09-08 DIAGNOSIS — M5136 Other intervertebral disc degeneration, lumbar region: Secondary | ICD-10-CM | POA: Diagnosis not present

## 2020-09-08 DIAGNOSIS — I2699 Other pulmonary embolism without acute cor pulmonale: Secondary | ICD-10-CM | POA: Diagnosis not present

## 2020-09-08 DIAGNOSIS — H8113 Benign paroxysmal vertigo, bilateral: Secondary | ICD-10-CM | POA: Diagnosis not present

## 2020-09-08 DIAGNOSIS — Z8616 Personal history of COVID-19: Secondary | ICD-10-CM | POA: Diagnosis not present

## 2020-09-08 DIAGNOSIS — E538 Deficiency of other specified B group vitamins: Secondary | ICD-10-CM | POA: Diagnosis not present

## 2020-09-08 DIAGNOSIS — L509 Urticaria, unspecified: Secondary | ICD-10-CM | POA: Diagnosis not present

## 2020-09-08 NOTE — Progress Notes (Deleted)
Office Visit Note  Patient: Crystal Dillon             Date of Birth: [birth year]           MRN: [id number]             PCP: Marijo File, MD Referring: Marijo File, MD Visit Date: 09/21/2020 Occupation: @GUAROCC @  Subjective:  No chief complaint on file.   History of Present Illness: Crystal Dillon is a 54 y.o. female ***   Activities of Daily Living:  Patient reports morning stiffness for *** {minute/hour:19697}.   Patient {ACTIONS;DENIES/REPORTS:21021675::"Denies"} nocturnal pain.  Difficulty dressing/grooming: {ACTIONS;DENIES/REPORTS:21021675::"Denies"} Difficulty climbing stairs: {ACTIONS;DENIES/REPORTS:21021675::"Denies"} Difficulty getting out of chair: {ACTIONS;DENIES/REPORTS:21021675::"Denies"} Difficulty using hands for taps, buttons, cutlery, and/or writing: {ACTIONS;DENIES/REPORTS:21021675::"Denies"}  No Rheumatology ROS completed.   PMFS History:  Patient Active Problem List   Diagnosis Date Noted  . Pulmonary embolism ([place]) 02/09/2020  . Primary osteoarthritis of both knees 12/14/2018  . Hx of migraines 11/23/2018  . Raynaud's syndrome without gangrene 11/23/2018  . Chronic urticaria 11/23/2018  . Family history of inflammatory bowel disease 11/23/2018  . Upper respiratory infection 02/28/2017  . Fibroids 09/20/2016  . Anxiety state 09/20/2016  . DDD (degenerative disc disease), lumbar 09/20/2016  . Migraine 09/20/2016  . Left hip pain 09/20/2016  . Chronic pain of both knees 09/20/2016  . Hyperglycemia 09/20/2016    Past Medical History:  Diagnosis Date  . Anemia   . Anxiety   . Chronic urticaria 11/23/2018  . Complication of anesthesia    Blood pressure drops low  . COVID-19 virus infection 01/2020  . Environmental and seasonal allergies   . GERD (gastroesophageal reflux disease)   . Migraine   . OA (osteoarthritis)   . Osteoporosis    Dr. Julien Girt  . PONV (postoperative nausea and vomiting)   . RAYNAUDS SYNDROME 01/06/2011    Qualifier: Diagnosis of  By: Arnoldo Morale MD, Balinda Quails   . Uterine fibroid    diagnosed by OB/GYN Dr Mayra Neer  . Varicose veins of left lower extremity with both ulcer of ankle and inflammation ([place]) 07/07/2011   Referral to interventional radiology for vascular study and potential intervention   . Vertigo     Family History  Problem Relation Age of Onset  . COPD Mother   . Hyperlipidemia Mother   . Diabetes Mother   . Cancer Father        lung, smoker  . Diabetes Father   . Stroke Father   . Colon cancer Maternal Grandmother   . Diabetes Maternal Grandmother   . Healthy Sister   . Heart Problems Brother   . Crohn's disease Son   . Ulcerative colitis Daughter    Past Surgical History:  Procedure Laterality Date  . ADENOIDECTOMY    . CESAREAN SECTION    . DENTAL SURGERY    . HYSTERECTOMY ABDOMINAL WITH SALPINGO-OOPHORECTOMY Bilateral 06/13/2019   Procedure: HYSTERECTOMY ABDOMINAL WITH SALPINGO-OOPHORECTOMY;  Surgeon: Marylynn Pearson, MD;  Location: [facility];  Service: Gynecology;  Laterality: Bilateral;  . HYSTEROSCOPY    . TONSILLECTOMY    . TUBAL LIGATION     Social History   Social History Narrative   Work or School: acounting       Home Situation: lives with husband and son 48 yo, and niece whom is 41 yo      Spiritual Beliefs: Christian      Lifestyle:trying to eat healthy; no regular exercise   Immunization History  Administered Date(s) Administered  .  Influenza,inj,Quad PF,6+ Mos 09/20/2016, 10/03/2017  . Moderna SARS-COVID-2 Vaccination 04/02/2020, 05/02/2020  . Td 09/20/2005, 10/03/2017     Objective: Vital Signs: LMP 07/06/2011    Physical Exam   Musculoskeletal Exam: ***  CDAI Exam: CDAI Score: -- Patient Global: --; Provider Global: -- Swollen: --; Tender: -- Joint Exam 09/21/2020   No joint exam has been documented for this visit   There is currently no information documented on the homunculus. Go to the Rheumatology  activity and complete the homunculus joint exam.  Investigation: No additional findings.  Imaging: No results found.  Recent Labs: Lab Results  Component Value Date   WBC 7.3 02/10/2020   HGB 11.0 (L) 02/10/2020   PLT 285 02/10/2020   NA 133 (L) 02/10/2020   K 4.1 02/10/2020   CL 97 (L) 02/10/2020   CO2 25 02/10/2020   GLUCOSE 104 (H) 02/10/2020   BUN 7 02/10/2020   CREATININE 0.74 02/10/2020   BILITOT 0.7 02/10/2020   ALKPHOS 222 (H) 02/10/2020   AST 90 (H) 02/10/2020   ALT 144 (H) 02/10/2020   PROT 6.8 02/10/2020   ALBUMIN 3.0 (L) 02/10/2020   CALCIUM 9.0 02/10/2020   GFRAA >60 02/10/2020    Speciality Comments: No specialty comments available.  Procedures:  No procedures performed Allergies: Patient has no known allergies.   Assessment / Plan:     Visit Diagnoses: No diagnosis found.  Orders: No orders of the defined types were placed in this encounter.  No orders of the defined types were placed in this encounter.   Face-to-face time spent with patient was *** minutes. Greater than 50% of time was spent in counseling and coordination of care.  Follow-Up Instructions: No follow-ups on file.   Ofilia Neas, PA-C  Note - This record has been created using Dragon software.  Chart creation errors have been sought, but may not always  have been located. Such creation errors do not reflect on  the standard of medical care.

## 2020-09-14 ENCOUNTER — Other Ambulatory Visit: Payer: Self-pay

## 2020-09-14 ENCOUNTER — Ambulatory Visit: Payer: Self-pay

## 2020-09-14 ENCOUNTER — Ambulatory Visit (INDEPENDENT_AMBULATORY_CARE_PROVIDER_SITE_OTHER): Payer: BC Managed Care – PPO | Admitting: Rheumatology

## 2020-09-14 DIAGNOSIS — M19041 Primary osteoarthritis, right hand: Secondary | ICD-10-CM | POA: Diagnosis not present

## 2020-09-14 DIAGNOSIS — M19042 Primary osteoarthritis, left hand: Secondary | ICD-10-CM

## 2020-09-16 ENCOUNTER — Other Ambulatory Visit: Payer: BC Managed Care – PPO | Admitting: Rheumatology

## 2020-09-17 ENCOUNTER — Encounter: Payer: Self-pay | Admitting: Rheumatology

## 2020-09-17 DIAGNOSIS — R768 Other specified abnormal immunological findings in serum: Secondary | ICD-10-CM | POA: Diagnosis not present

## 2020-09-21 ENCOUNTER — Ambulatory Visit: Payer: Self-pay | Admitting: Rheumatology

## 2020-09-21 ENCOUNTER — Ambulatory Visit: Payer: BC Managed Care – PPO | Admitting: Rheumatology

## 2020-09-28 ENCOUNTER — Ambulatory Visit: Payer: BC Managed Care – PPO | Admitting: Rheumatology

## 2020-10-01 ENCOUNTER — Encounter: Payer: Self-pay | Admitting: Physician Assistant

## 2020-10-01 ENCOUNTER — Other Ambulatory Visit: Payer: Self-pay

## 2020-10-01 ENCOUNTER — Ambulatory Visit (INDEPENDENT_AMBULATORY_CARE_PROVIDER_SITE_OTHER): Payer: BC Managed Care – PPO | Admitting: Physician Assistant

## 2020-10-01 VITALS — BP 118/83 | HR 66 | Resp 13 | Ht 61.0 in | Wt 117.2 lb

## 2020-10-01 DIAGNOSIS — Z8669 Personal history of other diseases of the nervous system and sense organs: Secondary | ICD-10-CM

## 2020-10-01 DIAGNOSIS — M7061 Trochanteric bursitis, right hip: Secondary | ICD-10-CM

## 2020-10-01 DIAGNOSIS — M7062 Trochanteric bursitis, left hip: Secondary | ICD-10-CM

## 2020-10-01 DIAGNOSIS — Z8379 Family history of other diseases of the digestive system: Secondary | ICD-10-CM

## 2020-10-01 DIAGNOSIS — Z86711 Personal history of pulmonary embolism: Secondary | ICD-10-CM

## 2020-10-01 DIAGNOSIS — M19071 Primary osteoarthritis, right ankle and foot: Secondary | ICD-10-CM

## 2020-10-01 DIAGNOSIS — I7300 Raynaud's syndrome without gangrene: Secondary | ICD-10-CM

## 2020-10-01 DIAGNOSIS — M19041 Primary osteoarthritis, right hand: Secondary | ICD-10-CM | POA: Diagnosis not present

## 2020-10-01 DIAGNOSIS — M19042 Primary osteoarthritis, left hand: Secondary | ICD-10-CM

## 2020-10-01 DIAGNOSIS — R768 Other specified abnormal immunological findings in serum: Secondary | ICD-10-CM | POA: Diagnosis not present

## 2020-10-01 DIAGNOSIS — L501 Idiopathic urticaria: Secondary | ICD-10-CM | POA: Diagnosis not present

## 2020-10-01 DIAGNOSIS — F411 Generalized anxiety disorder: Secondary | ICD-10-CM

## 2020-10-01 DIAGNOSIS — M7702 Medial epicondylitis, left elbow: Secondary | ICD-10-CM

## 2020-10-01 DIAGNOSIS — R748 Abnormal levels of other serum enzymes: Secondary | ICD-10-CM

## 2020-10-01 DIAGNOSIS — M7701 Medial epicondylitis, right elbow: Secondary | ICD-10-CM

## 2020-10-01 DIAGNOSIS — M170 Bilateral primary osteoarthritis of knee: Secondary | ICD-10-CM

## 2020-10-01 DIAGNOSIS — M5136 Other intervertebral disc degeneration, lumbar region: Secondary | ICD-10-CM

## 2020-10-01 DIAGNOSIS — M722 Plantar fascial fibromatosis: Secondary | ICD-10-CM

## 2020-10-01 NOTE — Progress Notes (Signed)
Office Visit Note  Patient: Crystal Dillon             Date of Birth: 1966-04-14           MRN: [id number]             PCP: Marijo File, MD Referring: Marijo File, MD Visit Date: 10/01/2020 Occupation: _0 @  Subjective:  Discuss AVISE lab results   History of Present Illness: Crystal Dillon is a 54 y.o. female with history of positive ANA, osteoarthritis, and Raynaud's.  She presents today to discuss AIVSE results. She states she discontinued eliquis about 2-3 weeks ago and has noticed her joint pain and stiffness have improved significantly.  She continues to have intermittent symptoms of Raynaud's but denies any digital ulcerations.  She has mild sicca symptoms which have been tolerable.  She denies any oral or nasal ulcerations. She has not had any recent rashes, photosensitivity, or increased hair loss.  She denies any chest pain, palpitations, or shortness of breath.  Her energy level has been improving gradually. She states overall she continues to feel better everyday since having covid in the spring.     Activities of Daily Living:  Patient reports morning stiffness for 30  minutes.   Patient Reports nocturnal pain.  Difficulty dressing/grooming: Denies Difficulty climbing stairs: Denies Difficulty getting out of chair: Denies Difficulty using hands for taps, buttons, cutlery, and/or writing: Denies  Review of Systems  Constitutional: Positive for fatigue.  HENT: Negative for mouth sores, mouth dryness and nose dryness.   Eyes: Positive for itching and dryness. Negative for pain.  Respiratory: Negative for shortness of breath and difficulty breathing.   Cardiovascular: Negative for chest pain and palpitations.  Gastrointestinal: Negative for blood in stool, constipation and diarrhea.  Endocrine: Negative for increased urination.  Genitourinary: Negative for difficulty urinating and painful urination.  Musculoskeletal: Positive for arthralgias, joint pain  and morning stiffness. Negative for joint swelling, myalgias, muscle tenderness and myalgias.  Skin: Positive for rash. Negative for color change.  Allergic/Immunologic: Negative for susceptible to infections.  Neurological: Positive for dizziness, numbness and headaches. Negative for memory loss and weakness.  Hematological: Positive for bruising/bleeding tendency.  Psychiatric/Behavioral: Positive for sleep disturbance. Negative for confusion.    PMFS History:  Patient Active Problem List   Diagnosis Date Noted   Pulmonary embolism ([place]) 02/09/2020   Primary osteoarthritis of both knees 12/14/2018   Hx of migraines 11/23/2018   Raynaud's syndrome without gangrene 11/23/2018   Chronic urticaria 11/23/2018   Family history of inflammatory bowel disease 11/23/2018   Upper respiratory infection 02/28/2017   Fibroids 09/20/2016   Anxiety state 09/20/2016   DDD (degenerative disc disease), lumbar 09/20/2016   Migraine 09/20/2016   Left hip pain 09/20/2016   Chronic pain of both knees 09/20/2016   Hyperglycemia 09/20/2016    Past Medical History:  Diagnosis Date   Anemia    Anxiety    Chronic urticaria 07/24/7618   Complication of anesthesia    Blood pressure drops low   COVID-19 virus infection 01/2020   Environmental and seasonal allergies    GERD (gastroesophageal reflux disease)    Migraine    OA (osteoarthritis)    Osteoporosis    Dr. Julien Girt   PONV (postoperative nausea and vomiting)    RAYNAUDS SYNDROME 01/06/2011   Qualifier: Diagnosis of  By: Arnoldo Morale MD, Balinda Quails    Uterine fibroid    diagnosed by OB/GYN Dr Mayra Neer   Varicose veins of left lower  extremity with both ulcer of ankle and inflammation ([place]) 07/07/2011   Referral to interventional radiology for vascular study and potential intervention    Vertigo     Family History  Problem Relation Age of Onset   COPD Mother    Hyperlipidemia Mother    Diabetes Mother    Cancer  Father        lung, smoker   Diabetes Father    Stroke Father    Colon cancer Maternal Grandmother    Diabetes Maternal Grandmother    Healthy Sister    Heart Problems Brother    Crohn's disease Son    Ulcerative colitis Daughter    Past Surgical History:  Procedure Laterality Date   ADENOIDECTOMY     CESAREAN SECTION     DENTAL SURGERY     HYSTERECTOMY ABDOMINAL WITH SALPINGO-OOPHORECTOMY Bilateral 06/13/2019   Procedure: HYSTERECTOMY ABDOMINAL WITH SALPINGO-OOPHORECTOMY;  Surgeon: Marylynn Pearson, MD;  Location: [facility];  Service: Gynecology;  Laterality: Bilateral;   HYSTEROSCOPY     TONSILLECTOMY     TUBAL LIGATION     Social History   Social History Narrative   Work or School: [employer]ing       Home Situation: lives with husband and son 5 yo, and niece whom is 59 yo      Spiritual Beliefs: Christian      Lifestyle:trying to eat healthy; no regular exercise   Immunization History  Administered Date(s) Administered   Influenza,inj,Quad PF,6+ Mos 09/20/2016, 10/03/2017   Moderna SARS-COVID-2 Vaccination 04/02/2020, 05/02/2020   Td 09/20/2005, 10/03/2017     Objective: Vital Signs: BP 118/83 (BP Location: Left Arm, Patient Position: Sitting, Cuff Size: Normal)    Pulse 66    Resp 13    Ht _0  (1.549 m)    Wt 117 lb 3.2 oz (53.2 kg)    LMP 07/06/2011    BMI 22.14 kg/m    Physical Exam Vitals and nursing note reviewed.  Constitutional:      Appearance: She is well-developed.  HENT:     Head: Normocephalic and atraumatic.  Eyes:     Conjunctiva/sclera: Conjunctivae normal.  Pulmonary:     Effort: Pulmonary effort is normal.  Abdominal:     Palpations: Abdomen is soft.  Musculoskeletal:     Cervical back: Normal range of motion.  Skin:    General: Skin is warm and dry.     Capillary Refill: Capillary refill takes less than 2 seconds.  Neurological:     Mental Status: She is alert and oriented to person, place, and time.    Psychiatric:        Behavior: Behavior normal.      Musculoskeletal Exam: C-spine, thoracic spine, and lumbar spine good ROM.  Shoulder joints, elbow joints, wrist joints, MCPs, PIPs, DIPs good ROM with no synovitis.  Complete fist formation bilaterally.  Hip joints, knee joints, and ankle joints god ROM with no discomfort.  No warmth or effusion of knee joints.  No tenderness or swelling of ankle joints.   CDAI Exam: CDAI Score: -- Patient Global: --; Provider Global: -- Swollen: --; Tender: -- Joint Exam 10/01/2020   No joint exam has been documented for this visit   There is currently no information documented on the homunculus. Go to the Rheumatology activity and complete the homunculus joint exam.  Investigation: No additional findings.  Imaging: [place] Extrem Up Bilat Comp  Result Date: 09/14/2020 Ultrasound examination of bilateral hands was performed per EULAR recommendations.  Using 12 MHz transducer, grayscale and power Doppler bilateral second, third, and fifth MCP joints and bilateral wrist joints both dorsal and volar aspects were evaluated to look for synovitis or tenosynovitis. The findings were there was no synovitis or tenosynovitis on ultrasound examination. Right median nerve was 0.05 cm squares which was normal limits and left median nerve was 0.09 cm squares which was within normal limits. Impression: Ultrasound examination did not show any synovitis or tenosynovitis.   Recent Labs: Lab Results  Component Value Date   WBC 7.3 02/10/2020   HGB 11.0 (L) 02/10/2020   PLT 285 02/10/2020   NA 133 (L) 02/10/2020   K 4.1 02/10/2020   CL 97 (L) 02/10/2020   CO2 25 02/10/2020   GLUCOSE 104 (H) 02/10/2020   BUN 7 02/10/2020   CREATININE 0.74 02/10/2020   BILITOT 0.7 02/10/2020   ALKPHOS 222 (H) 02/10/2020   AST 90 (H) 02/10/2020   ALT 144 (H) 02/10/2020   PROT 6.8 02/10/2020   ALBUMIN 3.0 (L) 02/10/2020   CALCIUM 9.0 02/10/2020   GFRAA >60 02/10/2020     Speciality Comments: No specialty comments available.  Procedures:  No procedures performed Allergies: Patient has no known allergies.   Assessment / Plan:     Visit Diagnoses: Positive ANA (antinuclear antibody) - AVISE 11/23/18: Negative index: -1.9.  ANA negative. AVISE on 09/17/20: negative index-0.8 ANA+, no titer: AVISE lab results were discussed in detail and all questions were addressed today.  Overall she has started to feel significantly better since her last office visit.  She discontinued Eliquis 2 to 3 weeks ago and her joint pain and stiffness have improved significantly.  She had an ultrasound of both hands performed on 09/14/2020 which was negative for synovitis.  She has no joint tenderness or synovitis on examination today.  Her level of fatigue has been improving.  She has not had any recent rashes or recurrence of your urticaria.  She continues to have intermittent symptoms of Raynaud's but no digital ulcerations or signs of gangrene were noted on exam.  She has not had any recent oral or nasal ulcerations.  Her sicca symptoms have been tolerable.  She has no other clinical features of autoimmune disease at this time.  She will not require immunosuppressive therapy at this time.  She was advised to notify us if she develops any new or worsening symptoms.  She will follow-up in the office in 1 year  Chronic idiopathic urticaria - Followed by Dr. Nelva Bush. Current regimen: Xolair, Allegra, Xyzal, Pepcid, and Singulair.    Raynaud's disease without gangrene: She continues to have intermittent symptoms of raynaud's.  No digital ulcerations or signs of gangrene.  No skin tightness or thickening noted.  She was advised to notify us if she develops any new or worsening symptoms.  Primary osteoarthritis of both hands: She has PIP and DIP thickening consistent with osteoarthritis of both hands.  Her joint pain and stiffness have improved over the past 2 to 3 weeks since discontinuing  Eliquis.  She had an ultrasound performed on both hands on 09/14/2020 which revealed no active synovitis. she has no tenderness or inflammation on exam.  She has complete fist formation bilaterally.  Joint protection and muscle strengthening were discussed.  She was advised to notify us if her joint pain and inflammation returns.  Primary osteoarthritis of both knees: She has good ROM of both knee joints.  No warmth or effusion of knee joints.  Primary osteoarthritis of right foot:  She is not having any discomfort in her right foot at this time.  Plantar fasciitis: Resolved.   DDD (degenerative disc disease), lumbar: She is not having any discomfort in her lower back at this time. No symptoms of radiculopathy.   Elevated alkaline phosphatase level - Alk phos 222 on 02/10/20.  History of pulmonary embolism - Dx with Covid-19 in February 2021-hospitalized with pneumonia + PE.  She discontinued eliquis 2-3 weeks ago.   Medial epicondylitis of both elbows: She has ongoing tenderness to palpation on exam. She was given a handout of exercises to perform. She was advised to notify us if her symptoms persist or worsen.   Trochanteric bursitis of both hips: She has mild tenderness to palpation on exam.  Other medical conditions are listed as follows:   Anxiety state  Hx of migraines  Family history of inflammatory bowel disease  Orders: No orders of the defined types were placed in this encounter.  No orders of the defined types were placed in this encounter.    Follow-Up Instructions: Return in about 1 year (around 10/01/2021) for +ANA, Osteoarthritis.   Ofilia Neas, PA-C  Note - This record has been created using Dragon software.  Chart creation errors have been sought, but may not always  have been located. Such creation errors do not reflect on  the standard of medical care.

## 2020-10-01 NOTE — Patient Instructions (Signed)
[facility] Ask your health care provider which exercises are safe for you. Do exercises exactly as told by your health care provider and adjust them as directed. It is normal to feel mild stretching, pulling, tightness, or discomfort as you do these exercises. Stop right away if you feel sudden pain or your pain gets worse. Do not begin these exercises until told by your health care provider. Stretching and range-of-motion exercises These exercises warm up your muscles and joints and improve the movement and flexibility of your elbow. Wrist extension  1. Straighten your left / right elbow in front of you with your palm facing up toward the ceiling. ? If told by your health care provider, bend your left / right elbow to a 90-degree angle (right angle) at your side. 2. With your other hand, gently pull your left / right hand and fingers toward the floor (extension). Stop when you feel a gentle stretch on the palm side of your forearm. 3. Hold this position for __________ seconds. Repeat __________ times. Complete this exercise __________ times a day. Wrist flexion  1. Straighten your left / right elbow in front of you with your palm facing down toward the floor. ? If told by your health care provider, bend your left / right elbow to a 90-degree angle (right angle) at your side. 2. With your other hand, gently push over the back of your left / right hand so your fingers point toward the floor (flexion). Stop when you feel a gentle stretch on the back of your forearm. 3. Hold this position for __________ seconds. Repeat __________ times. Complete this exercise __________ times a day. Forearm rotation, supination 1. Sit or stand with your elbows at your side. 2. Bend your left / right elbow to a 90-degree angle (right angle). 3. Using your uninjured hand, turn your left / right palm up toward the ceiling (supination) until you feel a gentle stretch along the inside of your forearm. 4. Hold  this position for __________ seconds. Repeat __________ times. Complete this exercise __________ times a day. Forearm rotation, pronation 1. Sit or stand with your elbows at your side. 2. Bend your left / right elbow to a 90-degree angle (right angle). 3. Using your uninjured hand, turn your left / right palm down toward the floor (pronation) until you feel a gentle stretch along the top of your forearm. 4. Hold this position for __________ seconds. Repeat __________ times. Complete this exercise __________ times a day. Strengthening exercises These exercises build strength and endurance in your elbow. Endurance is the ability to use your muscles for a long time, even after they get tired. Wrist flexion  1. Sit with your left / right forearm supported on a table or other surface and your palm turned up toward the ceiling. Let your left / right wrist extend over the edge of the surface. 2. Hold a __________ weight or a piece of rubber exercise band or tubing. ? If using a rubber exercise band or tubing, hold the other end of the tubing with your other hand. 3. Slowly bend your wrist so your hand moves up toward the ceiling (flexion). Try to only move your wrist and keep the rest of your arm still. 4. Hold this position for __________ seconds. 5. Slowly return to the starting position. Repeat __________ times. Complete this exercise __________ times a day. Wrist flexion, eccentric 1. Sit with your left / right forearm palm-up and supported on a table or other surface.   Let your left / right wrist extend over the edge of the surface. 2. Hold a __________ weight or a piece of rubber exercise band or tubing in your left / right hand. ? If using a rubber exercise band or tubing, hold the other end of the tubing with your other hand. 3. Use your uninjured hand to move your left / right hand up toward the ceiling. 4. Take your uninjured hand away and slowly return to the starting position using only  your left / right hand (eccentric flexion). Repeat __________ times. Complete this exercise __________ times a day. Forearm rotation, pronation To do this exercise, you will need a lightweight hammer or rubber mallet. 1. Sit with your left / right forearm supported on a table or other surface. Bend your elbow to a 90-degree angle (right angle). Position your forearm so that your palm is facing up toward the ceiling, with your hand resting over the edge of the table. 2. Hold a hammer in your left / right hand. ? To make this exercise easier, hold the hammer near the head of the hammer. ? To make this exercise harder, hold the hammer near the end of the handle. 3. Without moving your elbow, slowly turn (rotate) your forearm so your palm faces down toward the floor (pronation). 4. Hold this position for __________ seconds. 5. Slowly return to the starting position. Repeat __________ times. Complete this exercise __________ times a day. Shoulder blade squeeze 1. Sit in a stable chair or stand with good posture. If you are sitting down, do not let your back touch the back of the chair. 2. Your arms should be at your sides with your elbows bent to a 90-degree angle (right angle). Position your forearms so that your thumbs are facing the ceiling (neutral position). 3. Without lifting your shoulders up, squeeze your shoulder blades tightly together. 4. Hold this position for __________ seconds. 5. Slowly release and return to the starting position. Repeat __________ times. Complete this exercise __________ times a day. This information is not intended to replace advice given to you by your health care provider. Make sure you discuss any questions you have with your health care provider. Document Revised: 03/28/2019 Document Reviewed: 01/29/2019 Elsevier Patient Education  2020 [employer]  

## 2020-10-26 DIAGNOSIS — Z09 Encounter for follow-up examination after completed treatment for conditions other than malignant neoplasm: Secondary | ICD-10-CM | POA: Diagnosis not present

## 2020-10-26 DIAGNOSIS — I2699 Other pulmonary embolism without acute cor pulmonale: Secondary | ICD-10-CM | POA: Diagnosis not present

## 2020-10-26 DIAGNOSIS — Z86711 Personal history of pulmonary embolism: Secondary | ICD-10-CM | POA: Diagnosis not present

## 2020-10-26 DIAGNOSIS — Z8616 Personal history of COVID-19: Secondary | ICD-10-CM | POA: Diagnosis not present

## 2020-11-03 ENCOUNTER — Other Ambulatory Visit: Payer: Self-pay | Admitting: Family Medicine

## 2020-12-08 DIAGNOSIS — Z0000 Encounter for general adult medical examination without abnormal findings: Secondary | ICD-10-CM | POA: Diagnosis not present

## 2020-12-08 DIAGNOSIS — Z13220 Encounter for screening for lipoid disorders: Secondary | ICD-10-CM | POA: Diagnosis not present

## 2020-12-09 DIAGNOSIS — D72819 Decreased white blood cell count, unspecified: Secondary | ICD-10-CM | POA: Diagnosis not present

## 2020-12-09 DIAGNOSIS — Z20822 Contact with and (suspected) exposure to covid-19: Secondary | ICD-10-CM | POA: Diagnosis not present

## 2020-12-09 DIAGNOSIS — F411 Generalized anxiety disorder: Secondary | ICD-10-CM | POA: Diagnosis not present

## 2020-12-09 DIAGNOSIS — M5030 Other cervical disc degeneration, unspecified cervical region: Secondary | ICD-10-CM | POA: Diagnosis not present

## 2020-12-29 ENCOUNTER — Ambulatory Visit: Payer: Self-pay | Admitting: Rheumatology

## 2021-01-27 ENCOUNTER — Other Ambulatory Visit: Payer: Self-pay | Admitting: Family Medicine

## 2021-03-04 ENCOUNTER — Other Ambulatory Visit: Payer: Self-pay | Admitting: Family Medicine

## 2021-06-17 IMAGING — DX DG CERVICAL SPINE COMPLETE 4+V
5 series · 5 of 5 positions shown · non-contrast
Comparison: None.

CLINICAL DATA: Soft tissue prominence along posterior cervical
region on the right with tenderness.

EXAM:
CERVICAL SPINE - COMPLETE 4+ VIEW

[c-spine lat]
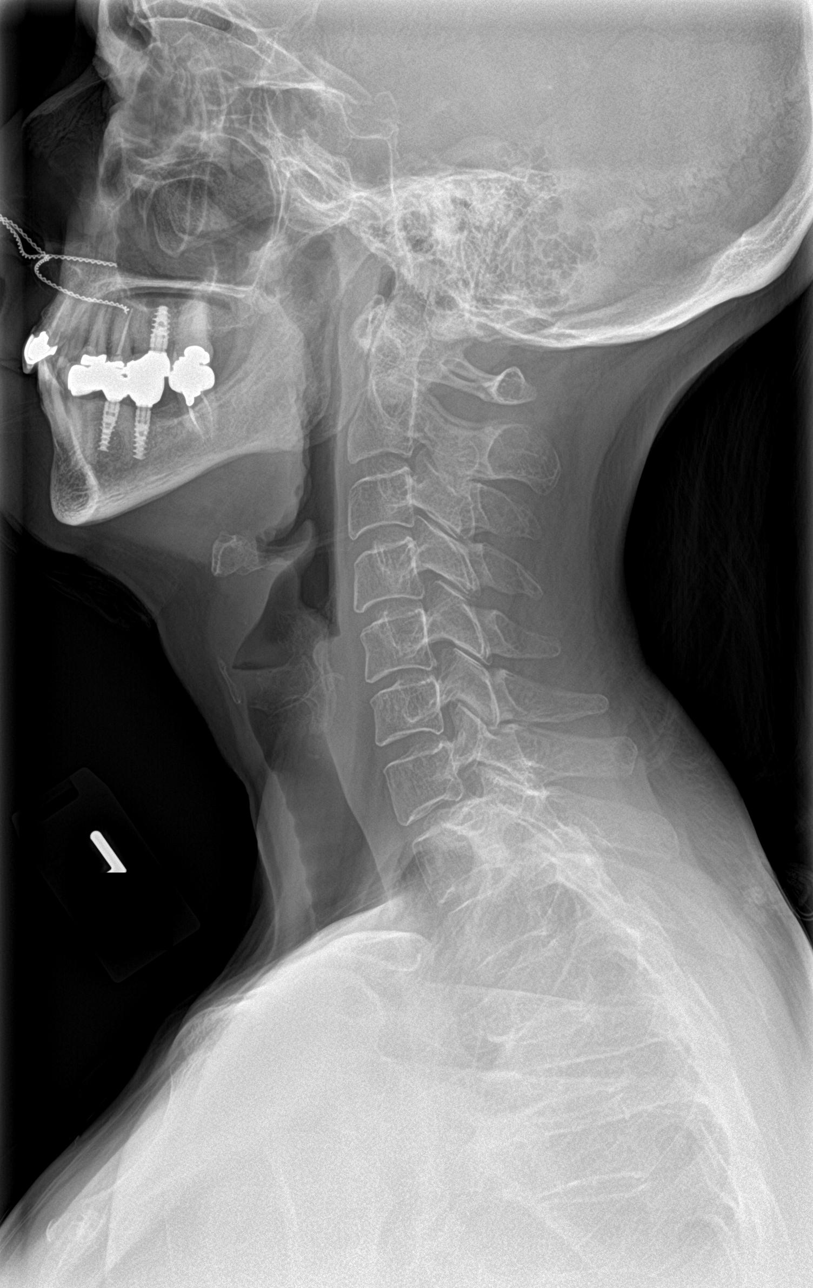

[c-spine obl (1 of 2)]
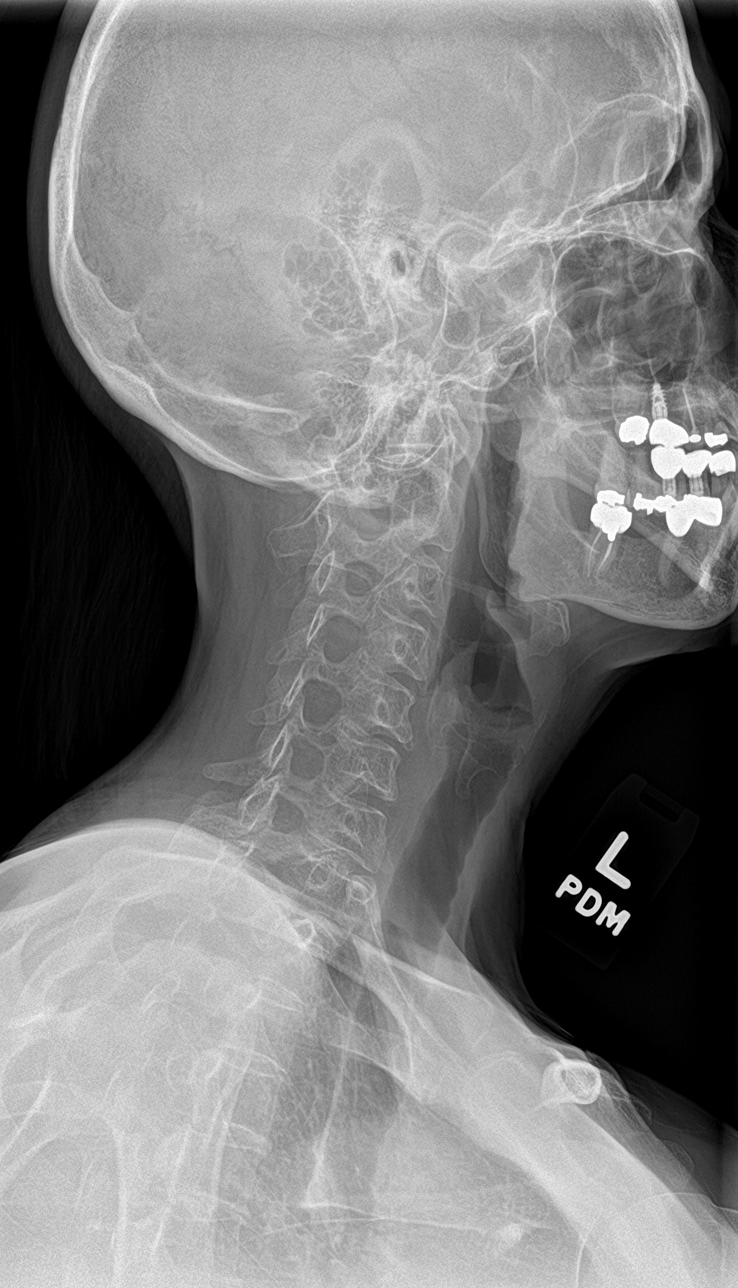

[c-spine obl (2 of 2)]
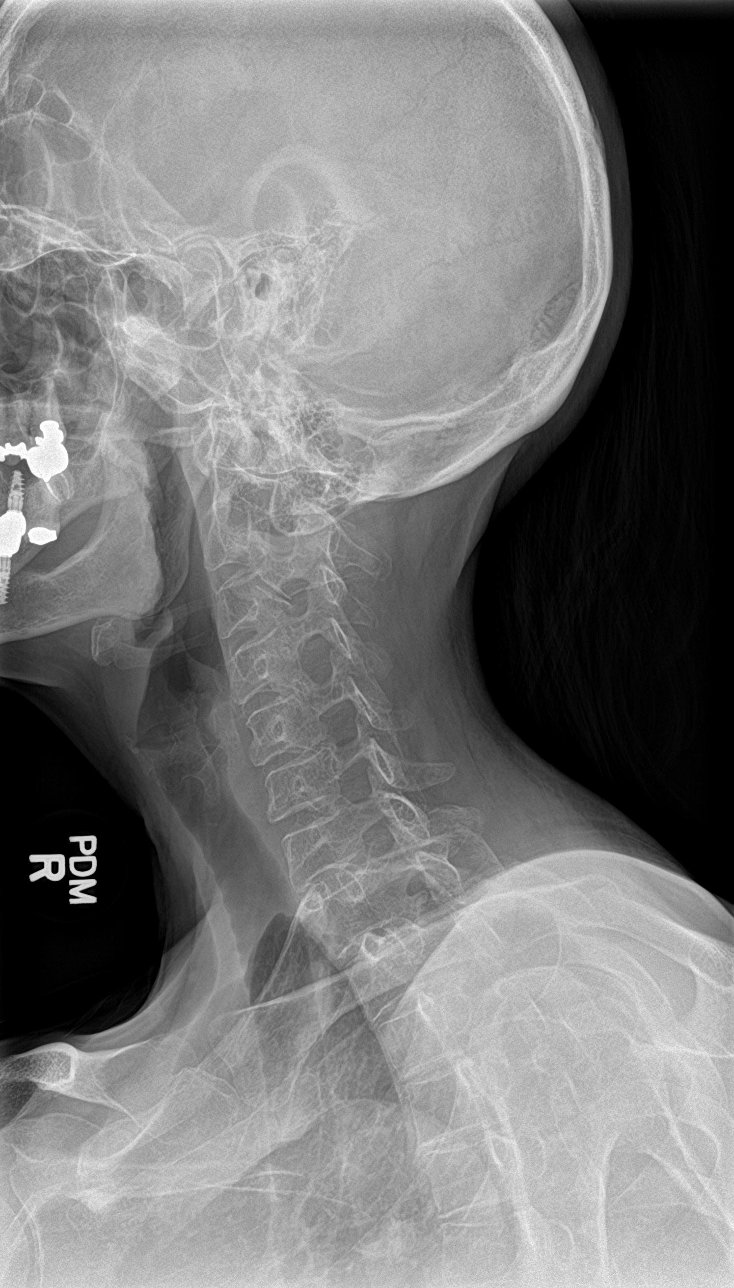

[c-spine ap]
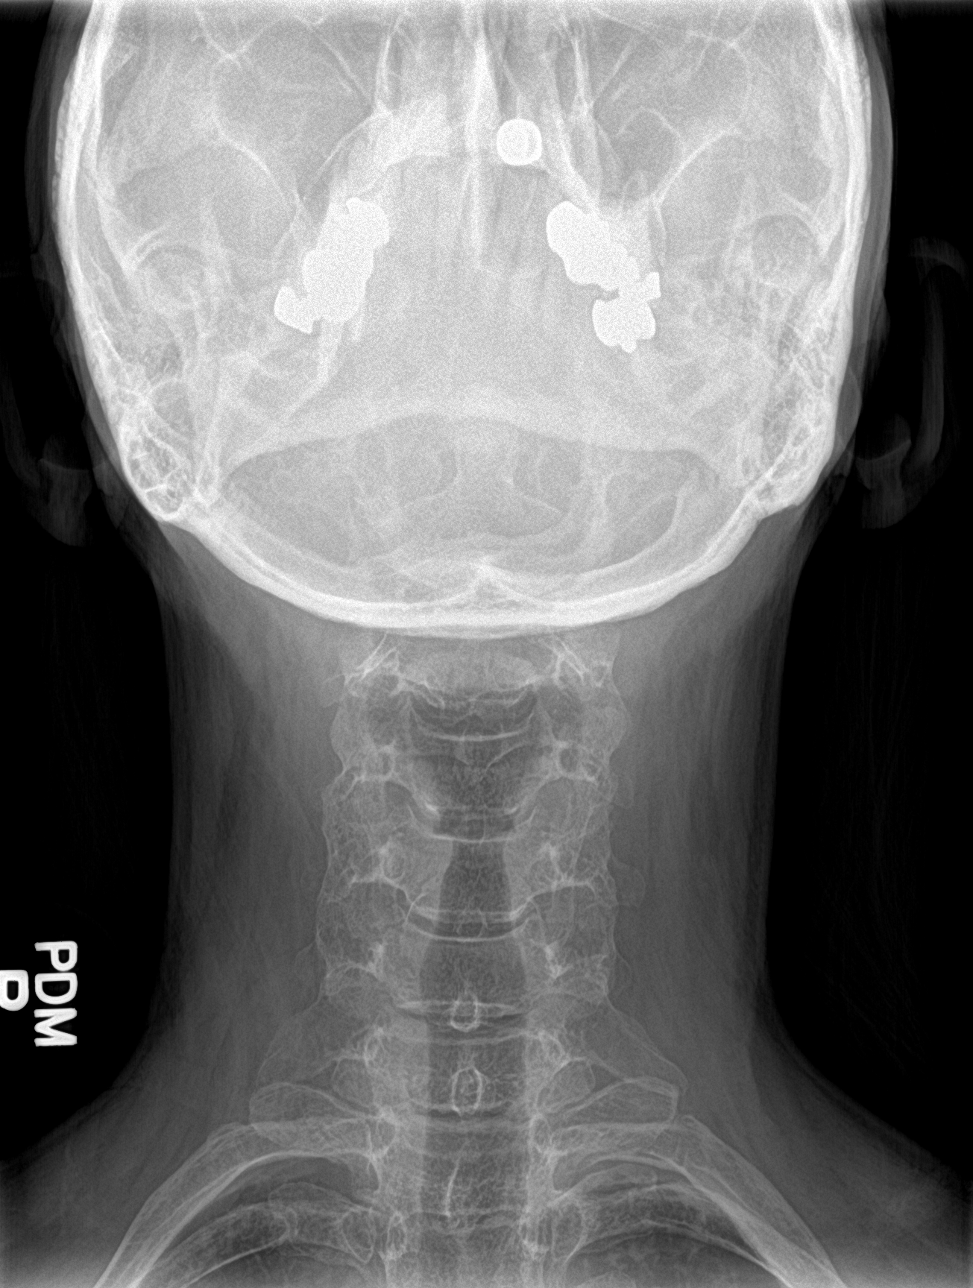

[c-spine open mouth]
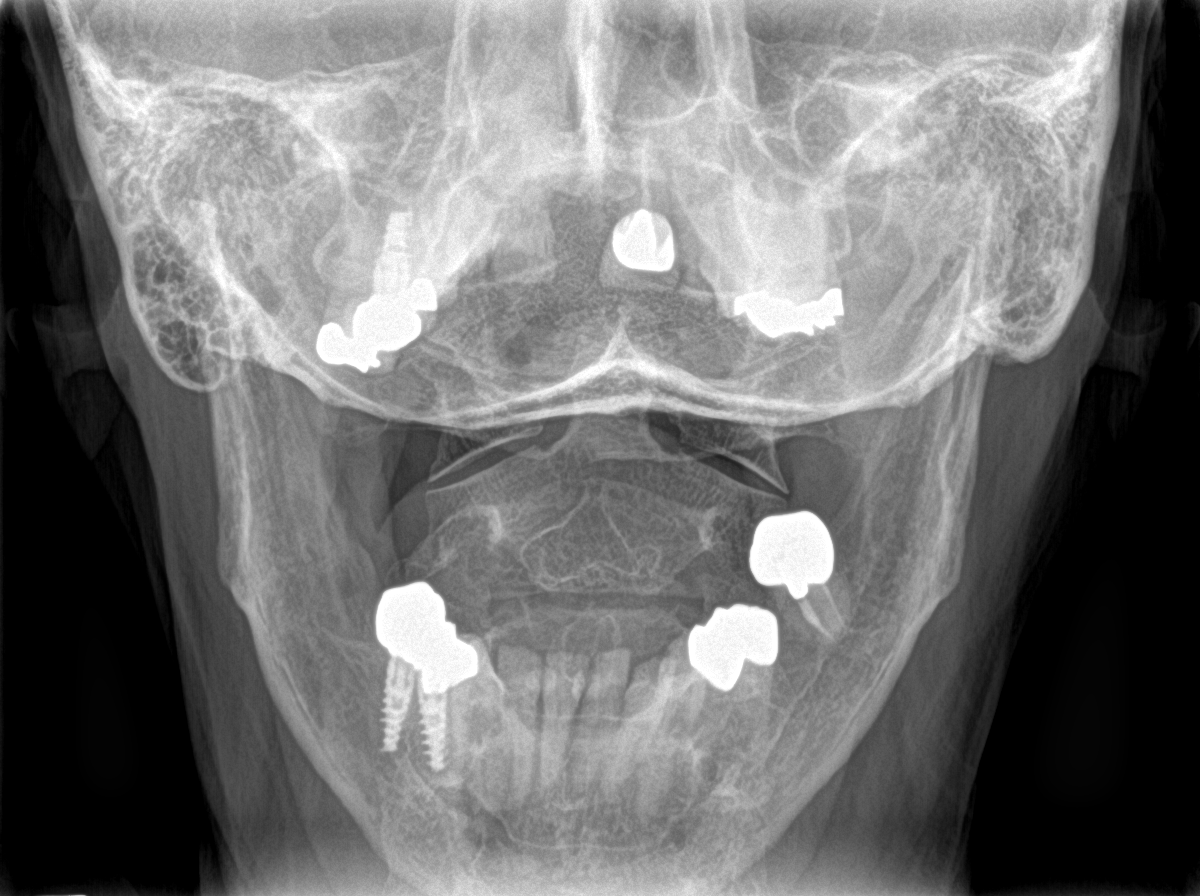

[5 of 5 positions shown; findings below may reference images not displayed]

FINDINGS: Frontal, lateral, open-mouth odontoid, bilateral oblique views were
obtained. There is no fracture or spondylolisthesis. Prevertebral
soft tissues and predental space regions are normal. There is
minimal disc space narrowing at C5-6. Other spaces appear
unremarkable. There is mild facet hypertrophy at C5-6 on the left.
Other facet regions appear unremarkable.

There is mild soft tissue prominence overlying the posterior lower
cervical region without associated well-defined mass by radiography.
Slight calcification is noted posterior to the T2 spinous process.
IMPRESSION: Mild soft tissue prominence posterior to the lower cervical region.
Mild calcification noted posterior to the T2 spinous process. Given
clinical symptoms posteriorly, it may be prudent to consider
cervical MR pre and post-contrast to further evaluate this area.

No fracture or spondylolisthesis. Slight disc space narrowing at
C5-6. Mild facet hypertrophy at C5-6 on the left. Other disc spaces
appear unremarkable.

## 2021-09-30 ENCOUNTER — Ambulatory Visit: Payer: BC Managed Care – PPO | Admitting: Rheumatology

## 2021-09-30 NOTE — Progress Notes (Deleted)
Office Visit Note  Patient: Crystal Dillon             Date of Birth: 04/03/1966           MRN: [id number]             PCP: Marijo File, MD Referring: Marijo File, MD Visit Date: 10/13/2021 Occupation: @GUAROCC @  Subjective:  No chief complaint on file.   History of Present Illness: Crystal Dillon is a 55 y.o. female ***   Activities of Daily Living:  Patient reports morning stiffness for *** {minute/hour:19697}.   Patient {ACTIONS;DENIES/REPORTS:21021675::"Denies"} nocturnal pain.  Difficulty dressing/grooming: {ACTIONS;DENIES/REPORTS:21021675::"Denies"} Difficulty climbing stairs: {ACTIONS;DENIES/REPORTS:21021675::"Denies"} Difficulty getting out of chair: {ACTIONS;DENIES/REPORTS:21021675::"Denies"} Difficulty using hands for taps, buttons, cutlery, and/or writing: {ACTIONS;DENIES/REPORTS:21021675::"Denies"}  No Rheumatology ROS completed.   PMFS History:  Patient Active Problem List   Diagnosis Date Noted   Pulmonary embolism ([place]) 02/09/2020   Primary osteoarthritis of both knees 12/14/2018   Hx of migraines 11/23/2018   Raynaud's syndrome without gangrene 11/23/2018   Chronic urticaria 11/23/2018   Family history of inflammatory bowel disease 11/23/2018   Upper respiratory infection 02/28/2017   Fibroids 09/20/2016   Anxiety state 09/20/2016   DDD (degenerative disc disease), lumbar 09/20/2016   Migraine 09/20/2016   Left hip pain 09/20/2016   Chronic pain of both knees 09/20/2016   Hyperglycemia 09/20/2016    Past Medical History:  Diagnosis Date   Anemia    Anxiety    Chronic urticaria 23/04/5731   Complication of anesthesia    Blood pressure drops low   COVID-19 virus infection 01/2020   Environmental and seasonal allergies    GERD (gastroesophageal reflux disease)    Migraine    OA (osteoarthritis)    Osteoporosis    Dr. Julien Girt   PONV (postoperative nausea and vomiting)    RAYNAUDS SYNDROME 01/06/2011   Qualifier: Diagnosis of  By:  Arnoldo Morale MD, John E    Uterine fibroid    diagnosed by OB/GYN Dr Mayra Neer   Varicose veins of left lower extremity with both ulcer of ankle and inflammation ([place]) 07/07/2011   Referral to interventional radiology for vascular study and potential intervention    Vertigo     Family History  Problem Relation Age of Onset   COPD Mother    Hyperlipidemia Mother    Diabetes Mother    Cancer Father        lung, smoker   Diabetes Father    Stroke Father    Colon cancer Maternal Grandmother    Diabetes Maternal Grandmother    Healthy Sister    Heart Problems Brother    Crohn's disease Son    Ulcerative colitis Daughter    Past Surgical History:  Procedure Laterality Date   ADENOIDECTOMY     CESAREAN SECTION     DENTAL SURGERY     HYSTERECTOMY ABDOMINAL WITH SALPINGO-OOPHORECTOMY Bilateral 06/13/2019   Procedure: HYSTERECTOMY ABDOMINAL WITH SALPINGO-OOPHORECTOMY;  Surgeon: Marylynn Pearson, MD;  Location: [facility];  Service: Gynecology;  Laterality: Bilateral;   HYSTEROSCOPY     TONSILLECTOMY     TUBAL LIGATION     Social History   Social History Narrative   Work or School: [employer]       Home Situation: lives with husband and son 65 yo, and niece whom is 66 yo      Spiritual Beliefs: Christian      Lifestyle:trying to eat healthy; no regular exercise   Immunization History  Administered Date(s) Administered  Influenza,inj,Quad PF,6+ Mos 09/20/2016, 10/03/2017   Moderna Sars-Covid-2 Vaccination 04/02/2020, 05/02/2020   Td 09/20/2005, 10/03/2017     Objective: Vital Signs: LMP 07/06/2011    Physical Exam   Musculoskeletal Exam: ***  CDAI Exam: CDAI Score: -- Patient Global: --; Provider Global: -- Swollen: --; Tender: -- Joint Exam 10/13/2021   No joint exam has been documented for this visit   There is currently no information documented on the homunculus. Go to the Rheumatology activity and complete the homunculus joint  exam.  Investigation: No additional findings.  Imaging: No results found.  Recent Labs: Lab Results  Component Value Date   WBC 7.3 02/10/2020   HGB 11.0 (L) 02/10/2020   PLT 285 02/10/2020   NA 133 (L) 02/10/2020   K 4.1 02/10/2020   CL 97 (L) 02/10/2020   CO2 25 02/10/2020   GLUCOSE 104 (H) 02/10/2020   BUN 7 02/10/2020   CREATININE 0.74 02/10/2020   BILITOT 0.7 02/10/2020   ALKPHOS 222 (H) 02/10/2020   AST 90 (H) 02/10/2020   ALT 144 (H) 02/10/2020   PROT 6.8 02/10/2020   ALBUMIN 3.0 (L) 02/10/2020   CALCIUM 9.0 02/10/2020   GFRAA >60 02/10/2020    Speciality Comments: No specialty comments available.  Procedures:  No procedures performed Allergies: Patient has no known allergies.   Assessment / Plan:     Visit Diagnoses: No diagnosis found.  Orders: No orders of the defined types were placed in this encounter.  No orders of the defined types were placed in this encounter.   Face-to-face time spent with patient was *** minutes. Greater than 50% of time was spent in counseling and coordination of care.  Follow-Up Instructions: No follow-ups on file.   Earnestine Mealing, CMA  Note - This record has been created using Editor, commissioning.  Chart creation errors have been sought, but may not always  have been located. Such creation errors do not reflect on  the standard of medical care.

## 2021-10-13 ENCOUNTER — Ambulatory Visit: Payer: BC Managed Care – PPO | Admitting: Rheumatology

## 2021-10-13 DIAGNOSIS — Z8669 Personal history of other diseases of the nervous system and sense organs: Secondary | ICD-10-CM

## 2021-10-13 DIAGNOSIS — M7701 Medial epicondylitis, right elbow: Secondary | ICD-10-CM

## 2021-10-13 DIAGNOSIS — F411 Generalized anxiety disorder: Secondary | ICD-10-CM

## 2021-10-13 DIAGNOSIS — M7061 Trochanteric bursitis, right hip: Secondary | ICD-10-CM

## 2021-10-13 DIAGNOSIS — M722 Plantar fascial fibromatosis: Secondary | ICD-10-CM

## 2021-10-13 DIAGNOSIS — R748 Abnormal levels of other serum enzymes: Secondary | ICD-10-CM

## 2021-10-13 DIAGNOSIS — Z86711 Personal history of pulmonary embolism: Secondary | ICD-10-CM

## 2021-10-13 DIAGNOSIS — Z8379 Family history of other diseases of the digestive system: Secondary | ICD-10-CM

## 2021-10-13 DIAGNOSIS — R768 Other specified abnormal immunological findings in serum: Secondary | ICD-10-CM

## 2021-10-13 DIAGNOSIS — L501 Idiopathic urticaria: Secondary | ICD-10-CM

## 2021-10-13 DIAGNOSIS — M19042 Primary osteoarthritis, left hand: Secondary | ICD-10-CM

## 2021-10-13 DIAGNOSIS — I7300 Raynaud's syndrome without gangrene: Secondary | ICD-10-CM

## 2021-10-13 DIAGNOSIS — M5136 Other intervertebral disc degeneration, lumbar region: Secondary | ICD-10-CM

## 2021-10-13 DIAGNOSIS — M19071 Primary osteoarthritis, right ankle and foot: Secondary | ICD-10-CM

## 2021-10-13 DIAGNOSIS — M170 Bilateral primary osteoarthritis of knee: Secondary | ICD-10-CM

## 2021-10-15 NOTE — Progress Notes (Signed)
Office Visit Note  Patient: Crystal Dillon             Date of Birth: Jul 27, 1966           MRN: [id number]             PCP: Marijo File, MD Referring: Marijo File, MD Visit Date: 10/20/2021 Occupation: @GUAROCC @  Subjective:  Joint stiffness.   History of Present Illness: Crystal Dillon is a 55 y.o. female with a history of positive ANA and osteoarthritis.  She denies any history of fatigue, oral ulcers, nasal ulcers, malar rash, photosensitivity, Raynaud's phenomenon, lymphadenopathy or hair loss..  She has some Dillon mouth and Dillon eyes symptoms which she relates to taking medications.  She continues to have some pain and discomfort in her joints due to underlying osteoarthritis which she describes in her hands knees and her feet.  She has not noticed any joint swelling.  She does typing and notices some discomfort in her left medial epicondyle.  She states she has been diagnosed with severe osteoporosis but she does not want to take any treatment and is concerned about the side effects of the medications.  Activities of Daily Living:  Patient reports morning stiffness for 30  minutes.   Patient Reports nocturnal pain.  Difficulty dressing/grooming: Denies Difficulty climbing stairs: Denies Difficulty getting out of chair: Denies Difficulty using hands for taps, buttons, cutlery, and/or writing: Denies  Review of Systems  Constitutional:  Negative for fatigue.  HENT:  Positive for mouth dryness and nose dryness. Negative for mouth sores.   Eyes:  Positive for dryness. Negative for pain and itching.  Respiratory:  Negative for shortness of breath and difficulty breathing.   Cardiovascular:  Negative for chest pain and palpitations.  Gastrointestinal:  Negative for blood in stool, constipation and diarrhea.  Endocrine: Negative for increased urination.  Genitourinary:  Negative for difficulty urinating.  Musculoskeletal:  Positive for joint pain, joint pain and morning  stiffness. Negative for joint swelling, myalgias, muscle tenderness and myalgias.  Skin:  Negative for color change, rash, hair loss, redness and sensitivity to sunlight.  Allergic/Immunologic: Positive for susceptible to infections.  Neurological:  Positive for dizziness. Negative for numbness, headaches, memory loss and weakness.  Hematological:  Positive for bruising/bleeding tendency. Negative for swollen glands.  Psychiatric/Behavioral:  Positive for sleep disturbance. Negative for depressed mood and confusion. The patient is nervous/anxious.    PMFS History:  Patient Active Problem List   Diagnosis Date Noted   Pulmonary embolism ([place]) 02/09/2020   Primary osteoarthritis of both knees 12/14/2018   Hx of migraines 11/23/2018   Raynaud's syndrome without gangrene 11/23/2018   Chronic urticaria 11/23/2018   Family history of inflammatory bowel disease 11/23/2018   Upper respiratory infection 02/28/2017   Fibroids 09/20/2016   Anxiety state 09/20/2016   DDD (degenerative disc disease), lumbar 09/20/2016   Migraine 09/20/2016   Left hip pain 09/20/2016   Chronic pain of both knees 09/20/2016   Hyperglycemia 09/20/2016    Past Medical History:  Diagnosis Date   Anemia    Anxiety    Chronic urticaria 97/67/3419   Complication of anesthesia    Blood pressure drops low   COVID-19 virus infection 01/2020   Environmental and seasonal allergies    GERD (gastroesophageal reflux disease)    Migraine    OA (osteoarthritis)    Osteoporosis    Dr. Julien Girt   Osteoporosis    dx by GYN   PONV (postoperative nausea and vomiting)  RAYNAUDS SYNDROME 01/06/2011   Qualifier: Diagnosis of  By: Arnoldo Morale MD, Balinda Quails    Uterine fibroid    diagnosed by OB/GYN Dr Mayra Neer   Varicose veins of left lower extremity with both ulcer of ankle and inflammation ([place]) 07/07/2011   Referral to interventional radiology for vascular study and potential intervention    Vertigo     Family History   Problem Relation Age of Onset   COPD Mother    Hyperlipidemia Mother    Diabetes Mother    Cancer Father        lung, smoker   Diabetes Father    Stroke Father    Colon cancer Maternal Grandmother    Diabetes Maternal Grandmother    Healthy Sister    Heart Problems Brother    Crohn's disease Son    Ulcerative colitis Daughter    Past Surgical History:  Procedure Laterality Date   ADENOIDECTOMY     CESAREAN SECTION     DENTAL SURGERY     HYSTERECTOMY ABDOMINAL WITH SALPINGO-OOPHORECTOMY Bilateral 06/13/2019   Procedure: HYSTERECTOMY ABDOMINAL WITH SALPINGO-OOPHORECTOMY;  Surgeon: Marylynn Pearson, MD;  Location: [facility];  Service: Gynecology;  Laterality: Bilateral;   HYSTEROSCOPY     TONSILLECTOMY     TUBAL LIGATION     Social History   Social History Narrative   Work or School: [employer]       Home Situation: lives with husband and son 76 yo, and niece whom is 6 yo      Spiritual Beliefs: Christian      Lifestyle:trying to eat healthy; no regular exercise   Immunization History  Administered Date(s) Administered   Influenza,inj,Quad PF,6+ Mos 09/20/2016, 10/03/2017   Moderna Sars-Covid-2 Vaccination 04/02/2020, 05/02/2020   Td 09/20/2005, 10/03/2017     Objective: Vital Signs: BP 129/85 (BP Location: Left Arm, Patient Position: Sitting, Cuff Size: Normal)   Pulse (!) 56   Ht $R'5\' 2"'RR$  (1.575 m)   Wt 124 lb 9.6 oz (56.5 kg)   LMP 07/06/2011   BMI 22.79 kg/m    Physical Exam Vitals and nursing note reviewed.  Constitutional:      Appearance: She is well-developed.  HENT:     Head: Normocephalic and atraumatic.  Eyes:     Conjunctiva/sclera: Conjunctivae normal.  Cardiovascular:     Rate and Rhythm: Normal rate and regular rhythm.     Heart sounds: Normal heart sounds.  Pulmonary:     Effort: Pulmonary effort is normal.     Breath sounds: Normal breath sounds.  Abdominal:     General: Bowel sounds are normal.     Palpations: Abdomen  is soft.  Musculoskeletal:     Cervical back: Normal range of motion.  Lymphadenopathy:     Cervical: No cervical adenopathy.  Skin:    General: Skin is warm and Dillon.     Capillary Refill: Capillary refill takes less than 2 seconds.  Neurological:     Mental Status: She is alert and oriented to person, place, and time.  Psychiatric:        Behavior: Behavior normal.     Musculoskeletal Exam: C-spine, thoracic and lumbar spine were in good range of motion.  Shoulder joints, elbow joints, wrist joints were in good range of motion with no synovitis.  She had bilateral PIP and DIP thickening with no synovitis.  She had mild tenderness over left medial epicondyle region.  Hip joints and knee joints with good range of motion without any warmth  swelling or effusion.  There was no tenderness over ankles or MTPs.  There was no tenderness over plantar fascia.  CDAI Exam: CDAI Score: -- Patient Global: --; Provider Global: -- Swollen: --; Tender: -- Joint Exam 10/20/2021   No joint exam has been documented for this visit   There is currently no information documented on the homunculus. Go to the Rheumatology activity and complete the homunculus joint exam.  Investigation: No additional findings.  Imaging: No results found.  Recent Labs: Lab Results  Component Value Date   WBC 7.3 02/10/2020   HGB 11.0 (L) 02/10/2020   PLT 285 02/10/2020   NA 133 (L) 02/10/2020   K 4.1 02/10/2020   CL 97 (L) 02/10/2020   CO2 25 02/10/2020   GLUCOSE 104 (H) 02/10/2020   BUN 7 02/10/2020   CREATININE 0.74 02/10/2020   BILITOT 0.7 02/10/2020   ALKPHOS 222 (H) 02/10/2020   AST 90 (H) 02/10/2020   ALT 144 (H) 02/10/2020   PROT 6.8 02/10/2020   ALBUMIN 3.0 (L) 02/10/2020   CALCIUM 9.0 02/10/2020   GFRAA >60 02/10/2020    Speciality Comments: No specialty comments available.  Procedures:  No procedures performed Allergies: Patient has no known allergies.   Assessment / Plan:     Visit  Diagnoses: Positive ANA (antinuclear antibody) - AVISE 11/23/18: Negative index: -1.9.  ANA negative. AVISE on 09/17/20: negative index-0.8 ANA+, no titer: There is no history of oral ulcers, nasal ulcers, malar rash, photosensitivity, Raynaud's phenomenon, lymphadenopathy or fatigue.  There is no history of inflammatory arthritis.  She has mild sicca symptoms.  No further work-up is needed.  Chronic idiopathic urticaria - Followed by Dr. Nelva Bush. Current regimen: Xolair, Allegra, Xyzal, Pepcid, and Singulair.  She states her symptoms improved after she had COVID-19 infection.  Raynaud's disease without gangrene-her symptoms are mild during the winter months only.  She is asymptomatic currently.  There is no nailbed capillary changes or sclerodactyly on my examination.  She had good capillary refill.  Primary osteoarthritis of both hands-she has bilateral PIP and DIP thickening with no synovitis.  Joint protection muscle strengthening was discussed.  Primary osteoarthritis of both knees-she complains of some stiffness in her knee joints.  No warmth swelling or effusion was noted.  Primary osteoarthritis of right foot-currently asymptomatic.  Plantar fasciitis -she has intermittent symptoms and currently not active.  DDD (degenerative disc disease), lumbar-she continues to have some lower back pain.  Elevated alkaline phosphatase level - Alk phos 222 on 02/10/20.  I will obtain CMP with GFR.  I will obtain total body bone scan if she has persistently elevated LFTs and alkaline phosphatase.Marland Kitchen  History of pulmonary embolism - Dx with Covid-19 in February 2021-hospitalized with pneumonia + PE.   Medial epicondylitis of both elbows-she continues to have some discomfort over medial epicondyle region.  Stretching exercises were discussed.  Anxiety state  Hx of migraines  Age-related osteoporosis without current pathological fracture-patient gives history of osteoporosis.  I do not have DEXA scan  results.  I advised her to bring the DEXA report at the follow-up visit to discuss treatment options.  Family history of inflammatory bowel disease  Orders: Orders Placed This Encounter  Procedures   COMPLETE METABOLIC PANEL WITH GFR    No orders of the defined types were placed in this encounter.    Follow-Up Instructions: Return in about 3 months (around 01/20/2022) for Osteoporosis, Osteoarthritis, +ANA.   Bo Merino, MD  Note - This record has been created  using Editor, commissioning.  Chart creation errors have been sought, but may not always  have been located. Such creation errors do not reflect on  the standard of medical care.

## 2021-10-20 ENCOUNTER — Other Ambulatory Visit: Payer: Self-pay

## 2021-10-20 ENCOUNTER — Ambulatory Visit: Payer: BC Managed Care – PPO | Admitting: Rheumatology

## 2021-10-20 ENCOUNTER — Encounter: Payer: Self-pay | Admitting: Rheumatology

## 2021-10-20 VITALS — BP 129/85 | HR 56 | Ht 62.0 in | Wt 124.6 lb

## 2021-10-20 DIAGNOSIS — Z86711 Personal history of pulmonary embolism: Secondary | ICD-10-CM

## 2021-10-20 DIAGNOSIS — M19041 Primary osteoarthritis, right hand: Secondary | ICD-10-CM

## 2021-10-20 DIAGNOSIS — M170 Bilateral primary osteoarthritis of knee: Secondary | ICD-10-CM

## 2021-10-20 DIAGNOSIS — M51369 Other intervertebral disc degeneration, lumbar region without mention of lumbar back pain or lower extremity pain: Secondary | ICD-10-CM

## 2021-10-20 DIAGNOSIS — M5136 Other intervertebral disc degeneration, lumbar region: Secondary | ICD-10-CM

## 2021-10-20 DIAGNOSIS — M19071 Primary osteoarthritis, right ankle and foot: Secondary | ICD-10-CM

## 2021-10-20 DIAGNOSIS — R7689 Other specified abnormal immunological findings in serum: Secondary | ICD-10-CM

## 2021-10-20 DIAGNOSIS — M7702 Medial epicondylitis, left elbow: Secondary | ICD-10-CM

## 2021-10-20 DIAGNOSIS — I7300 Raynaud's syndrome without gangrene: Secondary | ICD-10-CM | POA: Diagnosis not present

## 2021-10-20 DIAGNOSIS — M722 Plantar fascial fibromatosis: Secondary | ICD-10-CM

## 2021-10-20 DIAGNOSIS — M810 Age-related osteoporosis without current pathological fracture: Secondary | ICD-10-CM

## 2021-10-20 DIAGNOSIS — R748 Abnormal levels of other serum enzymes: Secondary | ICD-10-CM

## 2021-10-20 DIAGNOSIS — Z8379 Family history of other diseases of the digestive system: Secondary | ICD-10-CM

## 2021-10-20 DIAGNOSIS — M7701 Medial epicondylitis, right elbow: Secondary | ICD-10-CM

## 2021-10-20 DIAGNOSIS — L501 Idiopathic urticaria: Secondary | ICD-10-CM | POA: Diagnosis not present

## 2021-10-20 DIAGNOSIS — R768 Other specified abnormal immunological findings in serum: Secondary | ICD-10-CM

## 2021-10-20 DIAGNOSIS — Z8669 Personal history of other diseases of the nervous system and sense organs: Secondary | ICD-10-CM

## 2021-10-20 DIAGNOSIS — M19042 Primary osteoarthritis, left hand: Secondary | ICD-10-CM

## 2021-10-20 DIAGNOSIS — F411 Generalized anxiety disorder: Secondary | ICD-10-CM

## 2021-10-21 ENCOUNTER — Telehealth: Payer: Self-pay

## 2021-10-21 NOTE — Telephone Encounter (Signed)
Attempted to contact patient and left message on machine to advise patient we need to re-check the CMP prior to ordering the total body bone scan per Dr. Estanislado Pandy. Provided patient with lab hours and CMP order is in place.    Per Dr. Estanislado Pandy, pending Alk phos results, we will place an order for a total body bone scan. Thanks!

## 2021-10-22 NOTE — Telephone Encounter (Signed)
Spoke with patient and advised we will need to recheck a CMP prior to ordering the total body bone scan. Patient states she will come to the office Tuesday to have that drawn.

## 2021-10-29 NOTE — Telephone Encounter (Signed)
Spoke with patient, she was unable to come to the office to have the labs drawn. Patient will come next week to update.

## 2021-11-03 ENCOUNTER — Other Ambulatory Visit: Payer: Self-pay | Admitting: *Deleted

## 2021-11-03 DIAGNOSIS — R748 Abnormal levels of other serum enzymes: Secondary | ICD-10-CM

## 2021-11-03 NOTE — Telephone Encounter (Signed)
Spoke with patient and she states she will come into the office today to have labs drawn.

## 2021-11-04 LAB — COMPLETE METABOLIC PANEL WITH GFR
AG Ratio: 1.8 (calc) (ref 1.0–2.5)
ALT: 22 U/L (ref 6–29)
AST: 19 U/L (ref 10–35)
Albumin: 4.6 g/dL (ref 3.6–5.1)
Alkaline phosphatase (APISO): 163 U/L — ABNORMAL HIGH (ref 37–153)
BUN: 13 mg/dL (ref 7–25)
CO2: 27 mmol/L (ref 20–32)
Calcium: 9.5 mg/dL (ref 8.6–10.4)
Chloride: 104 mmol/L (ref 98–110)
Creat: 0.91 mg/dL (ref 0.50–1.03)
Globulin: 2.5 g/dL (calc) (ref 1.9–3.7)
Glucose, Bld: 88 mg/dL (ref 65–99)
Potassium: 4.6 mmol/L (ref 3.5–5.3)
Sodium: 139 mmol/L (ref 135–146)
Total Bilirubin: 0.3 mg/dL (ref 0.2–1.2)
Total Protein: 7.1 g/dL (ref 6.1–8.1)
eGFR: 74 mL/min/{1.73_m2} (ref >=60)

## 2021-11-04 NOTE — Telephone Encounter (Signed)
Alkaline phosphatase is much better now.  We will continue to monitor.

## 2021-11-04 NOTE — Telephone Encounter (Signed)
Patient advised Alkaline phosphatase is improved and is mildly elevated.  We will continue to monitor labs.

## 2021-11-04 NOTE — Progress Notes (Signed)
Alkaline phosphatase is improved and is mildly elevated.  We will continue to monitor labs.

## 2021-11-04 NOTE — Telephone Encounter (Signed)
Patient had labs completed. Alkaline phosphatase 163.  Do we need to place order for total body bone scan?

## 2022-01-07 NOTE — Progress Notes (Deleted)
Office Visit Note  Patient: Crystal Dillon             Date of Birth: 1966-08-02           MRN: [id number]             PCP: Marijo File, MD Referring: Marijo File, MD Visit Date: 01/20/2022 Occupation: @GUAROCC @  Subjective:  No chief complaint on file.   History of Present Illness: Crystal Dillon is a 56 y.o. female ***   Activities of Daily Living:  Patient reports morning stiffness for *** {minute/hour:19697}.   Patient {ACTIONS;DENIES/REPORTS:21021675::"Denies"} nocturnal pain.  Difficulty dressing/grooming: {ACTIONS;DENIES/REPORTS:21021675::"Denies"} Difficulty climbing stairs: {ACTIONS;DENIES/REPORTS:21021675::"Denies"} Difficulty getting out of chair: {ACTIONS;DENIES/REPORTS:21021675::"Denies"} Difficulty using hands for taps, buttons, cutlery, and/or writing: {ACTIONS;DENIES/REPORTS:21021675::"Denies"}  No Rheumatology ROS completed.   PMFS History:  Patient Active Problem List   Diagnosis Date Noted   Pulmonary embolism ([place]) 02/09/2020   Primary osteoarthritis of both knees 12/14/2018   Hx of migraines 11/23/2018   Raynaud's syndrome without gangrene 11/23/2018   Chronic urticaria 11/23/2018   Family history of inflammatory bowel disease 11/23/2018   Upper respiratory infection 02/28/2017   Fibroids 09/20/2016   Anxiety state 09/20/2016   DDD (degenerative disc disease), lumbar 09/20/2016   Migraine 09/20/2016   Left hip pain 09/20/2016   Chronic pain of both knees 09/20/2016   Hyperglycemia 09/20/2016    Past Medical History:  Diagnosis Date   Anemia    Anxiety    Chronic urticaria 31/49/7026   Complication of anesthesia    Blood pressure drops low   COVID-19 virus infection 01/2020   Environmental and seasonal allergies    GERD (gastroesophageal reflux disease)    Migraine    OA (osteoarthritis)    Osteoporosis    Dr. Julien Girt   Osteoporosis    dx by GYN   PONV (postoperative nausea and vomiting)    RAYNAUDS SYNDROME 01/06/2011    Qualifier: Diagnosis of  By: Arnoldo Morale MD, John E    Uterine fibroid    diagnosed by OB/GYN Dr Mayra Neer   Varicose veins of left lower extremity with both ulcer of ankle and inflammation ([place]) 07/07/2011   Referral to interventional radiology for vascular study and potential intervention    Vertigo     Family History  Problem Relation Age of Onset   COPD Mother    Hyperlipidemia Mother    Diabetes Mother    Cancer Father        lung, smoker   Diabetes Father    Stroke Father    Colon cancer Maternal Grandmother    Diabetes Maternal Grandmother    Healthy Sister    Heart Problems Brother    Crohn's disease Son    Ulcerative colitis Daughter    Past Surgical History:  Procedure Laterality Date   ADENOIDECTOMY     CESAREAN SECTION     DENTAL SURGERY     HYSTERECTOMY ABDOMINAL WITH SALPINGO-OOPHORECTOMY Bilateral 06/13/2019   Procedure: HYSTERECTOMY ABDOMINAL WITH SALPINGO-OOPHORECTOMY;  Surgeon: Marylynn Pearson, MD;  Location: [place];  Service: Gynecology;  Laterality: Bilateral;   HYSTEROSCOPY     TONSILLECTOMY     TUBAL LIGATION     Social History   Social History Narrative   Work or School: [employer]       Home Situation: lives with husband and son 67 yo, and niece whom is 73 yo      Spiritual Beliefs: Christian      Lifestyle:trying to eat healthy; no regular  exercise   Immunization History  Administered Date(s) Administered   Influenza,inj,Quad PF,6+ Mos 09/20/2016, 10/03/2017   Moderna Sars-Covid-2 Vaccination 04/02/2020, 05/02/2020   Td 09/20/2005, 10/03/2017     Objective: Vital Signs: LMP 07/06/2011    Physical Exam   Musculoskeletal Exam: ***  CDAI Exam: CDAI Score: -- Patient Global: --; Provider Global: -- Swollen: --; Tender: -- Joint Exam 01/20/2022   No joint exam has been documented for this visit   There is currently no information documented on the homunculus. Go to the Rheumatology activity and complete the  homunculus joint exam.  Investigation: No additional findings.  Imaging: No results found.  Recent Labs: Lab Results  Component Value Date   WBC 7.3 02/10/2020   HGB 11.0 (L) 02/10/2020   PLT 285 02/10/2020   NA 139 11/03/2021   K 4.6 11/03/2021   CL 104 11/03/2021   CO2 27 11/03/2021   GLUCOSE 88 11/03/2021   BUN 13 11/03/2021   CREATININE 0.91 11/03/2021   BILITOT 0.3 11/03/2021   ALKPHOS 222 (H) 02/10/2020   AST 19 11/03/2021   ALT 22 11/03/2021   PROT 7.1 11/03/2021   ALBUMIN 3.0 (L) 02/10/2020   CALCIUM 9.5 11/03/2021   GFRAA >60 02/10/2020    Speciality Comments: No specialty comments available.  Procedures:  No procedures performed Allergies: Patient has no known allergies.   Assessment / Plan:     Visit Diagnoses: Positive ANA (antinuclear antibody)  Raynaud's disease without gangrene  Chronic idiopathic urticaria  Primary osteoarthritis of both hands  Medial epicondylitis of both elbows  Primary osteoarthritis of both knees  Primary osteoarthritis of right foot  Plantar fasciitis  Trochanteric bursitis of both hips  DDD (degenerative disc disease), lumbar  Age-related osteoporosis without current pathological fracture  History of pulmonary embolism  Hx of migraines  Family history of inflammatory bowel disease  History of migraine  Orders: No orders of the defined types were placed in this encounter.  No orders of the defined types were placed in this encounter.   Face-to-face time spent with patient was *** minutes. Greater than 50% of time was spent in counseling and coordination of care.  Follow-Up Instructions: No follow-ups on file.   Ofilia Neas, PA-C  Note - This record has been created using Dragon software.  Chart creation errors have been sought, but may not always  have been located. Such creation errors do not reflect on  the standard of medical care.

## 2022-01-20 ENCOUNTER — Ambulatory Visit: Payer: BC Managed Care – PPO | Admitting: Physician Assistant

## 2022-01-20 DIAGNOSIS — Z8669 Personal history of other diseases of the nervous system and sense organs: Secondary | ICD-10-CM

## 2022-01-20 DIAGNOSIS — M810 Age-related osteoporosis without current pathological fracture: Secondary | ICD-10-CM

## 2022-01-20 DIAGNOSIS — Z8379 Family history of other diseases of the digestive system: Secondary | ICD-10-CM

## 2022-01-20 DIAGNOSIS — Z86711 Personal history of pulmonary embolism: Secondary | ICD-10-CM

## 2022-01-20 DIAGNOSIS — I7300 Raynaud's syndrome without gangrene: Secondary | ICD-10-CM

## 2022-01-20 DIAGNOSIS — M19041 Primary osteoarthritis, right hand: Secondary | ICD-10-CM

## 2022-01-20 DIAGNOSIS — L501 Idiopathic urticaria: Secondary | ICD-10-CM

## 2022-01-20 DIAGNOSIS — M19071 Primary osteoarthritis, right ankle and foot: Secondary | ICD-10-CM

## 2022-01-20 DIAGNOSIS — R768 Other specified abnormal immunological findings in serum: Secondary | ICD-10-CM

## 2022-01-20 DIAGNOSIS — M722 Plantar fascial fibromatosis: Secondary | ICD-10-CM

## 2022-01-20 DIAGNOSIS — M7701 Medial epicondylitis, right elbow: Secondary | ICD-10-CM

## 2022-01-20 DIAGNOSIS — M5136 Other intervertebral disc degeneration, lumbar region: Secondary | ICD-10-CM

## 2022-01-20 DIAGNOSIS — M7061 Trochanteric bursitis, right hip: Secondary | ICD-10-CM

## 2022-01-20 DIAGNOSIS — M170 Bilateral primary osteoarthritis of knee: Secondary | ICD-10-CM

## 2023-07-24 ENCOUNTER — Ambulatory Visit (HOSPITAL_COMMUNITY)
Admission: EM | Admit: 2023-07-24 | Discharge: 2023-07-24 | Disposition: A | Discharge status: Home / Self Care | Payer: BC Managed Care – PPO

## 2023-07-24 ENCOUNTER — Ambulatory Visit (INDEPENDENT_AMBULATORY_CARE_PROVIDER_SITE_OTHER): Payer: BC Managed Care – PPO

## 2023-07-24 ENCOUNTER — Encounter (HOSPITAL_COMMUNITY): Payer: Self-pay

## 2023-07-24 DIAGNOSIS — S6992XA Unspecified injury of left wrist, hand and finger(s), initial encounter: Secondary | ICD-10-CM | POA: Diagnosis not present

## 2023-07-24 NOTE — Discharge Instructions (Addendum)
The xray does not show any fracture. There is some mild joint space narrowing that is not related to your injury. You can follow up with your primary care provider or orthopedic specialist if the finger is still bothering you or pain gets worse.  I recommend to continue ibuprofen and/or tylenol for pain. It may be sore for the next several days. Apply ice to the finger, protected by towel or cloth. Use for 10-15 minutes a few times daily. Elevate the hand to reduce swelling and the throbbing sensation.  The nail may or may not fall off on its own, although it seems your nailbed is intact and should continue to grow out normally.  If nail does fall off, keep the area clean and dry. I recommend a bandage to protect the underlying soft tissue. The nail can grow back normally in most cases.  Please return with any questions or concerns

## 2023-07-24 NOTE — ED Triage Notes (Signed)
Patient here today with c/o left 4th finger injury yesterday. Patient slammed her finger in a door. Finger is throbbing and pressure under the nail. Blood under the nail.

## 2023-07-24 NOTE — ED Provider Notes (Signed)
[facility]    CSN: [id number] Arrival date & time: 07/24/23  3818      History   Chief Complaint Chief Complaint  Patient presents with   Finger Injury    HPI Crystal Dillon is a 57 y.o. female.  Yesterday slammed finger in a door Left hand ring finger Pain and pressure rated 8/10, minimal bleeding under the nail Throbbing sensation at distal tip. No pain with finger movement.  Took ibuprofen/tylenol   No prior injury to this hand  Past Medical History:  Diagnosis Date   Anemia    Anxiety    Chronic urticaria 11/23/2018   Complication of anesthesia    Blood pressure drops low   COVID-19 virus infection 01/2020   Environmental and seasonal allergies    GERD (gastroesophageal reflux disease)    Migraine    OA (osteoarthritis)    Osteoporosis    Dr. Renaldo Fiddler   Osteoporosis    dx by GYN   PONV (postoperative nausea and vomiting)    RAYNAUDS SYNDROME 01/06/2011   Qualifier: Diagnosis of  By: Lovell Sheehan MD, John E    Uterine fibroid    diagnosed by OB/GYN Dr Alecia Lemming   Varicose veins of left lower extremity with both ulcer of ankle and inflammation (HCC) 07/07/2011   Referral to interventional radiology for vascular study and potential intervention    Vertigo     Patient Active Problem List   Diagnosis Date Noted   Pulmonary embolism (HCC) 02/09/2020   Primary osteoarthritis of both knees 12/14/2018   Hx of migraines 11/23/2018   Raynaud's syndrome without gangrene 11/23/2018   Chronic urticaria 11/23/2018   Family history of inflammatory bowel disease 11/23/2018   Upper respiratory infection 02/28/2017   Fibroids 09/20/2016   Anxiety state 09/20/2016   DDD (degenerative disc disease), lumbar 09/20/2016   Migraine 09/20/2016   Left hip pain 09/20/2016   Chronic pain of both knees 09/20/2016   Hyperglycemia 09/20/2016    Past Surgical History:  Procedure Laterality Date   ADENOIDECTOMY     CESAREAN SECTION     DENTAL SURGERY      HYSTERECTOMY ABDOMINAL WITH SALPINGO-OOPHORECTOMY Bilateral 06/13/2019   Procedure: HYSTERECTOMY ABDOMINAL WITH SALPINGO-OOPHORECTOMY;  Surgeon: Zelphia Cairo, MD;  Location: [facility];  Service: Gynecology;  Laterality: Bilateral;   HYSTEROSCOPY     TONSILLECTOMY     TUBAL LIGATION      OB History   No obstetric history on file.      Home Medications    Prior to Admission medications   Medication Sig Start Date End Date Taking? Authorizing Provider  Cholecalciferol (VITAMIN D3 PO) Take 3,000 Units by mouth daily.   Yes [provider]  Dupilumab (DUPIXENT [place]) Inject into the skin.   Yes [provider]  eletriptan (RELPAX) 40 MG tablet TAKE 1 TABLET BY MOUTH AT ONSET OF HEADACHE MAY REPEAT IN 2 HOURS IF PERSISTS OR REOCCURS 08/12/19  Yes Kriste Basque R, DO  famotidine (PEPCID) 20 MG tablet TAKE 1 TABLET BY MOUTH TWICE A DAY 10/29/19  Yes Koberlein, Junell C, MD  FLUoxetine (PROZAC) 20 MG capsule TAKE 1 CAPSULE BY MOUTH EVERY DAY Patient taking differently: Take 20 mg by mouth daily. 11/20/19  Yes Koberlein, Junell C, MD  ondansetron (ZOFRAN) 4 MG tablet TAKE 1 TABLET BY MOUTH EVERY 8 HOURS AS NEEDED FOR NAUSEA OR VOMITING (MIGRAINES). Patient taking differently: Take 4 mg by mouth every 8 (eight) hours as needed for nausea or vomiting. 11/20/19  Yes Koberlein, Junell C, MD  albuterol (VENTOLIN HFA) 108 (90 Base) MCG/ACT inhaler Inhale 2 puffs into the lungs every 6 (six) hours as needed for wheezing. 01/27/20 10/20/21  [provider]  EPINEPHrine (AUVI-Q) 0.3 mg/0.3 mL IJ SOAJ injection Use as directed for severe allergic reactions 12/31/19   Marcelyn Bruins, MD  gabapentin (NEURONTIN) 100 MG capsule Take 100 mg by mouth daily.  12/30/19 10/20/21  [provider]  Multiple Vitamins-Minerals (ONE-A-DAY WOMENS 50+ ADVANTAGE) TABS Take 1 tablet by mouth daily.  06/18/19   [provider]    Family History Family History   Problem Relation Age of Onset   COPD Mother    Hyperlipidemia Mother    Diabetes Mother    Cancer Father        lung, smoker   Diabetes Father    Stroke Father    Colon cancer Maternal Grandmother    Diabetes Maternal Grandmother    Healthy Sister    Heart Problems Brother    Crohn's disease Son    Ulcerative colitis Daughter     Social History Social History   Tobacco Use   Smoking status: Never   Smokeless tobacco: Never  Vaping Use   Vaping status: Never Used  Substance Use Topics   Alcohol use: Yes    Comment: rare   Drug use: No     Allergies   Hydrocodone and Oxycodone   Review of Systems Review of Systems As per HPI  Physical Exam Triage Vital Signs ED Triage Vitals  Encounter Vitals Group     BP 07/24/23 0958 128/88     Systolic BP Percentile --      Diastolic BP Percentile --      Pulse Rate 07/24/23 0958 61     Resp 07/24/23 0958 16     Temp 07/24/23 0958 97.9 F (36.6 C)     Temp Source 07/24/23 0958 Oral     SpO2 07/24/23 0958 97 %     Weight 07/24/23 0958 120 lb (54.4 kg)     Height 07/24/23 0958 5\' 2"  (1.575 m)     Head Circumference --      Peak Flow --      Pain Score 07/24/23 0957 8     Pain Loc --      Pain Education --      Exclude from Growth Chart --    No data found.  Updated Vital Signs BP 128/88 (BP Location: Left Arm)   Pulse 61   Temp 97.9 F (36.6 C) (Oral)   Resp 16   Ht 5\' 2"  (1.575 m)   Wt 120 lb (54.4 kg)   LMP 07/06/2011   SpO2 97%   BMI 21.95 kg/m     Physical Exam Vitals and nursing note reviewed.  Cardiovascular:     Rate and Rhythm: Normal rate and regular rhythm.     Pulses: Normal pulses.  Pulmonary:     Effort: Pulmonary effort is normal.  Musculoskeletal:        General: Swelling and signs of injury present. No deformity.       Hands:     Comments: Swelling at left hand ring finger pad. No open skin, minimal bruising. The nail is intact. Minimal bleeding from lateral nail. Nailbed is  intact. About 30% light subungual hematoma.  Good ROM at MCP, PIP and DIP. Distal sensation intact. Cap refill < 2 seconds.  Skin:    General: Skin is warm and  dry.     Capillary Refill: Capillary refill takes less than 2 seconds.     Findings: Bruising present.  Neurological:     Mental Status: She is oriented to person, place, and time.     UC Treatments / Results  Labs (all labs ordered are listed, but only abnormal results are displayed) Labs Reviewed - No data to display  EKG  Radiology DG Finger Ring Left  Result Date: 07/24/2023 CLINICAL DATA:  Left ring finger pain EXAM: LEFT RING FINGER 2+V COMPARISON:  11/23/2018 FINDINGS: No acute fracture. No malalignment. Mild joint space narrowing of the PIP joint of the left ring finger. There are 2 marginal erosions within the proximal phalanx of the ring finger at the PIP joint level, progressed since 2019. There is mild periarticular soft tissue swelling at the PIP joint. No abnormal soft tissue mineralization. IMPRESSION: Mild joint space narrowing of the PIP joint of the left ring finger with 2 marginal erosions within the proximal phalanx of the ring finger at the PIP joint level, progressed since 2019. Findings are suggestive of an inflammatory or crystalline arthropathy. Electronically Signed   By: Duanne Guess D.O.   On: 07/24/2023 10:52    Procedures Procedures   Medications Ordered in UC Medications - No data to display  Initial Impression / Assessment and Plan / UC Course  I have reviewed the triage vital signs and the nursing notes.  Pertinent labs & imaging results that were available during my care of the patient were reviewed by me and considered in my medical decision making (see chart for details).  Left hand ring finger xray is negative for fracture.  There is mild joint space narrowing at PIP joint, radiology suspects inflammatory or crystalline arthropathy. There is no pain or swelling here on exam. No concern  for relationship to current injury. Will follow with primary care or her prior orthopedic specialist if needed.  Discussed pain control with ibuprofen/tylenol Can apply ice Discussion that nail may or may not fall off. If so keep protected/clean. Wrapped with nonstick gauze and coban for protection and comfort. All questions answered. Return precautions discussed.   Final Clinical Impressions(s) / UC Diagnoses   Final diagnoses:  Injury of finger of left hand, initial encounter     Discharge Instructions      The xray does not show any fracture. There is some mild joint space narrowing that is not related to your injury. You can follow up with your primary care provider or orthopedic specialist if the finger is still bothering you or pain gets worse.  I recommend to continue ibuprofen and/or tylenol for pain. It may be sore for the next several days. Apply ice to the finger, protected by towel or cloth. Use for 10-15 minutes a few times daily. Elevate the hand to reduce swelling and the throbbing sensation.  The nail may or may not fall off on its own, although it seems your nailbed is intact and should continue to grow out normally.  If nail does fall off, keep the area clean and dry. I recommend a bandage to protect the underlying soft tissue. The nail can grow back normally in most cases.  Please return with any questions or concerns      ED Prescriptions   None    PDMP not reviewed this encounter.   Marlow Baars, [place] 07/24/23 1102

## 2024-12-06 ENCOUNTER — Other Ambulatory Visit (HOSPITAL_COMMUNITY): Payer: Self-pay | Admitting: Obstetrics and Gynecology

## 2024-12-06 DIAGNOSIS — M810 Age-related osteoporosis without current pathological fracture: Secondary | ICD-10-CM | POA: Insufficient documentation

## 2024-12-10 ENCOUNTER — Telehealth: Payer: Self-pay

## 2024-12-10 NOTE — Addendum Note (Signed)
 Addended by: DAYNE SHERRY RAMAN on: 12/10/2024 07:34 AM   Modules accepted: Orders

## 2024-12-10 NOTE — Telephone Encounter (Signed)
 Dr. Latisha, PA has been denied. Please see reasoning listed on denial letter in the media tab.  Auth Submission: DENIED Site of care: Site of care: [facility] Payer: [employer] commercial Medication & CPT/J Code(s) submitted: Evenity (Romosozumab) [id number] Diagnosis Code:  Route of submission (phone, fax, portal): Latent  Authorization has been DENIED. There are 5 reasons listed on the denial letter. I have attached the denial letter to the media tab.

## 2024-12-10 NOTE — Telephone Encounter (Addendum)
 Evenity treatment plan discontinued.
